# Patient Record
Sex: Female | Born: 1960 | Race: White | Hispanic: No | Marital: Married | Smoking: Never smoker
Health system: Southern US, Community
[De-identification: ages and names within clinical notes are randomized; demographics above are authoritative.]

## PROBLEM LIST (undated history)

## (undated) DIAGNOSIS — F32A Depression, unspecified: Secondary | ICD-10-CM

## (undated) DIAGNOSIS — R519 Headache, unspecified: Secondary | ICD-10-CM

## (undated) DIAGNOSIS — F329 Major depressive disorder, single episode, unspecified: Secondary | ICD-10-CM

## (undated) DIAGNOSIS — B019 Varicella without complication: Secondary | ICD-10-CM

## (undated) DIAGNOSIS — F419 Anxiety disorder, unspecified: Secondary | ICD-10-CM

## (undated) DIAGNOSIS — I10 Essential (primary) hypertension: Secondary | ICD-10-CM

## (undated) DIAGNOSIS — K921 Melena: Secondary | ICD-10-CM

## (undated) DIAGNOSIS — M1990 Unspecified osteoarthritis, unspecified site: Secondary | ICD-10-CM

## (undated) DIAGNOSIS — T7840XA Allergy, unspecified, initial encounter: Secondary | ICD-10-CM

## (undated) DIAGNOSIS — R32 Unspecified urinary incontinence: Secondary | ICD-10-CM

## (undated) DIAGNOSIS — E785 Hyperlipidemia, unspecified: Secondary | ICD-10-CM

## (undated) DIAGNOSIS — R51 Headache: Secondary | ICD-10-CM

## (undated) DIAGNOSIS — R112 Nausea with vomiting, unspecified: Secondary | ICD-10-CM

## (undated) DIAGNOSIS — T8859XA Other complications of anesthesia, initial encounter: Secondary | ICD-10-CM

## (undated) HISTORY — DX: Headache, unspecified: R51.9

## (undated) HISTORY — PX: LEEP: SHX91

## (undated) HISTORY — PX: BREAST EXCISIONAL BIOPSY: SUR124

## (undated) HISTORY — DX: Unspecified urinary incontinence: R32

## (undated) HISTORY — DX: Unspecified osteoarthritis, unspecified site: M19.90

## (undated) HISTORY — DX: Hyperlipidemia, unspecified: E78.5

## (undated) HISTORY — DX: Essential (primary) hypertension: I10

## (undated) HISTORY — DX: Melena: K92.1

## (undated) HISTORY — DX: Anxiety disorder, unspecified: F41.9

## (undated) HISTORY — DX: Allergy, unspecified, initial encounter: T78.40XA

## (undated) HISTORY — PX: WRIST SURGERY: SHX841

## (undated) HISTORY — PX: TONSILLECTOMY AND ADENOIDECTOMY: SUR1326

## (undated) HISTORY — PX: COLONOSCOPY: SHX174

## (undated) HISTORY — DX: Depression, unspecified: F32.A

## (undated) HISTORY — DX: Varicella without complication: B01.9

## (undated) HISTORY — DX: Major depressive disorder, single episode, unspecified: F32.9

## (undated) HISTORY — DX: Headache: R51

---

## 2005-02-15 HISTORY — PX: BREAST BIOPSY: SHX20

## 2014-12-20 ENCOUNTER — Encounter: Payer: Self-pay | Admitting: Internal Medicine

## 2014-12-20 ENCOUNTER — Ambulatory Visit (INDEPENDENT_AMBULATORY_CARE_PROVIDER_SITE_OTHER): Payer: Managed Care, Other (non HMO) | Admitting: Internal Medicine

## 2014-12-20 VITALS — BP 136/80 | HR 69 | Temp 98.2°F | Ht 64.25 in | Wt 160.0 lb

## 2014-12-20 DIAGNOSIS — F418 Other specified anxiety disorders: Secondary | ICD-10-CM

## 2014-12-20 DIAGNOSIS — Z0000 Encounter for general adult medical examination without abnormal findings: Secondary | ICD-10-CM

## 2014-12-20 DIAGNOSIS — R51 Headache: Secondary | ICD-10-CM | POA: Diagnosis not present

## 2014-12-20 DIAGNOSIS — G44209 Tension-type headache, unspecified, not intractable: Secondary | ICD-10-CM | POA: Insufficient documentation

## 2014-12-20 DIAGNOSIS — R519 Headache, unspecified: Secondary | ICD-10-CM

## 2014-12-20 DIAGNOSIS — R5383 Other fatigue: Secondary | ICD-10-CM

## 2014-12-20 DIAGNOSIS — F419 Anxiety disorder, unspecified: Secondary | ICD-10-CM

## 2014-12-20 DIAGNOSIS — F32A Depression, unspecified: Secondary | ICD-10-CM

## 2014-12-20 DIAGNOSIS — R32 Unspecified urinary incontinence: Secondary | ICD-10-CM

## 2014-12-20 DIAGNOSIS — F329 Major depressive disorder, single episode, unspecified: Secondary | ICD-10-CM

## 2014-12-20 DIAGNOSIS — E782 Mixed hyperlipidemia: Secondary | ICD-10-CM | POA: Insufficient documentation

## 2014-12-20 DIAGNOSIS — J302 Other seasonal allergic rhinitis: Secondary | ICD-10-CM

## 2014-12-20 DIAGNOSIS — E785 Hyperlipidemia, unspecified: Secondary | ICD-10-CM | POA: Diagnosis not present

## 2014-12-20 NOTE — Assessment & Plan Note (Signed)
Seems to be flaring now Advised her to go ahead and start her Nasonex, Allegra and Singulair

## 2014-12-20 NOTE — Assessment & Plan Note (Signed)
Seem to be sinus related Advised her to go ahead and start taking her Nasonex, Allegra and Singulair

## 2014-12-20 NOTE — Assessment & Plan Note (Signed)
Will repeat Lipid profile and CMET today Handout given on low fat, low cholesterol diet

## 2014-12-20 NOTE — Assessment & Plan Note (Signed)
Chronic but stable Will check CMET today Support offered

## 2014-12-20 NOTE — Progress Notes (Signed)
Pre visit review using our clinic review tool, if applicable. No additional management support is needed unless otherwise documented below in the visit note. 

## 2014-12-20 NOTE — Assessment & Plan Note (Signed)
>>  ASSESSMENT AND PLAN FOR FREQUENT HEADACHES WRITTEN ON 12/20/2014 12:38 PM BY Lorre Munroe, NP  Seem to be sinus related Advised her to go ahead and start taking her Nasonex, Allegra and Singulair

## 2014-12-20 NOTE — Patient Instructions (Signed)
Fat and Cholesterol Control Diet Fat and cholesterol levels in your blood and organs are influenced by your diet. High levels of fat and cholesterol may lead to diseases of the heart, small and large blood vessels, gallbladder, liver, and pancreas. CONTROLLING FAT AND CHOLESTEROL WITH DIET Although exercise and lifestyle factors are important, your diet is key. That is because certain foods are known to raise cholesterol and others to lower it. The goal is to balance foods for their effect on cholesterol and more importantly, to replace saturated and trans fat with other types of fat, such as monounsaturated fat, polyunsaturated fat, and omega-3 fatty acids. On average, a person should consume no more than 15 to 17 g of saturated fat daily. Saturated and trans fats are considered "bad" fats, and they will raise LDL cholesterol. Saturated fats are primarily found in animal products such as meats, butter, and cream. However, that does not mean you need to give up all your favorite foods. Today, there are good tasting, low-fat, low-cholesterol substitutes for most of the things you like to eat. Choose low-fat or nonfat alternatives. Choose round or loin cuts of red meat. These types of cuts are lowest in fat and cholesterol. Chicken (without the skin), fish, veal, and ground turkey breast are great choices. Eliminate fatty meats, such as hot dogs and salami. Even shellfish have little or no saturated fat. Have a 3 oz (85 g) portion when you eat lean meat, poultry, or fish. Trans fats are also called "partially hydrogenated oils." They are oils that have been scientifically manipulated so that they are solid at room temperature resulting in a longer shelf life and improved taste and texture of foods in which they are added. Trans fats are found in stick margarine, some tub margarines, cookies, crackers, and baked goods.  When baking and cooking, oils are a great substitute for butter. The monounsaturated oils are  especially beneficial since it is believed they lower LDL and raise HDL. The oils you should avoid entirely are saturated tropical oils, such as coconut and palm.  Remember to eat a lot from food groups that are naturally free of saturated and trans fat, including fish, fruit, vegetables, beans, grains (barley, rice, couscous, bulgur wheat), and pasta (without cream sauces).  IDENTIFYING FOODS THAT LOWER FAT AND CHOLESTEROL  Soluble fiber may lower your cholesterol. This type of fiber is found in fruits such as apples, vegetables such as broccoli, potatoes, and carrots, legumes such as beans, peas, and lentils, and grains such as barley. Foods fortified with plant sterols (phytosterol) may also lower cholesterol. You should eat at least 2 g per day of these foods for a cholesterol lowering effect.  Read package labels to identify low-saturated fats, trans fat free, and low-fat foods at the supermarket. Select cheeses that have only 2 to 3 g saturated fat per ounce. Use a heart-healthy tub margarine that is free of trans fats or partially hydrogenated oil. When buying baked goods (cookies, crackers), avoid partially hydrogenated oils. Breads and muffins should be made from whole grains (whole-wheat or whole oat flour, instead of "flour" or "enriched flour"). Buy non-creamy canned soups with reduced salt and no added fats.  FOOD PREPARATION TECHNIQUES  Never deep-fry. If you must fry, either stir-fry, which uses very little fat, or use non-stick cooking sprays. When possible, broil, bake, or roast meats, and steam vegetables. Instead of putting butter or margarine on vegetables, use lemon and herbs, applesauce, and cinnamon (for squash and sweet potatoes). Use nonfat   yogurt, salsa, and low-fat dressings for salads.  LOW-SATURATED FAT / LOW-FAT FOOD SUBSTITUTES Meats / Saturated Fat (g)  Avoid: Steak, marbled (3 oz/85 g) / 11 g  Choose: Steak, lean (3 oz/85 g) / 4 g  Avoid: Hamburger (3 oz/85 g) / 7  g  Choose: Hamburger, lean (3 oz/85 g) / 5 g  Avoid: Ham (3 oz/85 g) / 6 g  Choose: Ham, lean cut (3 oz/85 g) / 2.4 g  Avoid: Chicken, with skin, dark meat (3 oz/85 g) / 4 g  Choose: Chicken, skin removed, dark meat (3 oz/85 g) / 2 g  Avoid: Chicken, with skin, light meat (3 oz/85 g) / 2.5 g  Choose: Chicken, skin removed, light meat (3 oz/85 g) / 1 g Dairy / Saturated Fat (g)  Avoid: Whole milk (1 cup) / 5 g  Choose: Low-fat milk, 2% (1 cup) / 3 g  Choose: Low-fat milk, 1% (1 cup) / 1.5 g  Choose: Skim milk (1 cup) / 0.3 g  Avoid: Hard cheese (1 oz/28 g) / 6 g  Choose: Skim milk cheese (1 oz/28 g) / 2 to 3 g  Avoid: Cottage cheese, 4% fat (1 cup) / 6.5 g  Choose: Low-fat cottage cheese, 1% fat (1 cup) / 1.5 g  Avoid: Ice cream (1 cup) / 9 g  Choose: Sherbet (1 cup) / 2.5 g  Choose: Nonfat frozen yogurt (1 cup) / 0.3 g  Choose: Frozen fruit bar / trace  Avoid: Whipped cream (1 tbs) / 3.5 g  Choose: Nondairy whipped topping (1 tbs) / 1 g Condiments / Saturated Fat (g)  Avoid: Mayonnaise (1 tbs) / 2 g  Choose: Low-fat mayonnaise (1 tbs) / 1 g  Avoid: Butter (1 tbs) / 7 g  Choose: Extra light margarine (1 tbs) / 1 g  Avoid: Coconut oil (1 tbs) / 11.8 g  Choose: Olive oil (1 tbs) / 1.8 g  Choose: Corn oil (1 tbs) / 1.7 g  Choose: Safflower oil (1 tbs) / 1.2 g  Choose: Sunflower oil (1 tbs) / 1.4 g  Choose: Soybean oil (1 tbs) / 2.4 g  Choose: Canola oil (1 tbs) / 1 g Document Released: 09/03/2005 Document Revised: 12/29/2012 Document Reviewed: 12/02/2013 [employer] Patient Information 2015 [employer]. This information is not intended to replace advice given to you by your health care provider. Make sure you discuss any questions you have with your health care provider.  

## 2014-12-20 NOTE — Assessment & Plan Note (Signed)
Continue Ditropan for now

## 2014-12-20 NOTE — Progress Notes (Signed)
HPI  Pt presents to the clinic today to establish care and for management of the conditions listed below. She recently moved from [place]. She is transferring her care from [place].  Seasonal Allergies: Worse in the Spring and Fall. She does take Allegra, Nasonex and Singulair but only when needed.  Frequent Headaches: Occuring almost daily right now. She thinks it is sinus related. The headache is located in her forehead and behind her eyes. She describes it as pressure. She denies blurred vision, dizziness, nausea or sensitivity to light or sound. She takes Ibuprofen and her allergy medications with good relief.  Urine Incontinence: Controlled taking Ditropan daily.  Anxiety and Depression. Chronic but stable. She takes Zoloft daily. It does take the edge off, and keeps her from having crying spells over little things. Denies SI/HI.  HLD: She denies myalgias on Zocor. She does try to consume a low fat diet.  Flu: never Tetanus: 2009 Pap Smear: 03/2014- normal Mammogram: 02/2014 Colon Screening: 03/2012- Internal hemorrhoids Vision Screening: 11/2013 Dentist: biannually  Past Medical History  Diagnosis Date  . Allergy   . Chicken pox   . Blood in stool   . Frequent headaches   . Urine incontinence     Current Outpatient Prescriptions  Medication Sig Dispense Refill  . mometasone (NASONEX) 50 MCG/ACT nasal spray Place 2 sprays into the nose daily.    . montelukast (SINGULAIR) 10 MG tablet Take 10 mg by mouth at bedtime.     Marland Kitchen oxybutynin (DITROPAN-XL) 10 MG 24 hr tablet Take 10 mg by mouth at bedtime.     . sertraline (ZOLOFT) 50 MG tablet Take 50 mg by mouth at bedtime.     . simvastatin (ZOCOR) 20 MG tablet Take 20 mg by mouth daily at 6 PM.      No current facility-administered medications for this visit.    No Known Allergies  Family History  Problem Relation Age of Onset  . Arthritis Mother   . Hyperlipidemia Mother   . Hypertension Mother   . Cancer Father      Lung  . Cancer Maternal Uncle     Prostate  . Rheum arthritis Maternal Grandmother   . Stroke Maternal Grandmother   . Alcohol abuse Maternal Grandfather     History   Social History  . Marital Status: Single    Spouse Name: N/A  . Number of Children: N/A  . Years of Education: N/A   Occupational History  . Not on file.   Social History Main Topics  . Smoking status: Never Smoker   . Smokeless tobacco: Not on file  . Alcohol Use: 0.0 oz/week    0 Standard drinks or equivalent per week     Comment: occasional  . Drug Use: Not on file  . Sexual Activity: Not on file   Other Topics Concern  . Not on file   Social History Narrative  . No narrative on file    ROS:  Constitutional: Pt reports fatigue. Denies fever, malaise, headache or abrupt weight changes.  HEENT: Pt reports runny nose. Denies eye pain, eye redness, ear pain, ringing in the ears, wax buildup, nasal congestion, bloody nose, or sore throat. Respiratory: Denies difficulty breathing, shortness of breath, cough or sputum production.   Cardiovascular: Denies chest pain, chest tightness, palpitations or swelling in the hands or feet.  Gastrointestinal: Denies abdominal pain, bloating, constipation, diarrhea or blood in the stool.  GU: Pt reports urinary incontinence. Denies frequency, urgency, pain with  urination, blood in urine, odor or discharge. Musculoskeletal: Denies decrease in range of motion, difficulty with gait, muscle pain or joint pain and swelling.  Skin: Denies redness, rashes, lesions or ulcercations.  Neurological: Denies dizziness, difficulty with memory, difficulty with speech or problems with balance and coordination.  Psych: Pt reports anxiety and depression. Denies SI/HI.  No other specific complaints in a complete review of systems (except as listed in HPI above).  PE:  BP 136/80 mmHg  Pulse 69  Temp(Src) 98.2 F (36.8 C) (Oral)  Ht 5' 4.25" (1.632 m)  Wt 160 lb (72.576 kg)   BMI 27.25 kg/m2  SpO2 98%  LMP  (LMP Unknown) Wt Readings from Last 3 Encounters:  12/20/14 160 lb (72.576 kg)    General: Appears her stated age, well developed, well nourished in NAD. HEENT: Head: normal shape and size; Eyes: sclera white, no icterus, conjunctiva pink; Nose: mucosa boggy and moist, septum midline; Throat/Mouth: Teeth present, mucosa pink and moist, + PND, no lesions or ulcerations noted.  Neck: Neck supple, trachea midline. No masses, lumps or thyromegaly present.  Cardiovascular: Normal rate and rhythm. S1,S2 noted.  No murmur, rubs or gallops noted. No JVD or BLE edema. No carotid bruits noted. Pulmonary/Chest: Normal effort and positive vesicular breath sounds. No respiratory distress. No wheezes, rales or ronchi noted.  Neurological: Alert and oriented. Psychiatric: Mood anxious appearing but affect normal. Behavior is normal. Judgment and thought content normal.    Assessment and Plan:  Fatigue:  Will check basic labs including a TSH  RTC in 2 months for your physical exam

## 2014-12-21 LAB — CBC WITH DIFFERENTIAL/PLATELET
BASOS ABS: 0 10*3/uL (ref 0.0–0.1)
BASOS PCT: 1 % (ref 0–1)
Eosinophils Absolute: 0.2 10*3/uL (ref 0.0–0.7)
Eosinophils Relative: 5 % (ref 0–5)
HEMATOCRIT: 38.9 % (ref 36.0–46.0)
Hemoglobin: 12.9 g/dL (ref 12.0–15.0)
LYMPHS PCT: 30 % (ref 12–46)
Lymphs Abs: 1.2 10*3/uL (ref 0.7–4.0)
MCH: 30.1 pg (ref 26.0–34.0)
MCHC: 33.2 g/dL (ref 30.0–36.0)
MCV: 90.7 fL (ref 78.0–100.0)
MONO ABS: 0.3 10*3/uL (ref 0.1–1.0)
MPV: 9.8 fL (ref 8.6–12.4)
Monocytes Relative: 8 % (ref 3–12)
Neutro Abs: 2.3 10*3/uL (ref 1.7–7.7)
Neutrophils Relative %: 56 % (ref 43–77)
Platelets: 280 10*3/uL (ref 150–400)
RBC: 4.29 MIL/uL (ref 3.87–5.11)
RDW: 13.1 % (ref 11.5–15.5)
WBC: 4.1 10*3/uL (ref 4.0–10.5)

## 2014-12-21 LAB — COMPREHENSIVE METABOLIC PANEL
ALBUMIN: 4.4 g/dL (ref 3.5–5.2)
ALK PHOS: 47 U/L (ref 39–117)
ALT: 21 U/L (ref 0–35)
AST: 27 U/L (ref 0–37)
BUN: 12 mg/dL (ref 6–23)
CHLORIDE: 105 meq/L (ref 96–112)
CO2: 28 meq/L (ref 19–32)
CREATININE: 0.69 mg/dL (ref 0.50–1.10)
Calcium: 9.1 mg/dL (ref 8.4–10.5)
Glucose, Bld: 75 mg/dL (ref 70–99)
POTASSIUM: 4.2 meq/L (ref 3.5–5.3)
Sodium: 141 mEq/L (ref 135–145)
TOTAL PROTEIN: 6.6 g/dL (ref 6.0–8.3)
Total Bilirubin: 0.5 mg/dL (ref 0.2–1.2)

## 2014-12-21 LAB — TSH: TSH: 3.519 u[IU]/mL (ref 0.350–4.500)

## 2014-12-21 LAB — LIPID PANEL
CHOLESTEROL: 177 mg/dL (ref 0–200)
HDL: 56 mg/dL (ref >=46)
LDL CALC: 97 mg/dL (ref 0–99)
TRIGLYCERIDES: 119 mg/dL (ref <150)
Total CHOL/HDL Ratio: 3.2 Ratio
VLDL: 24 mg/dL (ref 0–40)

## 2015-02-15 ENCOUNTER — Other Ambulatory Visit: Payer: Self-pay | Admitting: Internal Medicine

## 2015-02-16 ENCOUNTER — Other Ambulatory Visit: Payer: Managed Care, Other (non HMO)

## 2015-02-21 ENCOUNTER — Ambulatory Visit (INDEPENDENT_AMBULATORY_CARE_PROVIDER_SITE_OTHER): Payer: Managed Care, Other (non HMO) | Admitting: Internal Medicine

## 2015-02-21 ENCOUNTER — Encounter: Payer: Self-pay | Admitting: Internal Medicine

## 2015-02-21 VITALS — BP 132/86 | HR 63 | Temp 98.1°F | Ht 64.25 in | Wt 149.0 lb

## 2015-02-21 DIAGNOSIS — Z1239 Encounter for other screening for malignant neoplasm of breast: Secondary | ICD-10-CM

## 2015-02-21 DIAGNOSIS — Z0000 Encounter for general adult medical examination without abnormal findings: Secondary | ICD-10-CM

## 2015-02-21 NOTE — Progress Notes (Signed)
Subjective:    Patient ID: Pamela Francis, female    DOB: 29-Mar-1961, 54 y.o.   MRN: [id number]  HPI  Pt presents to the clinic today for her annual exam.  Flu: never Tetanus: 2009 Pap Smear: 03/2014- normal Mammogram: 02/2014 Colon Screening: 03/2012- Internal hemorrhoids Vision Screening: 11/2014 Dentist: biannually  Diet: She consumes a low fat diet, low salt. She tries to consume fruits and veggies. She does eat out occasionally. Exercise: None  Review of Systems      Past Medical History  Diagnosis Date  . Allergy   . Chicken pox   . Blood in stool   . Frequent headaches   . Urine incontinence     Current Outpatient Prescriptions  Medication Sig Dispense Refill  . mometasone (NASONEX) 50 MCG/ACT nasal spray Place 2 sprays into the nose daily.    . montelukast (SINGULAIR) 10 MG tablet Take 10 mg by mouth at bedtime.     Marland Kitchen oxybutynin (DITROPAN-XL) 10 MG 24 hr tablet Take 10 mg by mouth at bedtime.     . sertraline (ZOLOFT) 50 MG tablet Take 50 mg by mouth at bedtime.     . simvastatin (ZOCOR) 20 MG tablet Take 20 mg by mouth daily at 6 PM.      No current facility-administered medications for this visit.    No Known Allergies  Family History  Problem Relation Age of Onset  . Arthritis Mother   . Hyperlipidemia Mother   . Hypertension Mother   . Cancer Father     Lung  . Cancer Maternal Uncle     Prostate  . Rheum arthritis Maternal Grandmother   . Stroke Maternal Grandmother   . Alcohol abuse Maternal Grandfather     History   Social History  . Marital Status: Single    Spouse Name: N/A  . Number of Children: N/A  . Years of Education: N/A   Occupational History  . Not on file.   Social History Main Topics  . Smoking status: Never Smoker   . Smokeless tobacco: Not on file  . Alcohol Use: 0.0 oz/week    0 Standard drinks or equivalent per week     Comment: occasional  . Drug Use: No  . Sexual Activity: Yes   Other Topics Concern  . Not  on file   Social History Narrative  . No narrative on file     Constitutional: Denies fever, malaise, fatigue, headache or abrupt weight changes.  HEENT: Denies eye pain, eye redness, ear pain, ringing in the ears, wax buildup, runny nose, nasal congestion, bloody nose, or sore throat. Respiratory: Denies difficulty breathing, shortness of breath, cough or sputum production.   Cardiovascular: Denies chest pain, chest tightness, palpitations or swelling in the hands or feet.  Gastrointestinal: Pt reports alternating constipation and diarrhea. Denies abdominal pain, bloating, or blood in the stool.  GU: Pt reports urine leakage with coughing/sneezing. Denies urgency, frequency, pain with urination, burning sensation, blood in urine, odor or discharge. Musculoskeletal: Pt reports occasional neck pain.Denies decrease in range of motion, difficulty with gait, muscle pain or joint swelling.  Skin: Denies redness, rashes, lesions or ulcercations.  Neurological: Denies dizziness, difficulty with memory, difficulty with speech or problems with balance and coordination.  Psych: Denies anxiety, depression, SI/HI.  No other specific complaints in a complete review of systems (except as listed in HPI above).  Objective:   Physical Exam  BP 132/86 mmHg  Pulse 63  Temp(Src) 98.1 F (36.7 C) (  Oral)  Ht 5' 4.25" (1.632 m)  Wt 149 lb (67.586 kg)  BMI 25.38 kg/m2  SpO2 99%  LMP  (LMP Unknown) Wt Readings from Last 3 Encounters:  02/21/15 149 lb (67.586 kg)  12/20/14 160 lb (72.576 kg)    General: Appears her stated age, well developed, well nourished in NAD. Skin: Warm, dry and intact. No rashes, lesions or ulcerations noted. HEENT: Head: normal shape and size; Eyes: sclera white, no icterus, conjunctiva pink, PERRLA and EOMs intact; Ears: Tm's gray and intact, normal light reflex; Throat/Mouth: Teeth present, mucosa pink and moist, no exudate, lesions or ulcerations noted.  Neck: Neck supple,  trachea midline. No masses, lumps or thyromegaly present.  Cardiovascular: Normal rate and rhythm. S1,S2 noted.  No murmur, rubs or gallops noted. No JVD or BLE edema. No carotid bruits noted. Pulmonary/Chest: Normal effort and positive vesicular breath sounds. No respiratory distress. No wheezes, rales or ronchi noted.  Abdomen: Soft and nontender. Normal bowel sounds, no bruits noted. No distention or masses noted. Liver, spleen and kidneys non palpable. Musculoskeletal: Strength 5/5 BUE/BLE. No signs of joint swelling. No difficulty with gait.  Neurological: Alert and oriented. Cranial nerves II-XII grossly intact. Coordination normal.  Psychiatric: Mood and affect normal. Behavior is normal. Judgment and thought content normal.    BMET    Component Value Date/Time   NA 141 12/20/2014 1019   K 4.2 12/20/2014 1019   CL 105 12/20/2014 1019   CO2 28 12/20/2014 1019   GLUCOSE 75 12/20/2014 1019   BUN 12 12/20/2014 1019   CREATININE 0.69 12/20/2014 1019   CALCIUM 9.1 12/20/2014 1019    Lipid Panel     Component Value Date/Time   CHOL 177 12/20/2014 1019   TRIG 119 12/20/2014 1019   HDL 56 12/20/2014 1019   CHOLHDL 3.2 12/20/2014 1019   VLDL 24 12/20/2014 1019   LDLCALC 97 12/20/2014 1019    CBC    Component Value Date/Time   WBC 4.1 12/20/2014 1019   RBC 4.29 12/20/2014 1019   HGB 12.9 12/20/2014 1019   HCT 38.9 12/20/2014 1019   PLT 280 12/20/2014 1019   MCV 90.7 12/20/2014 1019   MCH 30.1 12/20/2014 1019   MCHC 33.2 12/20/2014 1019   RDW 13.1 12/20/2014 1019   LYMPHSABS 1.2 12/20/2014 1019   MONOABS 0.3 12/20/2014 1019   EOSABS 0.2 12/20/2014 1019   BASOSABS 0.0 12/20/2014 1019    Hgb A1C No results found for: HGBA1C       Assessment & Plan:   Preventative Health Maintenance:  She declines flu shot Tetanus UTD Pap smear due in 2018 Will order Mammogram, she will call [facility] to schedule Colonoscopy UTD Advised her to schedule an appt for a yearly eye  exam Continue to see a dentist biannually CBC, CMET and Lipid Profile reviewed from 12/2014- all normal  RTC in 6 months to follow up chronic condtions

## 2015-02-21 NOTE — Progress Notes (Signed)
Pre visit review using our clinic review tool, if applicable. No additional management support is needed unless otherwise documented below in the visit note. 

## 2015-02-21 NOTE — Patient Instructions (Addendum)
I have ordered your Mammogram, please call to schedule All of your labs look good! Schedule an appointment for a yearly eye exam  Health Maintenance Adopting a healthy lifestyle and getting preventive care can go a long way to promote health and wellness. Talk with your health care provider about what schedule of regular examinations is right for you. This is a good chance for you to check in with your provider about disease prevention and staying healthy. In between checkups, there are plenty of things you can do on your own. Experts have done a lot of research about which lifestyle changes and preventive measures are most likely to keep you healthy. Ask your health care provider for more information. WEIGHT AND DIET  Eat a healthy diet  Be sure to include plenty of vegetables, fruits, low-fat dairy products, and lean protein.  Do not eat a lot of foods high in solid fats, added sugars, or salt.  Get regular exercise. This is one of the most important things you can do for your health.  Most adults should exercise for at least 150 minutes each week. The exercise should increase your heart rate and make you sweat (moderate-intensity exercise).  Most adults should also do strengthening exercises at least twice a week. This is in addition to the moderate-intensity exercise.  Maintain a healthy weight  Body mass index (BMI) is a measurement that can be used to identify possible weight problems. It estimates body fat based on height and weight. Your health care provider can help determine your BMI and help you achieve or maintain a healthy weight.  For females 39 years of age and older:   A BMI below 18.5 is considered underweight.  A BMI of 18.5 to 24.9 is normal.  A BMI of 25 to 29.9 is considered overweight.  A BMI of 30 and above is considered obese.  Watch levels of cholesterol and blood lipids  You should start having your blood tested for lipids and cholesterol at 52 years of  age, then have this test every 5 years.  You may need to have your cholesterol levels checked more often if:  Your lipid or cholesterol levels are high.  You are older than 52 years of age.  You are at high risk for heart disease.  CANCER SCREENING   Lung Cancer  Lung cancer screening is recommended for adults 70-45 years old who are at high risk for lung cancer because of a history of smoking.  A yearly low-dose CT scan of the lungs is recommended for people who:  Currently smoke.  Have quit within the past 15 years.  Have at least a 30-pack-year history of smoking. A pack year is smoking an average of one pack of cigarettes a day for 1 year.  Yearly screening should continue until it has been 15 years since you quit.  Yearly screening should stop if you develop a health problem that would prevent you from having lung cancer treatment.  Breast Cancer  Practice breast self-awareness. This means understanding how your breasts normally appear and feel.  It also means doing regular breast self-exams. Let your health care provider know about any changes, no matter how small.  If you are in your 20s or 30s, you should have a clinical breast exam (CBE) by a health care provider every 1-3 years as part of a regular health exam.  If you are 78 or older, have a CBE every year. Also consider having a breast X-ray (mammogram) every  year.  If you have a family history of breast cancer, talk to your health care provider about genetic screening.  If you are at high risk for breast cancer, talk to your health care provider about having an MRI and a mammogram every year.  Breast cancer gene (BRCA) assessment is recommended for women who have family members with BRCA-related cancers. BRCA-related cancers include:  Breast.  Ovarian.  Tubal.  Peritoneal cancers.  Results of the assessment will determine the need for genetic counseling and BRCA1 and BRCA2 testing. Cervical  Cancer Routine pelvic examinations to screen for cervical cancer are no longer recommended for nonpregnant women who are considered low risk for cancer of the pelvic organs (ovaries, uterus, and vagina) and who do not have symptoms. A pelvic examination may be necessary if you have symptoms including those associated with pelvic infections. Ask your health care provider if a screening pelvic exam is right for you.   The Pap test is the screening test for cervical cancer for women who are considered at risk.  If you had a hysterectomy for a problem that was not cancer or a condition that could lead to cancer, then you no longer need Pap tests.  If you are older than 65 years, and you have had normal Pap tests for the past 10 years, you no longer need to have Pap tests.  If you have had past treatment for cervical cancer or a condition that could lead to cancer, you need Pap tests and screening for cancer for at least 20 years after your treatment.  If you no longer get a Pap test, assess your risk factors if they change (such as having a new sexual partner). This can affect whether you should start being screened again.  Some women have medical problems that increase their chance of getting cervical cancer. If this is the case for you, your health care provider may recommend more frequent screening and Pap tests.  The human papillomavirus (HPV) test is another test that may be used for cervical cancer screening. The HPV test looks for the virus that can cause cell changes in the cervix. The cells collected during the Pap test can be tested for HPV.  The HPV test can be used to screen women 30 years of age and older. Getting tested for HPV can extend the interval between normal Pap tests from three to five years.  An HPV test also should be used to screen women of any age who have unclear Pap test results.  After 52 years of age, women should have HPV testing as often as Pap tests.  Colorectal  Cancer  This type of cancer can be detected and often prevented.  Routine colorectal cancer screening usually begins at 52 years of age and continues through 52 years of age.  Your health care provider may recommend screening at an earlier age if you have risk factors for colon cancer.  Your health care provider may also recommend using home test kits to check for hidden blood in the stool.  A small camera at the end of a tube can be used to examine your colon directly (sigmoidoscopy or colonoscopy). This is done to check for the earliest forms of colorectal cancer.  Routine screening usually begins at age 50.  Direct examination of the colon should be repeated every 5-10 years through 52 years of age. However, you may need to be screened more often if early forms of precancerous polyps or small growths are found.   Skin Cancer  Check your skin from head to toe regularly.  Tell your health care provider about any new moles or changes in moles, especially if there is a change in a mole's shape or color.  Also tell your health care provider if you have a mole that is larger than the size of a pencil eraser.  Always use sunscreen. Apply sunscreen liberally and repeatedly throughout the day.  Protect yourself by wearing long sleeves, pants, a wide-brimmed hat, and sunglasses whenever you are outside. HEART DISEASE, DIABETES, AND HIGH BLOOD PRESSURE   Have your blood pressure checked at least every 1-2 years. High blood pressure causes heart disease and increases the risk of stroke.  If you are between 78 years and 46 years old, ask your health care provider if you should take aspirin to prevent strokes.  Have regular diabetes screenings. This involves taking a blood sample to check your fasting blood sugar level.  If you are at a normal weight and have a low risk for diabetes, have this test once every three years after 52 years of age.  If you are overweight and have a high risk for  diabetes, consider being tested at a younger age or more often. PREVENTING INFECTION  Hepatitis B  If you have a higher risk for hepatitis B, you should be screened for this virus. You are considered at high risk for hepatitis B if:  You were born in a country where hepatitis B is common. Ask your health care provider which countries are considered high risk.  Your parents were born in a high-risk country, and you have not been immunized against hepatitis B (hepatitis B vaccine).  You have HIV or AIDS.  You use needles to inject street drugs.  You live with someone who has hepatitis B.  You have had sex with someone who has hepatitis B.  You get hemodialysis treatment.  You take certain medicines for conditions, including cancer, organ transplantation, and autoimmune conditions. Hepatitis C  Blood testing is recommended for:  Everyone born from [birth year] through 1963.  Anyone with known risk factors for hepatitis C. Sexually transmitted infections (STIs)  You should be screened for sexually transmitted infections (STIs) including gonorrhea and chlamydia if:  You are sexually active and are younger than 52 years of age.  You are older than 52 years of age and your health care provider tells you that you are at risk for this type of infection.  Your sexual activity has changed since you were last screened and you are at an increased risk for chlamydia or gonorrhea. Ask your health care provider if you are at risk.  If you do not have HIV, but are at risk, it may be recommended that you take a prescription medicine daily to prevent HIV infection. This is called pre-exposure prophylaxis (PrEP). You are considered at risk if:  You are sexually active and do not regularly use condoms or know the HIV status of your partner(s).  You take drugs by injection.  You are sexually active with a partner who has HIV. Talk with your health care provider about whether you are at high risk of  being infected with HIV. If you choose to begin PrEP, you should first be tested for HIV. You should then be tested every 3 months for as long as you are taking PrEP.  PREGNANCY   If you are premenopausal and you may become pregnant, ask your health care provider about preconception counseling.  If you  may become pregnant, take 400 to 800 micrograms (mcg) of folic acid every day.  If you want to prevent pregnancy, talk to your health care provider about birth control (contraception). OSTEOPOROSIS AND MENOPAUSE   Osteoporosis is a disease in which the bones lose minerals and strength with aging. This can result in serious bone fractures. Your risk for osteoporosis can be identified using a bone density scan.  If you are 59 years of age or older, or if you are at risk for osteoporosis and fractures, ask your health care provider if you should be screened.  Ask your health care provider whether you should take a calcium or vitamin D supplement to lower your risk for osteoporosis.  Menopause may have certain physical symptoms and risks.  Hormone replacement therapy may reduce some of these symptoms and risks. Talk to your health care provider about whether hormone replacement therapy is right for you.  HOME CARE INSTRUCTIONS   Schedule regular health, dental, and eye exams.  Stay current with your immunizations.   Do not use any tobacco products including cigarettes, chewing tobacco, or electronic cigarettes.  If you are pregnant, do not drink alcohol.  If you are breastfeeding, limit how much and how often you drink alcohol.  Limit alcohol intake to no more than 1 drink per day for nonpregnant women. One drink equals 12 ounces of beer, 5 ounces of wine, or 1 ounces of hard liquor.  Do not use street drugs.  Do not share needles.  Ask your health care provider for help if you need support or information about quitting drugs.  Tell your health care provider if you often feel  depressed.  Tell your health care provider if you have ever been abused or do not feel safe at home. Document Released: 03/19/2011 Document Revised: 01/18/2014 Document Reviewed: 08/05/2013 [facility] Patient Information 2015 [place], [place]. This information is not intended to replace advice given to you by your health care provider. Make sure you discuss any questions you have with your health care provider.

## 2015-02-23 ENCOUNTER — Other Ambulatory Visit: Payer: Self-pay | Admitting: Internal Medicine

## 2015-03-09 ENCOUNTER — Ambulatory Visit
Admission: RE | Admit: 2015-03-09 | Discharge: 2015-03-09 | Disposition: A | Discharge status: Home / Self Care | Payer: Managed Care, Other (non HMO) | Source: Ambulatory Visit | Attending: Internal Medicine | Admitting: Internal Medicine

## 2015-03-09 DIAGNOSIS — Z1231 Encounter for screening mammogram for malignant neoplasm of breast: Secondary | ICD-10-CM | POA: Insufficient documentation

## 2015-03-09 DIAGNOSIS — Z1239 Encounter for other screening for malignant neoplasm of breast: Secondary | ICD-10-CM

## 2015-03-21 ENCOUNTER — Other Ambulatory Visit: Payer: Self-pay | Admitting: Internal Medicine

## 2015-07-07 ENCOUNTER — Other Ambulatory Visit: Payer: Self-pay | Admitting: Internal Medicine

## 2015-08-22 ENCOUNTER — Other Ambulatory Visit: Payer: Managed Care, Other (non HMO)

## 2015-08-24 ENCOUNTER — Ambulatory Visit: Payer: Managed Care, Other (non HMO) | Admitting: Internal Medicine

## 2015-09-10 ENCOUNTER — Other Ambulatory Visit: Payer: Self-pay | Admitting: Internal Medicine

## 2015-09-13 ENCOUNTER — Other Ambulatory Visit: Payer: Self-pay | Admitting: Internal Medicine

## 2015-09-20 ENCOUNTER — Other Ambulatory Visit: Payer: Managed Care, Other (non HMO)

## 2015-09-22 ENCOUNTER — Encounter: Payer: Self-pay | Admitting: Internal Medicine

## 2015-09-22 ENCOUNTER — Ambulatory Visit (INDEPENDENT_AMBULATORY_CARE_PROVIDER_SITE_OTHER): Payer: Managed Care, Other (non HMO) | Admitting: Internal Medicine

## 2015-09-22 ENCOUNTER — Other Ambulatory Visit: Payer: Managed Care, Other (non HMO)

## 2015-09-22 VITALS — BP 122/76 | HR 74 | Temp 98.3°F | Ht 64.25 in | Wt 165.0 lb

## 2015-09-22 DIAGNOSIS — F419 Anxiety disorder, unspecified: Secondary | ICD-10-CM

## 2015-09-22 DIAGNOSIS — R32 Unspecified urinary incontinence: Secondary | ICD-10-CM

## 2015-09-22 DIAGNOSIS — Z0000 Encounter for general adult medical examination without abnormal findings: Secondary | ICD-10-CM

## 2015-09-22 DIAGNOSIS — F329 Major depressive disorder, single episode, unspecified: Secondary | ICD-10-CM

## 2015-09-22 DIAGNOSIS — K648 Other hemorrhoids: Secondary | ICD-10-CM

## 2015-09-22 DIAGNOSIS — J302 Other seasonal allergic rhinitis: Secondary | ICD-10-CM

## 2015-09-22 DIAGNOSIS — E785 Hyperlipidemia, unspecified: Secondary | ICD-10-CM

## 2015-09-22 DIAGNOSIS — R519 Headache, unspecified: Secondary | ICD-10-CM

## 2015-09-22 DIAGNOSIS — R51 Headache: Secondary | ICD-10-CM

## 2015-09-22 DIAGNOSIS — F418 Other specified anxiety disorders: Secondary | ICD-10-CM

## 2015-09-22 LAB — CBC WITH DIFFERENTIAL/PLATELET
Basophils Absolute: 0 10*3/uL (ref 0.0–0.1)
Basophils Relative: 0 % (ref 0–1)
EOS PCT: 3 % (ref 0–5)
Eosinophils Absolute: 0.3 10*3/uL (ref 0.0–0.7)
HEMATOCRIT: 37.2 % (ref 36.0–46.0)
Hemoglobin: 12.6 g/dL (ref 12.0–15.0)
LYMPHS ABS: 1.8 10*3/uL (ref 0.7–4.0)
LYMPHS PCT: 18 % (ref 12–46)
MCH: 30 pg (ref 26.0–34.0)
MCHC: 33.9 g/dL (ref 30.0–36.0)
MCV: 88.6 fL (ref 78.0–100.0)
MONO ABS: 0.6 10*3/uL (ref 0.1–1.0)
MPV: 8.5 fL — ABNORMAL LOW (ref 8.6–12.4)
Monocytes Relative: 6 % (ref 3–12)
Neutro Abs: 7.2 10*3/uL (ref 1.7–7.7)
Neutrophils Relative %: 73 % (ref 43–77)
Platelets: 415 10*3/uL — ABNORMAL HIGH (ref 150–400)
RBC: 4.2 MIL/uL (ref 3.87–5.11)
RDW: 13.4 % (ref 11.5–15.5)
WBC: 9.8 10*3/uL (ref 4.0–10.5)

## 2015-09-22 MED ORDER — HYDROCORTISONE ACETATE 25 MG RE SUPP: 25.0000 mg | Freq: Two times a day (BID) | RECTAL | Status: DC

## 2015-09-22 NOTE — Patient Instructions (Signed)
Health Maintenance, Female Adopting a healthy lifestyle and getting preventive care can go a long way to promote health and wellness. Talk with your health care provider about what schedule of regular examinations is right for you. This is a good chance for you to check in with your provider about disease prevention and staying healthy. In between checkups, there are plenty of things you can do on your own. Experts have done a lot of research about which lifestyle changes and preventive measures are most likely to keep you healthy. Ask your health care provider for more information. WEIGHT AND DIET  Eat a healthy diet  Be sure to include plenty of vegetables, fruits, low-fat dairy products, and lean protein.  Do not eat a lot of foods high in solid fats, added sugars, or salt.  Get regular exercise. This is one of the most important things you can do for your health.  Most adults should exercise for at least 150 minutes each week. The exercise should increase your heart rate and make you sweat (moderate-intensity exercise).  Most adults should also do strengthening exercises at least twice a week. This is in addition to the moderate-intensity exercise.  Maintain a healthy weight  Body mass index (BMI) is a measurement that can be used to identify possible weight problems. It estimates body fat based on height and weight. Your health care provider can help determine your BMI and help you achieve or maintain a healthy weight.  For females 20 years of age and older:   A BMI below 18.5 is considered underweight.  A BMI of 18.5 to 24.9 is normal.  A BMI of 25 to 29.9 is considered overweight.  A BMI of 30 and above is considered obese.  Watch levels of cholesterol and blood lipids  You should start having your blood tested for lipids and cholesterol at 53 years of age, then have this test every 5 years.  You may need to have your cholesterol levels checked more often if:  Your lipid  or cholesterol levels are high.  You are older than 53 years of age.  You are at high risk for heart disease.  CANCER SCREENING   Lung Cancer  Lung cancer screening is recommended for adults 55-80 years old who are at high risk for lung cancer because of a history of smoking.  A yearly low-dose CT scan of the lungs is recommended for people who:  Currently smoke.  Have quit within the past 15 years.  Have at least a 30-pack-year history of smoking. A pack year is smoking an average of one pack of cigarettes a day for 1 year.  Yearly screening should continue until it has been 15 years since you quit.  Yearly screening should stop if you develop a health problem that would prevent you from having lung cancer treatment.  Breast Cancer  Practice breast self-awareness. This means understanding how your breasts normally appear and feel.  It also means doing regular breast self-exams. Let your health care provider know about any changes, no matter how small.  If you are in your 20s or 30s, you should have a clinical breast exam (CBE) by a health care provider every 1-3 years as part of a regular health exam.  If you are 40 or older, have a CBE every year. Also consider having a breast X-ray (mammogram) every year.  If you have a family history of breast cancer, talk to your health care provider about genetic screening.  If you   are at high risk for breast cancer, talk to your health care provider about having an MRI and a mammogram every year.  Breast cancer gene (BRCA) assessment is recommended for women who have family members with BRCA-related cancers. BRCA-related cancers include:  Breast.  Ovarian.  Tubal.  Peritoneal cancers.  Results of the assessment will determine the need for genetic counseling and BRCA1 and BRCA2 testing. Cervical Cancer Your health care provider may recommend that you be screened regularly for cancer of the pelvic organs (ovaries, uterus, and  vagina). This screening involves a pelvic examination, including checking for microscopic changes to the surface of your cervix (Pap test). You may be encouraged to have this screening done every 3 years, beginning at age 21.  For women ages 30-65, health care providers may recommend pelvic exams and Pap testing every 3 years, or they may recommend the Pap and pelvic exam, combined with testing for human papilloma virus (HPV), every 5 years. Some types of HPV increase your risk of cervical cancer. Testing for HPV may also be done on women of any age with unclear Pap test results.  Other health care providers may not recommend any screening for nonpregnant women who are considered low risk for pelvic cancer and who do not have symptoms. Ask your health care provider if a screening pelvic exam is right for you.  If you have had past treatment for cervical cancer or a condition that could lead to cancer, you need Pap tests and screening for cancer for at least 20 years after your treatment. If Pap tests have been discontinued, your risk factors (such as having a new sexual partner) need to be reassessed to determine if screening should resume. Some women have medical problems that increase the chance of getting cervical cancer. In these cases, your health care provider may recommend more frequent screening and Pap tests. Colorectal Cancer  This type of cancer can be detected and often prevented.  Routine colorectal cancer screening usually begins at 53 years of age and continues through 53 years of age.  Your health care provider may recommend screening at an earlier age if you have risk factors for colon cancer.  Your health care provider may also recommend using home test kits to check for hidden blood in the stool.  A small camera at the end of a tube can be used to examine your colon directly (sigmoidoscopy or colonoscopy). This is done to check for the earliest forms of colorectal  cancer.  Routine screening usually begins at age 50.  Direct examination of the colon should be repeated every 5-10 years through 53 years of age. However, you may need to be screened more often if early forms of precancerous polyps or small growths are found. Skin Cancer  Check your skin from head to toe regularly.  Tell your health care provider about any new moles or changes in moles, especially if there is a change in a mole's shape or color.  Also tell your health care provider if you have a mole that is larger than the size of a pencil eraser.  Always use sunscreen. Apply sunscreen liberally and repeatedly throughout the day.  Protect yourself by wearing long sleeves, pants, a wide-brimmed hat, and sunglasses whenever you are outside. HEART DISEASE, DIABETES, AND HIGH BLOOD PRESSURE   High blood pressure causes heart disease and increases the risk of stroke. High blood pressure is more likely to develop in:  People who have blood pressure in the high end   of the normal range (130-139/85-89 mm Hg).  People who are overweight or obese.  People who are African American.  If you are 38-23 years of age, have your blood pressure checked every 3-5 years. If you are 61 years of age or older, have your blood pressure checked every year. You should have your blood pressure measured twice--once when you are at a hospital or clinic, and once when you are not at a hospital or clinic. Record the average of the two measurements. To check your blood pressure when you are not at a hospital or clinic, you can use:  An automated blood pressure machine at a pharmacy.  A home blood pressure monitor.  If you are between 45 years and 39 years old, ask your health care provider if you should take aspirin to prevent strokes.  Have regular diabetes screenings. This involves taking a blood sample to check your fasting blood sugar level.  If you are at a normal weight and have a low risk for diabetes,  have this test once every three years after 53 years of age.  If you are overweight and have a high risk for diabetes, consider being tested at a younger age or more often. PREVENTING INFECTION  Hepatitis B  If you have a higher risk for hepatitis B, you should be screened for this virus. You are considered at high risk for hepatitis B if:  You were born in a country where hepatitis B is common. Ask your health care provider which countries are considered high risk.  Your parents were born in a high-risk country, and you have not been immunized against hepatitis B (hepatitis B vaccine).  You have HIV or AIDS.  You use needles to inject street drugs.  You live with someone who has hepatitis B.  You have had sex with someone who has hepatitis B.  You get hemodialysis treatment.  You take certain medicines for conditions, including cancer, organ transplantation, and autoimmune conditions. Hepatitis C  Blood testing is recommended for:  Everyone born from [birth year] through 1963.  Anyone with known risk factors for hepatitis C. Sexually transmitted infections (STIs)  You should be screened for sexually transmitted infections (STIs) including gonorrhea and chlamydia if:  You are sexually active and are younger than 53 years of age.  You are older than 53 years of age and your health care provider tells you that you are at risk for this type of infection.  Your sexual activity has changed since you were last screened and you are at an increased risk for chlamydia or gonorrhea. Ask your health care provider if you are at risk.  If you do not have HIV, but are at risk, it may be recommended that you take a prescription medicine daily to prevent HIV infection. This is called pre-exposure prophylaxis (PrEP). You are considered at risk if:  You are sexually active and do not regularly use condoms or know the HIV status of your partner(s).  You take drugs by injection.  You are sexually  active with a partner who has HIV. Talk with your health care provider about whether you are at high risk of being infected with HIV. If you choose to begin PrEP, you should first be tested for HIV. You should then be tested every 3 months for as long as you are taking PrEP.  PREGNANCY   If you are premenopausal and you may become pregnant, ask your health care provider about preconception counseling.  If you may  become pregnant, take 400 to 800 micrograms (mcg) of folic acid every day.  If you want to prevent pregnancy, talk to your health care provider about birth control (contraception). OSTEOPOROSIS AND MENOPAUSE   Osteoporosis is a disease in which the bones lose minerals and strength with aging. This can result in serious bone fractures. Your risk for osteoporosis can be identified using a bone density scan.  If you are 61 years of age or older, or if you are at risk for osteoporosis and fractures, ask your health care provider if you should be screened.  Ask your health care provider whether you should take a calcium or vitamin D supplement to lower your risk for osteoporosis.  Menopause may have certain physical symptoms and risks.  Hormone replacement therapy may reduce some of these symptoms and risks. Talk to your health care provider about whether hormone replacement therapy is right for you.  HOME CARE INSTRUCTIONS   Schedule regular health, dental, and eye exams.  Stay current with your immunizations.   Do not use any tobacco products including cigarettes, chewing tobacco, or electronic cigarettes.  If you are pregnant, do not drink alcohol.  If you are breastfeeding, limit how much and how often you drink alcohol.  Limit alcohol intake to no more than 1 drink per day for nonpregnant women. One drink equals 12 ounces of beer, 5 ounces of wine, or 1 ounces of hard liquor.  Do not use street drugs.  Do not share needles.  Ask your health care provider for help if  you need support or information about quitting drugs.  Tell your health care provider if you often feel depressed.  Tell your health care provider if you have ever been abused or do not feel safe at home.   This information is not intended to replace advice given to you by your health care provider. Make sure you discuss any questions you have with your health care provider.   Document Released: 03/19/2011 Document Revised: 09/24/2014 Document Reviewed: 08/05/2013 [employer] Interactive Patient Education [employer].

## 2015-09-22 NOTE — Assessment & Plan Note (Signed)
Allergy/sinus related Continue Nasonex and Singulair

## 2015-09-22 NOTE — Progress Notes (Signed)
Pre visit review using our clinic review tool, if applicable. No additional management support is needed unless otherwise documented below in the visit note. 

## 2015-09-22 NOTE — Progress Notes (Signed)
Subjective:    Patient ID: Pamela Francis, female    DOB: 1960/11/03, 55 y.o.   MRN: [id number]  HPI  Pt presents to the clinic today for her annual exam. She is also due for follow up chronic conditions. She understands that she has a physical exam 6 months ago and that insurance may not cover this visit. She does want to have another annual exam today.  Flu: never Tetanus: 2009 Pap Smear: 03/2014- normal Mammogram: 02/2015- normal Colon Screening: 03/2012- Internal hemorrhoids, flaring up now, requesting RX Vision Screening: 11/2014 Dentist: biannually  Diet: She consumes a low fat diet, low salt. She tries to consume fruits and veggies. She does eat out occasionally. Exercise: None   Seasonal Allergies: Worse in the Spring and Fall. She does take Allegra, Nasonex and Singulair but only when needed. She does feel like her sinuses are flaring up right now. She is not getting any relief with OTC medication.  Frequent Headaches: Occuring almost daily right now. She thinks it is sinus related. The headache is located in her forehead and behind her eyes. She describes it as pressure. She denies blurred vision, dizziness, nausea or sensitivity to light or sound. She takes Ibuprofen and her allergy medications with good relief.  Urine Incontinence: Controlled taking Ditropan daily.  Anxiety and Depression. Chronic but stable. She takes Zoloft daily. It does take the edge off, and keeps her from having crying spells over little things. Denies SI/HI.  HLD: Her last LDL was 97. She denies myalgias on Zocor. She does try to consume a low fat diet.  Review of Systems      Past Medical History  Diagnosis Date  . Allergy   . Chicken pox   . Blood in stool   . Frequent headaches   . Urine incontinence     Current Outpatient Prescriptions  Medication Sig Dispense Refill  . mometasone (NASONEX) 50 MCG/ACT nasal spray Place 2 sprays into the nose daily.    . montelukast (SINGULAIR) 10 MG  tablet TAKE 1 TABLET BY MOUTH EVERY DAY IN THE EVENING 30 90 tablet 0  . oxybutynin (DITROPAN-XL) 10 MG 24 hr tablet TAKE 1 TABLET BY MOUTH EVERY DAY 30 90 tablet 0  . sertraline (ZOLOFT) 50 MG tablet TAKE 1 TABLET ONCE A DAY 90 tablet 0  . simvastatin (ZOCOR) 20 MG tablet TAKE 1 TABLET BY MOUTH IN THE EVENING 30 tablet 9   No current facility-administered medications for this visit.    No Known Allergies  Family History  Problem Relation Age of Onset  . Arthritis Mother   . Hyperlipidemia Mother   . Hypertension Mother   . Cancer Father     Lung  . Cancer Maternal Uncle     Prostate  . Rheum arthritis Maternal Grandmother   . Stroke Maternal Grandmother   . Alcohol abuse Maternal Grandfather     Social History   Social History  . Marital Status: Single    Spouse Name: N/A  . Number of Children: N/A  . Years of Education: N/A   Occupational History  . Not on file.   Social History Main Topics  . Smoking status: Never Smoker   . Smokeless tobacco: Not on file  . Alcohol Use: 0.0 oz/week    0 Standard drinks or equivalent per week     Comment: occasional  . Drug Use: No  . Sexual Activity: Yes   Other Topics Concern  . Not on file   Social  History Narrative     Constitutional: Denies fever, malaise, fatigue, headache or abrupt weight changes.  HEENT: Pt reports runny nose and sore throat. Denies eye pain, eye redness, ear pain, ringing in the ears, wax buildup, nasal congestion, bloody nose. Respiratory: Denies difficulty breathing, shortness of breath, cough or sputum production.   Cardiovascular: Denies chest pain, chest tightness, palpitations or swelling in the hands or feet.  Gastrointestinal: Pt reports alternating constipation and diarrhea. Denies abdominal pain, bloating, or blood in the stool.  GU: Pt reports urine leakage with coughing/sneezing. Denies urgency, frequency, pain with urination, burning sensation, blood in urine, odor or  discharge. Musculoskeletal: Pt reports occasional neck pain. Denies decrease in range of motion, difficulty with gait, muscle pain or joint swelling.  Skin: Denies redness, rashes, lesions or ulcercations.  Neurological: Denies dizziness, difficulty with memory, difficulty with speech or problems with balance and coordination.  Psych: Pt reports chronic anxiety or depression. Denies SI/HI.  No other specific complaints in a complete review of systems (except as listed in HPI above).  Objective:   Physical Exam  BP 122/76 mmHg  Pulse 74  Temp(Src) 98.3 F (36.8 C) (Oral)  Ht 5' 4.25" (1.632 m)  Wt 165 lb (74.844 kg)  BMI 28.10 kg/m2  SpO2 98%  LMP  (LMP Unknown)  Wt Readings from Last 3 Encounters:  02/21/15 149 lb (67.586 kg)  12/20/14 160 lb (72.576 kg)    General: Appears her stated age, well developed, well nourished in NAD. Skin: Warm, dry and intact. No rashes, lesions or ulcerations noted. HEENT: Head: normal shape and size; Eyes: sclera white, no icterus, conjunctiva pink, PERRLA and EOMs intact; Ears: Tm's gray and intact, normal light reflex; Nose: mucosa boggy and moist. Throat/Mouth: Teeth present, mucosa erythematous and moist, no exudate, lesions or ulcerations noted.  Neck: Neck supple, trachea midline. No masses, lumps or thyromegaly present.  Cardiovascular: Normal rate and rhythm. S1,S2 noted.  No murmur, rubs or gallops noted. No JVD or BLE edema. No carotid bruits noted. Pulmonary/Chest: Normal effort and positive vesicular breath sounds. No respiratory distress. No wheezes, rales or ronchi noted.  Abdomen: Soft and nontender. Normal bowel sounds, no bruits noted. No distention or masses noted. Liver, spleen and kidneys non palpable. Musculoskeletal: Strength 5/5 BUE/BLE. No signs of joint swelling. No difficulty with gait.  Neurological: Alert and oriented. Cranial nerves II-XII grossly intact. Coordination normal.  Psychiatric: Mood and affect normal. Behavior  is normal. Judgment and thought content normal.    BMET    Component Value Date/Time   NA 141 12/20/2014 1019   K 4.2 12/20/2014 1019   CL 105 12/20/2014 1019   CO2 28 12/20/2014 1019   GLUCOSE 75 12/20/2014 1019   BUN 12 12/20/2014 1019   CREATININE 0.69 12/20/2014 1019   CALCIUM 9.1 12/20/2014 1019    Lipid Panel     Component Value Date/Time   CHOL 177 12/20/2014 1019   TRIG 119 12/20/2014 1019   HDL 56 12/20/2014 1019   CHOLHDL 3.2 12/20/2014 1019   VLDL 24 12/20/2014 1019   LDLCALC 97 12/20/2014 1019    CBC    Component Value Date/Time   WBC 4.1 12/20/2014 1019   RBC 4.29 12/20/2014 1019   HGB 12.9 12/20/2014 1019   HCT 38.9 12/20/2014 1019   PLT 280 12/20/2014 1019   MCV 90.7 12/20/2014 1019   MCH 30.1 12/20/2014 1019   MCHC 33.2 12/20/2014 1019   RDW 13.1 12/20/2014 1019   LYMPHSABS 1.2  12/20/2014 1019   MONOABS 0.3 12/20/2014 1019   EOSABS 0.2 12/20/2014 1019   BASOSABS 0.0 12/20/2014 1019    Hgb A1C No results found for: HGBA1C       Assessment & Plan:   Preventative Health Maintenance:  She declines flu shot Tetanus UTD Pap smear due in 2018 Mammogram UTD Colonoscopy UTD Advised her to schedule an appt for a yearly eye exam Continue to see a dentist biannually Will check CBC, CMET, Lipid, Hep C and HIV  Today  Internal hemorrhoids:  eRx for Anusol suppositories  RTC in 6 months for followup chronic conditons

## 2015-09-22 NOTE — Assessment & Plan Note (Signed)
>>  ASSESSMENT AND PLAN FOR FREQUENT HEADACHES WRITTEN ON 09/22/2015  2:42 PM BY Lorre Munroe, NP  Allergy/sinus related Continue Nasonex and Singulair

## 2015-09-22 NOTE — Assessment & Plan Note (Signed)
Stable on current dose of Zoloft Support offered today

## 2015-09-22 NOTE — Assessment & Plan Note (Addendum)
Continue Allegra, Singulair and Nasonex 80 mg Depo IM today

## 2015-09-22 NOTE — Assessment & Plan Note (Signed)
Continue Ditropan 

## 2015-09-22 NOTE — Addendum Note (Signed)
Addended by: Ellamae Sia on: 09/22/2015 03:08 PM   Modules accepted: Orders

## 2015-09-22 NOTE — Assessment & Plan Note (Signed)
Will check CMET and Lipid profile today Continue Zocor Encouraged her to consume a low fat diet

## 2015-09-23 LAB — LIPID PANEL
Cholesterol: 158 mg/dL (ref 125–200)
HDL: 56 mg/dL (ref >=46)
LDL CALC: 85 mg/dL (ref <130)
Total CHOL/HDL Ratio: 2.8 Ratio (ref <=5.0)
Triglycerides: 86 mg/dL (ref <150)
VLDL: 17 mg/dL (ref <30)

## 2015-09-23 LAB — COMPREHENSIVE METABOLIC PANEL
ALT: 20 U/L (ref 6–29)
AST: 26 U/L (ref 10–35)
Albumin: 4.3 g/dL (ref 3.6–5.1)
Alkaline Phosphatase: 60 U/L (ref 33–130)
BILIRUBIN TOTAL: 0.3 mg/dL (ref 0.2–1.2)
BUN: 12 mg/dL (ref 7–25)
CHLORIDE: 101 mmol/L (ref 98–110)
CO2: 26 mmol/L (ref 20–31)
CREATININE: 0.78 mg/dL (ref 0.50–1.05)
Calcium: 9.4 mg/dL (ref 8.6–10.4)
Glucose, Bld: 92 mg/dL (ref 65–99)
Potassium: 4.5 mmol/L (ref 3.5–5.3)
SODIUM: 142 mmol/L (ref 135–146)
TOTAL PROTEIN: 7.2 g/dL (ref 6.1–8.1)

## 2015-09-23 LAB — HIV ANTIBODY (ROUTINE TESTING W REFLEX): HIV 1&2 Ab, 4th Generation: NONREACTIVE

## 2015-09-23 LAB — HEPATITIS C ANTIBODY: HCV AB: NEGATIVE

## 2015-09-27 MED ORDER — METHYLPREDNISOLONE ACETATE 80 MG/ML IJ SUSP
80.0000 mg | Freq: Once | INTRAMUSCULAR | Status: AC
  Administered 2015-09-22: 80 mg via INTRAMUSCULAR

## 2015-09-27 NOTE — Addendum Note (Signed)
Addended by: Lurlean Nanny on: 09/27/2015 09:58 AM   Modules accepted: Orders

## 2015-10-11 ENCOUNTER — Other Ambulatory Visit: Payer: Self-pay | Admitting: Internal Medicine

## 2015-10-12 ENCOUNTER — Other Ambulatory Visit: Payer: Self-pay | Admitting: Internal Medicine

## 2016-02-21 ENCOUNTER — Other Ambulatory Visit: Payer: Self-pay | Admitting: Internal Medicine

## 2016-02-21 DIAGNOSIS — Z1231 Encounter for screening mammogram for malignant neoplasm of breast: Secondary | ICD-10-CM

## 2016-03-13 ENCOUNTER — Ambulatory Visit
Admission: RE | Admit: 2016-03-13 | Discharge: 2016-03-13 | Disposition: A | Discharge status: Home / Self Care | Payer: Managed Care, Other (non HMO) | Source: Ambulatory Visit | Attending: Internal Medicine | Admitting: Internal Medicine

## 2016-03-13 ENCOUNTER — Other Ambulatory Visit: Payer: Self-pay | Admitting: Internal Medicine

## 2016-03-13 DIAGNOSIS — Z1231 Encounter for screening mammogram for malignant neoplasm of breast: Secondary | ICD-10-CM | POA: Insufficient documentation

## 2016-03-29 ENCOUNTER — Ambulatory Visit: Payer: Managed Care, Other (non HMO) | Admitting: Internal Medicine

## 2016-04-03 ENCOUNTER — Ambulatory Visit (INDEPENDENT_AMBULATORY_CARE_PROVIDER_SITE_OTHER): Payer: Managed Care, Other (non HMO) | Admitting: Internal Medicine

## 2016-04-03 ENCOUNTER — Encounter: Payer: Self-pay | Admitting: Internal Medicine

## 2016-04-03 VITALS — BP 120/78 | HR 62 | Temp 98.6°F | Wt 170.0 lb

## 2016-04-03 DIAGNOSIS — J302 Other seasonal allergic rhinitis: Secondary | ICD-10-CM | POA: Diagnosis not present

## 2016-04-03 DIAGNOSIS — R7989 Other specified abnormal findings of blood chemistry: Secondary | ICD-10-CM

## 2016-04-03 DIAGNOSIS — D473 Essential (hemorrhagic) thrombocythemia: Secondary | ICD-10-CM

## 2016-04-03 DIAGNOSIS — R519 Headache, unspecified: Secondary | ICD-10-CM

## 2016-04-03 DIAGNOSIS — R32 Unspecified urinary incontinence: Secondary | ICD-10-CM

## 2016-04-03 DIAGNOSIS — F418 Other specified anxiety disorders: Secondary | ICD-10-CM | POA: Diagnosis not present

## 2016-04-03 DIAGNOSIS — F32A Depression, unspecified: Secondary | ICD-10-CM

## 2016-04-03 DIAGNOSIS — R51 Headache: Secondary | ICD-10-CM | POA: Diagnosis not present

## 2016-04-03 DIAGNOSIS — F419 Anxiety disorder, unspecified: Secondary | ICD-10-CM

## 2016-04-03 DIAGNOSIS — E785 Hyperlipidemia, unspecified: Secondary | ICD-10-CM

## 2016-04-03 DIAGNOSIS — F329 Major depressive disorder, single episode, unspecified: Secondary | ICD-10-CM

## 2016-04-03 NOTE — Progress Notes (Signed)
Subjective:    Patient ID: Pamela Francis, female    DOB: 1960/10/26, 55 y.o.   MRN: [id number]  HPI  Pt presents to the clinic today for 6 month follow up of chronic conditions.  Seasonal Allergies: Worse in the Spring and Fall. She does take Allegra, Nasonex and Singulair but only when needed.   Frequent Headaches: Occuring almost daily right now. She thinks it is sinus related. The headache is located in her forehead and behind her eyes. She describes it as pressure. She denies blurred vision, dizziness, nausea or sensitivity to light or sound. She takes Ibuprofen and her allergy medications with good relief.  Urine Incontinence: Controlled taking Ditropan daily.  Anxiety and Depression. Chronic but stable. She takes Zoloft daily. It does take the edge off, and keeps her from having crying spells over little things. Denies SI/HI.  HLD: Her last LDL was 85. She denies myalgias on Zocor. She does try to consume a low fat diet.   Review of Systems      Past Medical History  Diagnosis Date  . Allergy   . Chicken pox   . Blood in stool   . Frequent headaches   . Urine incontinence     Current Outpatient Prescriptions  Medication Sig Dispense Refill  . hydrocortisone (ANUSOL-HC) 25 MG suppository Place 1 suppository (25 mg total) rectally 2 (two) times daily. 12 suppository 0  . mometasone (NASONEX) 50 MCG/ACT nasal spray Place 2 sprays into the nose daily.    . montelukast (SINGULAIR) 10 MG tablet TAKE 1 TABLET BY MOUTH EVERY DAY IN THE EVENING 90 tablet 3  . oxybutynin (DITROPAN-XL) 10 MG 24 hr tablet TAKE 1 TABLET BY MOUTH EVERY DAY 90 tablet 1  . sertraline (ZOLOFT) 50 MG tablet TAKE 1 TABLET ONCE A DAY 90 tablet 3  . simvastatin (ZOCOR) 20 MG tablet TAKE 1 TABLET BY MOUTH IN THE EVENING 30 tablet 9   No current facility-administered medications for this visit.    No Known Allergies  Family History  Problem Relation Age of Onset  . Arthritis Mother   .  Hyperlipidemia Mother   . Hypertension Mother   . Cancer Father     Lung  . Cancer Maternal Uncle     Prostate  . Rheum arthritis Maternal Grandmother   . Stroke Maternal Grandmother   . Alcohol abuse Maternal Grandfather   . Breast cancer Neg Hx     Social History   Social History  . Marital Status: Single    Spouse Name: N/A  . Number of Children: N/A  . Years of Education: N/A   Occupational History  . Not on file.   Social History Main Topics  . Smoking status: Never Smoker   . Smokeless tobacco: Not on file  . Alcohol Use: 0.0 oz/week    0 Standard drinks or equivalent per week     Comment: occasional  . Drug Use: No  . Sexual Activity: Yes   Other Topics Concern  . Not on file   Social History Narrative     Constitutional: Denies fever, malaise, fatigue, headache or abrupt weight changes.  HEENT: Denies eye pain, eye redness, ear pain, ringing in the ears, wax buildup, nasal congestion, runny nose or sore throat. Respiratory: Denies difficulty breathing, shortness of breath, cough or sputum production.   Cardiovascular: Denies chest pain, chest tightness, palpitations or swelling in the hands or feet.  Gastrointestinal: Denies abdominal pain, bloating, or blood in the stool.  GU: Pt reports urine leakage with coughing/sneezing. Denies urgency, frequency, pain with urination, burning sensation, blood in urine, odor or discharge. Musculoskeletal: Denies decrease in range of motion, difficulty with gait, muscle pain or joint swelling.  Skin: Denies redness, rashes, lesions or ulcercations.  Neurological: Denies dizziness, difficulty with memory, difficulty with speech or problems with balance and coordination.  Psych: Pt reports chronic anxiety or depression. Denies SI/HI.  No other specific complaints in a complete review of systems (except as listed in HPI above).   Objective:   Physical Exam BP 120/78 mmHg  Pulse 62  Temp(Src) 98.6 F (37 C) (Oral)  Wt  170 lb (77.111 kg)  SpO2 98%  LMP  (LMP Unknown)   General: Appears her stated age, well developed, well nourished in NAD. HEENT: Head: normal shape and size; Eyes: sclera white, no icterus, conjunctiva pink, PERRLA and EOMs intact; Ears: Tm's gray and intact, normal light reflex;  Throat/Mouth: Teeth present, mucosa erythematous and moist, no exudate, lesions or ulcerations noted.  Cardiovascular: Normal rate and rhythm. S1,S2 noted.  No murmur, rubs or gallops noted. No JVD or BLE edema. No carotid bruits noted. Pulmonary/Chest: Normal effort and positive vesicular breath sounds. No respiratory distress. No wheezes, rales or ronchi noted.  Abdomen: Soft and nontender. Normal bowel sounds Neurological: Alert and oriented.  Psychiatric: Mood and affect somewhat flat today. Behavior is normal. Judgment and thought content normal.       Assessment & Plan:   Elevated platelets:  Recheck CBC today  RTC in 6 months for your annual exam

## 2016-04-03 NOTE — Assessment & Plan Note (Signed)
>>  ASSESSMENT AND PLAN FOR FREQUENT HEADACHES WRITTEN ON 04/03/2016  8:53 AM BY Lorre Munroe, NP  Possibly allergy related Continue Allegra, Nasonex, Singulair and Ibuprofen as needed

## 2016-04-03 NOTE — Assessment & Plan Note (Signed)
Continue Ditropan Will monitor

## 2016-04-03 NOTE — Addendum Note (Signed)
Addended by: Marchia Bond on: 04/03/2016 10:42 AM   Modules accepted: Orders

## 2016-04-03 NOTE — Patient Instructions (Signed)

## 2016-04-03 NOTE — Assessment & Plan Note (Signed)
Continue Allegra, Nasonex and Singulair as needed

## 2016-04-03 NOTE — Assessment & Plan Note (Signed)
Possibly allergy related Continue Allegra, Nasonex, Singulair and Ibuprofen as needed

## 2016-04-03 NOTE — Assessment & Plan Note (Signed)
Controlled on Zoloft Wean not indicated at this time.

## 2016-04-03 NOTE — Assessment & Plan Note (Signed)
Will check Lipid Profile and CMET today Encouraged her to consume a low fat diet Continue Zocor unless directed otherwise

## 2016-04-04 LAB — CBC WITH DIFFERENTIAL/PLATELET
BASOS PCT: 0 %
Basophils Absolute: 0 cells/uL (ref 0–200)
EOS PCT: 6 %
Eosinophils Absolute: 360 cells/uL (ref 15–500)
HCT: 38.6 % (ref 35.0–45.0)
Hemoglobin: 12.6 g/dL (ref 11.7–15.5)
Lymphocytes Relative: 30 %
Lymphs Abs: 1800 cells/uL (ref 850–3900)
MCH: 29.6 pg (ref 27.0–33.0)
MCHC: 32.6 g/dL (ref 32.0–36.0)
MCV: 90.8 fL (ref 80.0–100.0)
MONOS PCT: 8 %
MPV: 9.3 fL (ref 7.5–12.5)
Monocytes Absolute: 480 cells/uL (ref 200–950)
NEUTROS ABS: 3360 {cells}/uL (ref 1500–7800)
Neutrophils Relative %: 56 %
PLATELETS: 310 10*3/uL (ref 140–400)
RBC: 4.25 MIL/uL (ref 3.80–5.10)
RDW: 12.9 % (ref 11.0–15.0)
WBC: 6 10*3/uL (ref 3.8–10.8)

## 2016-04-04 LAB — COMPREHENSIVE METABOLIC PANEL
ALK PHOS: 50 U/L (ref 33–130)
ALT: 18 U/L (ref 6–29)
AST: 24 U/L (ref 10–35)
Albumin: 4.2 g/dL (ref 3.6–5.1)
BUN: 10 mg/dL (ref 7–25)
CHLORIDE: 104 mmol/L (ref 98–110)
CO2: 25 mmol/L (ref 20–31)
Calcium: 9.2 mg/dL (ref 8.6–10.4)
Creat: 0.87 mg/dL (ref 0.50–1.05)
GLUCOSE: 92 mg/dL (ref 65–99)
Potassium: 4.3 mmol/L (ref 3.5–5.3)
SODIUM: 140 mmol/L (ref 135–146)
TOTAL PROTEIN: 6.6 g/dL (ref 6.1–8.1)
Total Bilirubin: 0.6 mg/dL (ref 0.2–1.2)

## 2016-04-04 LAB — LIPID PANEL
CHOL/HDL RATIO: 3.2 ratio (ref <=5.0)
CHOLESTEROL: 172 mg/dL (ref 125–200)
HDL: 54 mg/dL (ref >=46)
LDL Cholesterol: 91 mg/dL (ref <130)
Triglycerides: 133 mg/dL (ref <150)
VLDL: 27 mg/dL (ref <30)

## 2016-04-05 ENCOUNTER — Other Ambulatory Visit: Payer: Self-pay | Admitting: Internal Medicine

## 2016-04-09 ENCOUNTER — Other Ambulatory Visit: Payer: Self-pay | Admitting: Internal Medicine

## 2016-10-04 ENCOUNTER — Encounter: Payer: Managed Care, Other (non HMO) | Admitting: Internal Medicine

## 2016-10-10 ENCOUNTER — Encounter: Payer: Self-pay | Admitting: Internal Medicine

## 2016-10-10 ENCOUNTER — Ambulatory Visit (INDEPENDENT_AMBULATORY_CARE_PROVIDER_SITE_OTHER): Payer: BLUE CROSS/BLUE SHIELD | Admitting: Internal Medicine

## 2016-10-10 VITALS — BP 120/80 | HR 64 | Temp 97.8°F | Ht 63.75 in | Wt 152.0 lb

## 2016-10-10 DIAGNOSIS — Z0000 Encounter for general adult medical examination without abnormal findings: Secondary | ICD-10-CM

## 2016-10-10 DIAGNOSIS — Z0001 Encounter for general adult medical examination with abnormal findings: Secondary | ICD-10-CM

## 2016-10-10 LAB — LIPID PANEL
CHOL/HDL RATIO: 4
Cholesterol: 173 mg/dL (ref 0–200)
HDL: 45.1 mg/dL (ref >39.00)
LDL Cholesterol: 104 mg/dL — ABNORMAL HIGH (ref 0–99)
NonHDL: 127.44
TRIGLYCERIDES: 118 mg/dL (ref 0.0–149.0)
VLDL: 23.6 mg/dL (ref 0.0–40.0)

## 2016-10-10 LAB — COMPREHENSIVE METABOLIC PANEL
ALK PHOS: 45 U/L (ref 39–117)
ALT: 12 U/L (ref 0–35)
AST: 20 U/L (ref 0–37)
Albumin: 4.4 g/dL (ref 3.5–5.2)
BILIRUBIN TOTAL: 0.4 mg/dL (ref 0.2–1.2)
BUN: 8 mg/dL (ref 6–23)
CALCIUM: 9 mg/dL (ref 8.4–10.5)
CO2: 28 meq/L (ref 19–32)
Chloride: 107 mEq/L (ref 96–112)
Creatinine, Ser: 0.78 mg/dL (ref 0.40–1.20)
GFR: 81.9 mL/min (ref >60.00)
Glucose, Bld: 97 mg/dL (ref 70–99)
POTASSIUM: 3.7 meq/L (ref 3.5–5.1)
Sodium: 141 mEq/L (ref 135–145)
Total Protein: 7 g/dL (ref 6.0–8.3)

## 2016-10-10 LAB — CBC
HEMATOCRIT: 37.5 % (ref 36.0–46.0)
HEMOGLOBIN: 12.8 g/dL (ref 12.0–15.0)
MCHC: 34.1 g/dL (ref 30.0–36.0)
MCV: 89.7 fl (ref 78.0–100.0)
PLATELETS: 262 10*3/uL (ref 150.0–400.0)
RBC: 4.18 Mil/uL (ref 3.87–5.11)
RDW: 12.6 % (ref 11.5–15.5)
WBC: 7.3 10*3/uL (ref 4.0–10.5)

## 2016-10-10 MED ORDER — MOMETASONE FUROATE 50 MCG/ACT NA SUSP: 2.0000 | Freq: Every day | NASAL | 2 refills | Status: DC

## 2016-10-10 NOTE — Progress Notes (Signed)
Subjective:    Patient ID: Pamela Francis, female    DOB: 02-17-1961, 56 y.o.   MRN: [id number]  HPI  Pt presents to the clinic today for her annual exam.  Flu: never Tetanus: 01/2008 Pap Smear: 03/2014 Mammogram: 02/2016 Colon Screening: 03/2012 Vision Screening: 09/2016 at [place] Dentist: bianually  Diet: She does eat meat. She consumes fruits and veggies daily. She does eat some fried food. She drinks mostly iced tea or water. Exercise: None  Review of Systems      Past Medical History:  Diagnosis Date  . Allergy   . Blood in stool   . Chicken pox   . Frequent headaches   . Urine incontinence     Current Outpatient Prescriptions  Medication Sig Dispense Refill  . fexofenadine (ALLEGRA) 180 MG tablet Take 180 mg by mouth daily.    . hydrocortisone (ANUSOL-HC) 25 MG suppository Place 1 suppository (25 mg total) rectally 2 (two) times daily. 12 suppository 0  . mometasone (NASONEX) 50 MCG/ACT nasal spray Place 2 sprays into the nose daily.    . montelukast (SINGULAIR) 10 MG tablet TAKE 1 TABLET BY MOUTH EVERY DAY IN THE EVENING 90 tablet 3  . oxybutynin (DITROPAN-XL) 10 MG 24 hr tablet TAKE 1 TABLET BY MOUTH EVERY DAY 90 tablet 1  . sertraline (ZOLOFT) 50 MG tablet TAKE 1 TABLET ONCE A DAY 90 tablet 3  . simvastatin (ZOCOR) 20 MG tablet TAKE 1 TABLET BY MOUTH IN THE EVENING 30 tablet 11   No current facility-administered medications for this visit.     No Known Allergies  Family History  Problem Relation Age of Onset  . Arthritis Mother   . Hyperlipidemia Mother   . Hypertension Mother   . Cancer Father     Lung  . Cancer Maternal Uncle     Prostate  . Rheum arthritis Maternal Grandmother   . Stroke Maternal Grandmother   . Alcohol abuse Maternal Grandfather   . Breast cancer Neg Hx     Social History   Social History  . Marital status: Single    Spouse name: N/A  . Number of children: N/A  . Years of education: N/A   Occupational History    . Not on file.   Social History Main Topics  . Smoking status: Never Smoker  . Smokeless tobacco: Not on file  . Alcohol use 0.0 oz/week     Comment: occasional  . Drug use: No  . Sexual activity: Yes   Other Topics Concern  . Not on file   Social History Narrative  . No narrative on file     Constitutional: Denies fever, malaise, fatigue, headache or abrupt weight changes.  HEENT: Pt reports nasal congestion, sore throat. Denies eye pain, eye redness, ear pain, ringing in the ears, wax buildup, runny nose, bloody nose. Respiratory: Denies difficulty breathing, shortness of breath, cough or sputum production.   Cardiovascular: Denies chest pain, chest tightness, palpitations or swelling in the hands or feet.  Gastrointestinal: Pt reports intermittent reflux and loose stool. Denies abdominal pain, bloating, constipation, diarrhea or blood in the stool.  GU: Denies urgency, frequency, pain with urination, burning sensation, blood in urine, odor or discharge. Musculoskeletal: Denies decrease in range of motion, difficulty with gait, muscle pain or joint pain and swelling.  Skin: Denies redness, rashes, lesions or ulcercations.  Neurological: Denies dizziness, difficulty with memory, difficulty with speech or problems with balance and coordination.  Psych: Denies anxiety, depression, SI/HI.  No other specific complaints in a complete review of systems (except as listed in HPI above).  Objective:   Physical Exam  BP 120/80   Pulse 64   Temp 97.8 F (36.6 C) (Oral)   Ht 5' 3.75" (1.619 m)   Wt 152 lb (68.9 kg)   LMP  (LMP Unknown)   SpO2 99%   BMI 26.30 kg/m  Wt Readings from Last 3 Encounters:  10/10/16 152 lb (68.9 kg)  04/03/16 170 lb (77.1 kg)  09/22/15 165 lb (74.8 kg)    General: Appears her stated age, well developed, well nourished in NAD. Skin: Warm, dry and intact. No rashes, lesions or ulcerations noted. HEENT: Head: normal shape and size, no sinus  tenderness noted; Eyes: sclera white, no icterus, conjunctiva pink, PERRLA and EOMs intact; Ears: Tm's gray and intact, normal light reflex; Nose: mucosa pink and moist, septum midline; Throat/Mouth: Teeth present, mucosa pink and moist, no exudate, lesions or ulcerations noted.  Neck:  Neck supple, trachea midline. No masses, lumps or thyromegaly present.  Cardiovascular: Normal rate and rhythm. S1,S2 noted.  No murmur, rubs or gallops noted. No JVD or BLE edema. No carotid bruits noted. Pulmonary/Chest: Normal effort and positive vesicular breath sounds. No respiratory distress. No wheezes, rales or ronchi noted.  Abdomen: Soft and nontender. Normal bowel sounds. No distention or masses noted. Liver, spleen and kidneys non palpable. Musculoskeletal: Normal range of motion.  Strength 5/5 BUE/BLE. No difficulty with gait.  Neurological: Alert and oriented. Cranial nerves II-XII grossly intact. Coordination normal.  Psychiatric: Mood and affect normal. Behavior is normal. Judgment and thought content normal.     BMET    Component Value Date/Time   NA 141 10/10/2016 1550   K 3.7 10/10/2016 1550   CL 107 10/10/2016 1550   CO2 28 10/10/2016 1550   GLUCOSE 97 10/10/2016 1550   BUN 8 10/10/2016 1550   CREATININE 0.78 10/10/2016 1550   CREATININE 0.87 04/03/2016 1042   CALCIUM 9.0 10/10/2016 1550    Lipid Panel     Component Value Date/Time   CHOL 173 10/10/2016 1550   TRIG 118.0 10/10/2016 1550   HDL 45.10 10/10/2016 1550   CHOLHDL 4 10/10/2016 1550   VLDL 23.6 10/10/2016 1550   LDLCALC 104 (H) 10/10/2016 1550    CBC    Component Value Date/Time   WBC 7.3 10/10/2016 1550   RBC 4.18 10/10/2016 1550   HGB 12.8 10/10/2016 1550   HCT 37.5 10/10/2016 1550   PLT 262.0 10/10/2016 1550   MCV 89.7 10/10/2016 1550   MCH 29.6 04/03/2016 1042   MCHC 34.1 10/10/2016 1550   RDW 12.6 10/10/2016 1550   LYMPHSABS 1,800 04/03/2016 1042   MONOABS 480 04/03/2016 1042   EOSABS 360 04/03/2016  1042   BASOSABS 0 04/03/2016 1042    Hgb A1C No results found for: HGBA1C          Assessment & Plan:   Preventative Health Maintenance:  She declines flu shot Tetanus UTD Pap smear and mammogram UTD Colon screening UTD Encouraged her to consume a balanced diet and exercise regimen Advised her to see an eye doctor and dentist annually  RTC in 1 year, sooner if needed Webb Silversmith, NP

## 2016-10-10 NOTE — Patient Instructions (Signed)

## 2016-10-11 ENCOUNTER — Other Ambulatory Visit: Payer: Self-pay | Admitting: Internal Medicine

## 2016-10-11 ENCOUNTER — Telehealth: Payer: Self-pay

## 2016-10-11 MED ORDER — FLUTICASONE PROPIONATE 50 MCG/ACT NA SUSP
2.0000 | Freq: Every day | NASAL | 6 refills | Status: AC
Start: 2016-10-11 — End: ?

## 2016-10-11 NOTE — Telephone Encounter (Signed)
Flonase sent to [facility]

## 2016-10-11 NOTE — Telephone Encounter (Signed)
Pt left v/m; pt was seen 10/10/16 and mometasone nasal spray will cost pt $61.00; if generic flonase was sent to pharmacy cost to pt would be $3.00 pt request flonase to [facility].Please advise.

## 2016-11-21 ENCOUNTER — Other Ambulatory Visit: Payer: Self-pay | Admitting: Internal Medicine

## 2016-12-06 ENCOUNTER — Other Ambulatory Visit: Payer: Self-pay | Admitting: Internal Medicine

## 2016-12-06 DIAGNOSIS — K648 Other hemorrhoids: Secondary | ICD-10-CM

## 2016-12-06 NOTE — Telephone Encounter (Signed)
Please advise if okay to refill suppositories for internal hemorrhoids

## 2017-02-27 ENCOUNTER — Other Ambulatory Visit: Payer: Self-pay | Admitting: Internal Medicine

## 2017-02-27 DIAGNOSIS — Z1231 Encounter for screening mammogram for malignant neoplasm of breast: Secondary | ICD-10-CM

## 2017-03-18 ENCOUNTER — Ambulatory Visit
Admission: RE | Admit: 2017-03-18 | Discharge: 2017-03-18 | Disposition: A | Discharge status: Home / Self Care | Payer: BLUE CROSS/BLUE SHIELD | Source: Ambulatory Visit | Attending: Internal Medicine | Admitting: Internal Medicine

## 2017-03-18 DIAGNOSIS — Z1231 Encounter for screening mammogram for malignant neoplasm of breast: Secondary | ICD-10-CM | POA: Diagnosis not present

## 2017-08-10 ENCOUNTER — Other Ambulatory Visit: Payer: Self-pay | Admitting: Internal Medicine

## 2017-09-02 ENCOUNTER — Ambulatory Visit (INDEPENDENT_AMBULATORY_CARE_PROVIDER_SITE_OTHER): Payer: BLUE CROSS/BLUE SHIELD | Admitting: Internal Medicine

## 2017-09-02 ENCOUNTER — Other Ambulatory Visit (HOSPITAL_COMMUNITY)
Admission: RE | Admit: 2017-09-02 | Discharge: 2017-09-02 | Disposition: A | Discharge status: Home / Self Care | Payer: BLUE CROSS/BLUE SHIELD | Source: Ambulatory Visit | Attending: Internal Medicine | Admitting: Internal Medicine

## 2017-09-02 ENCOUNTER — Encounter: Payer: Self-pay | Admitting: Internal Medicine

## 2017-09-02 VITALS — BP 122/80 | HR 72 | Temp 97.7°F | Ht 63.75 in | Wt 166.4 lb

## 2017-09-02 DIAGNOSIS — R51 Headache: Secondary | ICD-10-CM | POA: Diagnosis not present

## 2017-09-02 DIAGNOSIS — Z124 Encounter for screening for malignant neoplasm of cervix: Secondary | ICD-10-CM | POA: Diagnosis not present

## 2017-09-02 DIAGNOSIS — F329 Major depressive disorder, single episode, unspecified: Secondary | ICD-10-CM

## 2017-09-02 DIAGNOSIS — Z224 Carrier of infections with a predominantly sexual mode of transmission: Secondary | ICD-10-CM | POA: Diagnosis not present

## 2017-09-02 DIAGNOSIS — Z0000 Encounter for general adult medical examination without abnormal findings: Secondary | ICD-10-CM

## 2017-09-02 DIAGNOSIS — A630 Anogenital (venereal) warts: Secondary | ICD-10-CM | POA: Diagnosis not present

## 2017-09-02 DIAGNOSIS — F419 Anxiety disorder, unspecified: Secondary | ICD-10-CM | POA: Diagnosis not present

## 2017-09-02 DIAGNOSIS — E559 Vitamin D deficiency, unspecified: Secondary | ICD-10-CM | POA: Diagnosis not present

## 2017-09-02 DIAGNOSIS — E7800 Pure hypercholesterolemia, unspecified: Secondary | ICD-10-CM

## 2017-09-02 DIAGNOSIS — Z1211 Encounter for screening for malignant neoplasm of colon: Secondary | ICD-10-CM

## 2017-09-02 DIAGNOSIS — R519 Headache, unspecified: Secondary | ICD-10-CM

## 2017-09-02 LAB — LIPID PANEL
CHOL/HDL RATIO: 3
CHOLESTEROL: 148 mg/dL (ref 0–200)
HDL: 58.4 mg/dL (ref >39.00)
LDL CALC: 69 mg/dL (ref 0–99)
NonHDL: 89.39
TRIGLYCERIDES: 101 mg/dL (ref 0.0–149.0)
VLDL: 20.2 mg/dL (ref 0.0–40.0)

## 2017-09-02 LAB — COMPREHENSIVE METABOLIC PANEL
ALBUMIN: 4.3 g/dL (ref 3.5–5.2)
ALT: 17 U/L (ref 0–35)
AST: 23 U/L (ref 0–37)
Alkaline Phosphatase: 51 U/L (ref 39–117)
BILIRUBIN TOTAL: 0.6 mg/dL (ref 0.2–1.2)
BUN: 10 mg/dL (ref 6–23)
CALCIUM: 8.9 mg/dL (ref 8.4–10.5)
CO2: 28 mEq/L (ref 19–32)
CREATININE: 0.8 mg/dL (ref 0.40–1.20)
Chloride: 104 mEq/L (ref 96–112)
GFR: 79.27 mL/min (ref >60.00)
Glucose, Bld: 94 mg/dL (ref 70–99)
Potassium: 3.8 mEq/L (ref 3.5–5.1)
SODIUM: 139 meq/L (ref 135–145)
TOTAL PROTEIN: 6.9 g/dL (ref 6.0–8.3)

## 2017-09-02 LAB — CBC
HCT: 39.5 % (ref 36.0–46.0)
Hemoglobin: 13.1 g/dL (ref 12.0–15.0)
MCHC: 33.2 g/dL (ref 30.0–36.0)
MCV: 91.8 fl (ref 78.0–100.0)
PLATELETS: 267 10*3/uL (ref 150.0–400.0)
RBC: 4.3 Mil/uL (ref 3.87–5.11)
RDW: 12.8 % (ref 11.5–15.5)
WBC: 5.7 10*3/uL (ref 4.0–10.5)

## 2017-09-02 LAB — VITAMIN D 25 HYDROXY (VIT D DEFICIENCY, FRACTURES): VITD: 24.98 ng/mL — AB (ref 30.00–100.00)

## 2017-09-02 NOTE — Assessment & Plan Note (Signed)
>>  ASSESSMENT AND PLAN FOR FREQUENT HEADACHES WRITTEN ON 09/02/2017 11:53 AM BY Lorre Munroe, NP  Seems allergy related Start taking Allegra and Flonase daily If no improvement, consider low dose Amitriptyline or Topamax

## 2017-09-02 NOTE — Assessment & Plan Note (Signed)
CMET and lipid profile today Encouraged her to consume a low fat diet Continue Simvastatin for now, will adjust if needed based on labs 

## 2017-09-02 NOTE — Progress Notes (Signed)
Subjective:    Patient ID: Pamela Francis, female    DOB: 1961/04/14, 56 y.o.   MRN: [id number]  HPI  Pt presents to the clinic today for her annual exam. She is also due to follow up chronic conditions.  Frequent Headaches: The pain is located in her forehead, just above her sinuses. These are occurring a few times per week. She is not sure what triggers them. She will take Tylenol, Advil and Excedrin as needed with some relief. She takes Environmental manager on an as needed basis.  HLD: Her last LDL was 104, 09/2016. She is taking Simvastatin as prescribed. She denies myalgias. She tries to consume a low fat diet.  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 She denies SI/HI.  OAB: She has urge incontinence. She is taking Ditropan as prescribed.  Flu: never Tetanus: 01/2008 Pap Smear: more than 5 years ago Mammogram: 03/2017 Colon Screening: 03/2012, 5 years Vision Screening: annually Dentist: biannually  Diet: She does eat meat. She consumes fruits and veggies daily. She tries to avoid fried foods. She drinks mostly water, unsweet tea. Exercise: None  Review of Systems  Past Medical History:  Diagnosis Date  . Allergy   . Blood in stool   . Chicken pox   . Frequent headaches   . Urine incontinence     Current Outpatient Medications  Medication Sig Dispense Refill  . fexofenadine (ALLEGRA) 180 MG tablet Take 180 mg by mouth daily.    . fluticasone (FLONASE) 50 MCG/ACT nasal spray Place 2 sprays into both nostrils daily. 16 g 6  . hydrocortisone (ANUSOL-HC) 25 MG suppository PLACE 1 SUPPOSITORY (25 MG TOTAL) RECTALLY 2 (TWO) TIMES DAILY. 12 suppository 0  . montelukast (SINGULAIR) 10 MG tablet TAKE 1 TABLET BY MOUTH EVERY DAY IN THE EVENING 90 tablet 2  . oxybutynin (DITROPAN-XL) 10 MG 24 hr tablet TAKE 1 TABLET BY MOUTH EVERY DAY 90 tablet 2  . sertraline (ZOLOFT) 50 MG tablet TAKE 1 TABLET ONCE A DAY 90 tablet 2  . simvastatin (ZOCOR) 20 MG  tablet TAKE 1 TABLET BY MOUTH IN THE EVENING 90 tablet 0   No current facility-administered medications for this visit.     No Known Allergies  Family History  Problem Relation Age of Onset  . Arthritis Mother   . Hyperlipidemia Mother   . Hypertension Mother   . Cancer Father        Lung  . Cancer Maternal Uncle        Prostate  . Rheum arthritis Maternal Grandmother   . Stroke Maternal Grandmother   . Alcohol abuse Maternal Grandfather   . Breast cancer Neg Hx     Social History   Socioeconomic History  . Marital status: Single    Spouse name: Not on file  . Number of children: Not on file  . Years of education: Not on file  . Highest education level: Not on file  Social Needs  . Financial resource strain: Not on file  . Food insecurity - worry: Not on file  . Food insecurity - inability: Not on file  . Transportation needs - medical: Not on file  . Transportation needs - non-medical: Not on file  Occupational History  . Not on file  Tobacco Use  . Smoking status: Never Smoker  . Smokeless tobacco: Never Used  Substance and Sexual Activity  . Alcohol use: Yes    Alcohol/week: 0.0 oz    Comment: occasional  .  Drug use: No  . Sexual activity: Yes  Other Topics Concern  . Not on file  Social History Narrative  . Not on file     Constitutional: Denies fever, malaise, fatigue, headache or abrupt weight changes.  HEENT: Denies eye pain, eye redness, ear pain, ringing in the ears, wax buildup, runny nose, nasal congestion, bloody nose, or sore throat. Respiratory: Denies difficulty breathing, shortness of breath, cough or sputum production.   Cardiovascular: Denies chest pain, chest tightness, palpitations or swelling in the hands or feet.  Gastrointestinal: Denies abdominal pain, bloating, constipation, diarrhea or blood in the stool.  GU: Pt reports urge incontinence. Denies urgency, frequency, pain with urination, burning sensation, blood in urine, odor or  discharge. Musculoskeletal: Denies decrease in range of motion, difficulty with gait, muscle pain or joint pain and swelling.  Skin: Denies redness, rashes, lesions or ulcercations.  Neurological: Denies dizziness, difficulty with memory, difficulty with speech or problems with balance and coordination.  Psych: Pt has a history of anxiety and depression. Denies SI/HI.  No other specific complaints in a complete review of systems (except as listed in HPI above).     Objective:   Physical Exam   BP 122/80   Pulse 72   Temp 97.7 F (36.5 C) (Oral)   Ht 5' 3.75" (1.619 m)   Wt 166 lb 6.4 oz (75.5 kg)   LMP  (LMP Unknown)   SpO2 97%   BMI 28.79 kg/m  Wt Readings from Last 3 Encounters:  09/02/17 166 lb 6.4 oz (75.5 kg)  10/10/16 152 lb (68.9 kg)  04/03/16 170 lb (77.1 kg)    General: Appears her stated age, in NAD. Skin: Warm, dry and intact.  HEENT: Head: normal shape and size; Eyes: sclera white, no icterus, conjunctiva pink, PERRLA and EOMs intact; Ears: Tm's gray and intact, normal light reflex; Nose: mucosa pink and moist, septum midline; Throat/Mouth: Teeth present, mucosa pink and moist, no exudate, lesions or ulcerations noted.  Neck:  Neck supple, trachea midline. No masses, lumps or thyromegaly present.  Cardiovascular: Normal rate and rhythm. S1,S2 noted.  No murmur, rubs or gallops noted. No JVD or BLE edema. No carotid bruits noted. Pulmonary/Chest: Normal effort and positive vesicular breath sounds. No respiratory distress. No wheezes, rales or ronchi noted.  Abdomen: Soft and nontender. Normal bowel sounds. No distention or masses noted. Liver, spleen and kidneys non palpable. Pelvic: Normal female anatomy. Cervix without noted changes. No CMT noted. Adnexa non palpable.  Musculoskeletal: Strength 5/5 BUE/BLE. No difficulty with gait.  Neurological: Alert and oriented. Cranial nerves II-XII grossly intact. Coordination normal.  Psychiatric: Mood and affect flat.  Behavior is normal. Judgment and thought content normal.    BMET    Component Value Date/Time   NA 141 10/10/2016 1550   K 3.7 10/10/2016 1550   CL 107 10/10/2016 1550   CO2 28 10/10/2016 1550   GLUCOSE 97 10/10/2016 1550   BUN 8 10/10/2016 1550   CREATININE 0.78 10/10/2016 1550   CREATININE 0.87 04/03/2016 1042   CALCIUM 9.0 10/10/2016 1550    Lipid Panel     Component Value Date/Time   CHOL 173 10/10/2016 1550   TRIG 118.0 10/10/2016 1550   HDL 45.10 10/10/2016 1550   CHOLHDL 4 10/10/2016 1550   VLDL 23.6 10/10/2016 1550   LDLCALC 104 (H) 10/10/2016 1550    CBC    Component Value Date/Time   WBC 7.3 10/10/2016 1550   RBC 4.18 10/10/2016 1550   HGB  12.8 10/10/2016 1550   HCT 37.5 10/10/2016 1550   PLT 262.0 10/10/2016 1550   MCV 89.7 10/10/2016 1550   MCH 29.6 04/03/2016 1042   MCHC 34.1 10/10/2016 1550   RDW 12.6 10/10/2016 1550   LYMPHSABS 1,800 04/03/2016 1042   MONOABS 480 04/03/2016 1042   EOSABS 360 04/03/2016 1042   BASOSABS 0 04/03/2016 1042    Hgb A1C No results found for: HGBA1C         Assessment & Plan:   Preventative Health Maintenance:  She declines flu shot today Tetanus due 2019 Pap smear today, she declines STD screening Mammogram UTD Referral placed to GI for screening colonoscopy Encouraged her to consume a balanced diet and exercise regimen Advised her to see an eye doctor and dentist annually Will check CBC, CMET, Lipid and Vit D today  RTC in 1 year, sooner if needed Webb Silversmith, NP

## 2017-09-02 NOTE — Assessment & Plan Note (Signed)
Chronic but stable on Sertraline She is not interested in weaning any medication at this time Will monitor

## 2017-09-02 NOTE — Addendum Note (Signed)
Addended by: Jacqualin Combes on: 09/02/2017 12:46 PM   Modules accepted: Orders

## 2017-09-02 NOTE — Patient Instructions (Signed)
Health Maintenance for Postmenopausal Women Menopause is a normal process in which your reproductive ability comes to an end. This process happens gradually over a span of months to years, usually between the ages of 22 and 9. Menopause is complete when you have missed 12 consecutive menstrual periods. It is important to talk with your health care provider about some of the most common conditions that affect postmenopausal women, such as heart disease, cancer, and bone loss (osteoporosis). Adopting a healthy lifestyle and getting preventive care can help to promote your health and wellness. Those actions can also lower your chances of developing some of these common conditions. What should I know about menopause? During menopause, you may experience a number of symptoms, such as:  Moderate-to-severe hot flashes.  Night sweats.  Decrease in sex drive.  Mood swings.  Headaches.  Tiredness.  Irritability.  Memory problems.  Insomnia.  Choosing to treat or not to treat menopausal changes is an individual decision that you make with your health care provider. What should I know about hormone replacement therapy and supplements? Hormone therapy products are effective for treating symptoms that are associated with menopause, such as hot flashes and night sweats. Hormone replacement carries certain risks, especially as you become older. If you are thinking about using estrogen or estrogen with progestin treatments, discuss the benefits and risks with your health care provider. What should I know about heart disease and stroke? Heart disease, heart attack, and stroke become more likely as you age. This may be due, in part, to the hormonal changes that your body experiences during menopause. These can affect how your body processes dietary fats, triglycerides, and cholesterol. Heart attack and stroke are both medical emergencies. There are many things that you can do to help prevent heart disease  and stroke:  Have your blood pressure checked at least every 1-2 years. High blood pressure causes heart disease and increases the risk of stroke.  If you are 54-22 years old, ask your health care provider if you should take aspirin to prevent a heart attack or a stroke.  Do not use any tobacco products, including cigarettes, chewing tobacco, or electronic cigarettes. If you need help quitting, ask your health care provider.  It is important to eat a healthy diet and maintain a healthy weight. ? Be sure to include plenty of vegetables, fruits, low-fat dairy products, and lean protein. ? Avoid eating foods that are high in solid fats, added sugars, or salt (sodium).  Get regular exercise. This is one of the most important things that you can do for your health. ? Try to exercise for at least 150 minutes each week. The type of exercise that you do should increase your heart rate and make you sweat. This is known as moderate-intensity exercise. ? Try to do strengthening exercises at least twice each week. Do these in addition to the moderate-intensity exercise.  Know your numbers.Ask your health care provider to check your cholesterol and your blood glucose. Continue to have your blood tested as directed by your health care provider.  What should I know about cancer screening? There are several types of cancer. Take the following steps to reduce your risk and to catch any cancer development as early as possible. Breast Cancer  Practice breast self-awareness. ? This means understanding how your breasts normally appear and feel. ? It also means doing regular breast self-exams. Let your health care provider know about any changes, no matter how small.  If you are 54  or older, have a clinician do a breast exam (clinical breast exam or CBE) every year. Depending on your age, family history, and medical history, it may be recommended that you also have a yearly breast X-ray (mammogram).  If you  have a family history of breast cancer, talk with your health care provider about genetic screening.  If you are at high risk for breast cancer, talk with your health care provider about having an MRI and a mammogram every year.  Breast cancer (BRCA) gene test is recommended for women who have family members with BRCA-related cancers. Results of the assessment will determine the need for genetic counseling and BRCA1 and for BRCA2 testing. BRCA-related cancers include these types: ? Breast. This occurs in males or females. ? Ovarian. ? Tubal. This may also be called fallopian tube cancer. ? Cancer of the abdominal or pelvic lining (peritoneal cancer). ? Prostate. ? Pancreatic.  Cervical, Uterine, and Ovarian Cancer Your health care provider may recommend that you be screened regularly for cancer of the pelvic organs. These include your ovaries, uterus, and vagina. This screening involves a pelvic exam, which includes checking for microscopic changes to the surface of your cervix (Pap test).  For women ages 54-65, health care providers may recommend a pelvic exam and a Pap test every three years. For women ages 54-65, they may recommend the Pap test and pelvic exam, combined with testing for human papilloma virus (HPV), every five years. Some types of HPV increase your risk of cervical cancer. Testing for HPV may also be done on women of any age who have unclear Pap test results.  Other health care providers may not recommend any screening for nonpregnant women who are considered low risk for pelvic cancer and have no symptoms. Ask your health care provider if a screening pelvic exam is right for you.  If you have had past treatment for cervical cancer or a condition that could lead to cancer, you need Pap tests and screening for cancer for at least 20 years after your treatment. If Pap tests have been discontinued for you, your risk factors (such as having a new sexual partner) need to be  reassessed to determine if you should start having screenings again. Some women have medical problems that increase the chance of getting cervical cancer. In these cases, your health care provider may recommend that you have screening and Pap tests more often.  If you have a family history of uterine cancer or ovarian cancer, talk with your health care provider about genetic screening.  If you have vaginal bleeding after reaching menopause, tell your health care provider.  There are currently no reliable tests available to screen for ovarian cancer.  Lung Cancer Lung cancer screening is recommended for adults 69-62 years old who are at high risk for lung cancer because of a history of smoking. A yearly low-dose CT scan of the lungs is recommended if you:  Currently smoke.  Have a history of at least 30 pack-years of smoking and you currently smoke or have quit within the past 15 years. A pack-year is smoking an average of one pack of cigarettes per day for one year.  Yearly screening should:  Continue until it has been 15 years since you quit.  Stop if you develop a health problem that would prevent you from having lung cancer treatment.  Colorectal Cancer  This type of cancer can be detected and can often be prevented.  Routine colorectal cancer screening usually begins at  age 42 and continues through age 45.  If you have risk factors for colon cancer, your health care provider may recommend that you be screened at an earlier age.  If you have a family history of colorectal cancer, talk with your health care provider about genetic screening.  Your health care provider may also recommend using home test kits to check for hidden blood in your stool.  A small camera at the end of a tube can be used to examine your colon directly (sigmoidoscopy or colonoscopy). This is done to check for the earliest forms of colorectal cancer.  Direct examination of the colon should be repeated every  5-10 years until age 71. However, if early forms of precancerous polyps or small growths are found or if you have a family history or genetic risk for colorectal cancer, you may need to be screened more often.  Skin Cancer  Check your skin from head to toe regularly.  Monitor any moles. Be sure to tell your health care provider: ? About any new moles or changes in moles, especially if there is a change in a mole's shape or color. ? If you have a mole that is larger than the size of a pencil eraser.  If any of your family members has a history of skin cancer, especially at a young age, talk with your health care provider about genetic screening.  Always use sunscreen. Apply sunscreen liberally and repeatedly throughout the day.  Whenever you are outside, protect yourself by wearing long sleeves, pants, a wide-brimmed hat, and sunglasses.  What should I know about osteoporosis? Osteoporosis is a condition in which bone destruction happens more quickly than new bone creation. After menopause, you may be at an increased risk for osteoporosis. To help prevent osteoporosis or the bone fractures that can happen because of osteoporosis, the following is recommended:  If you are 46-71 years old, get at least 1,000 mg of calcium and at least 600 mg of vitamin D per day.  If you are older than age 55 but younger than age 65, get at least 1,200 mg of calcium and at least 600 mg of vitamin D per day.  If you are older than age 54, get at least 1,200 mg of calcium and at least 800 mg of vitamin D per day.  Smoking and excessive alcohol intake increase the risk of osteoporosis. Eat foods that are rich in calcium and vitamin D, and do weight-bearing exercises several times each week as directed by your health care provider. What should I know about how menopause affects my mental health? Depression may occur at any age, but it is more common as you become older. Common symptoms of depression  include:  Low or sad mood.  Changes in sleep patterns.  Changes in appetite or eating patterns.  Feeling an overall lack of motivation or enjoyment of activities that you previously enjoyed.  Frequent crying spells.  Talk with your health care provider if you think that you are experiencing depression. What should I know about immunizations? It is important that you get and maintain your immunizations. These include:  Tetanus, diphtheria, and pertussis (Tdap) booster vaccine.  Influenza every year before the flu season begins.  Pneumonia vaccine.  Shingles vaccine.  Your health care provider may also recommend other immunizations. This information is not intended to replace advice given to you by your health care provider. Make sure you discuss any questions you have with your health care provider. Document Released: 10/26/2005  Document Revised: 03/23/2016 Document Reviewed: 06/07/2015 Elsevier Interactive Patient Education  2018 [employer]  

## 2017-09-02 NOTE — Assessment & Plan Note (Signed)
Seems allergy related Start taking Allegra and Flonase daily If no improvement, consider low dose Amitriptyline or Topamax

## 2017-09-03 LAB — CYTOLOGY - PAP
DIAGNOSIS: NEGATIVE
HPV 16/18/45 genotyping: POSITIVE — AB
HPV: DETECTED — AB

## 2017-10-09 ENCOUNTER — Telehealth: Payer: Self-pay | Admitting: Internal Medicine

## 2017-10-09 NOTE — Telephone Encounter (Signed)
Received referral for patient to have next colon at office. Patient requesting to see Dr.Pyrtle. Last colon 2013 in [place]. Records printed and placed on Dr.Pyrtle's desk for review.

## 2017-10-11 NOTE — Telephone Encounter (Signed)
Dr.Pyrtle reviewed report and marked okay to schedule next colon. Left message on vm for pt to cb and schedule. Referral in epic and signed report in referral folder.

## 2017-10-21 ENCOUNTER — Encounter: Payer: Self-pay | Admitting: Internal Medicine

## 2017-11-18 ENCOUNTER — Ambulatory Visit (AMBULATORY_SURGERY_CENTER): Payer: Self-pay | Admitting: *Deleted

## 2017-11-18 ENCOUNTER — Other Ambulatory Visit: Payer: Self-pay

## 2017-11-18 VITALS — Ht 63.75 in | Wt 170.4 lb

## 2017-11-18 DIAGNOSIS — Z1211 Encounter for screening for malignant neoplasm of colon: Secondary | ICD-10-CM

## 2017-11-18 MED ORDER — NA SULFATE-K SULFATE-MG SULF 17.5-3.13-1.6 GM/177ML PO SOLN: 1.0000 [IU] | Freq: Once | ORAL | 0 refills | Status: AC

## 2017-11-18 NOTE — Progress Notes (Signed)
No egg or soy allergy known to patient  Some N&V after breast surgery with sedation no intubation problems  No diet pills per patient No home 02 use per patient  No blood thinners per patient  Pt denies issues with constipation on and off increases water intake  No A fib or A flutter  EMMI video sent to pt's e mail

## 2017-11-27 ENCOUNTER — Encounter: Payer: Self-pay | Admitting: Internal Medicine

## 2017-11-27 ENCOUNTER — Other Ambulatory Visit: Payer: Self-pay | Admitting: Internal Medicine

## 2017-11-28 ENCOUNTER — Other Ambulatory Visit: Payer: Self-pay | Admitting: Internal Medicine

## 2017-12-04 ENCOUNTER — Encounter: Payer: Self-pay | Admitting: Internal Medicine

## 2017-12-04 ENCOUNTER — Other Ambulatory Visit: Payer: Self-pay

## 2017-12-04 ENCOUNTER — Ambulatory Visit (AMBULATORY_SURGERY_CENTER): Payer: BLUE CROSS/BLUE SHIELD | Admitting: Internal Medicine

## 2017-12-04 VITALS — BP 110/76 | HR 57 | Temp 98.6°F | Resp 20

## 2017-12-04 DIAGNOSIS — K626 Ulcer of anus and rectum: Secondary | ICD-10-CM | POA: Diagnosis not present

## 2017-12-04 DIAGNOSIS — K6289 Other specified diseases of anus and rectum: Secondary | ICD-10-CM

## 2017-12-04 DIAGNOSIS — K635 Polyp of colon: Secondary | ICD-10-CM

## 2017-12-04 DIAGNOSIS — Z1211 Encounter for screening for malignant neoplasm of colon: Secondary | ICD-10-CM | POA: Diagnosis present

## 2017-12-04 DIAGNOSIS — K621 Rectal polyp: Secondary | ICD-10-CM | POA: Diagnosis not present

## 2017-12-04 MED ORDER — SODIUM CHLORIDE 0.9 % IV SOLN: 500.0000 mL | Freq: Once | INTRAVENOUS | Status: DC

## 2017-12-04 NOTE — Patient Instructions (Signed)
YOU HAD AN ENDOSCOPIC PROCEDURE TODAY AT THE [facility]:   Refer to the procedure report that was given to you for any specific questions about what was found during the examination.  If the procedure report does not answer your questions, please call your gastroenterologist to clarify.  If you requested that your care partner not be given the details of your procedure findings, then the procedure report has been included in a sealed envelope for you to review at your convenience later.  YOU SHOULD EXPECT: Some feelings of bloating in the abdomen. Passage of more gas than usual.  Walking can help get rid of the air that was put into your GI tract during the procedure and reduce the bloating. If you had a lower endoscopy (such as a colonoscopy or flexible sigmoidoscopy) you may notice spotting of blood in your stool or on the toilet paper. If you underwent a bowel prep for your procedure, you may not have a normal bowel movement for a few days.  Please Note:  You might notice some irritation and congestion in your nose or some drainage.  This is from the oxygen used during your procedure.  There is no need for concern and it should clear up in a day or so.  SYMPTOMS TO REPORT IMMEDIATELY:   Following lower endoscopy (colonoscopy or flexible sigmoidoscopy):  Excessive amounts of blood in the stool  Significant tenderness or worsening of abdominal pains  Swelling of the abdomen that is new, acute  Fever of 100F or higher  Please see handouts given to you on Diverticulosis.  For urgent or emergent issues, a gastroenterologist can be reached at any hour by calling [phone].   DIET:  We do recommend a small meal at first, but then you may proceed to your regular diet.  Drink plenty of fluids but you should avoid alcoholic beverages for 24 hours.  ACTIVITY:  You should plan to take it easy for the rest of today and you should NOT DRIVE or use heavy machinery until tomorrow  (because of the sedation medicines used during the test).    FOLLOW UP: Our staff will call the number listed on your records the next business day following your procedure to check on you and address any questions or concerns that you may have regarding the information given to you following your procedure. If we do not reach you, we will leave a message.  However, if you are feeling well and you are not experiencing any problems, there is no need to return our call.  We will assume that you have returned to your regular daily activities without incident.  If any biopsies were taken you will be contacted by phone or by letter within the next 1-3 weeks.  Please call us at [phone] if you have not heard about the biopsies in 3 weeks.    SIGNATURES/CONFIDENTIALITY: You and/or your care partner have signed paperwork which will be entered into your electronic medical record.  These signatures attest to the fact that that the information above on your After Visit Summary has been reviewed and is understood.  Full responsibility of the confidentiality of this discharge information lies with you and/or your care-partner.  Thank you for letting us take care of your healthcare needs today. 

## 2017-12-04 NOTE — Op Note (Signed)
[facility] Patient Name: Pamela Francis Procedure Date: 12/04/2017 1:58 PM MRN: [id number] Endoscopist: Jerene Bears , MD Age: 57 Referring MD:  Date of Birth: November 29, 1960 Gender: Female Account #: [id number] Procedure:                Colonoscopy Indications:              Screening for colorectal malignant neoplasm Medicines:                Monitored Anesthesia Care Procedure:                Pre-Anesthesia Assessment:                           - Prior to the procedure, a History and Physical                            was performed, and patient medications and                            allergies were reviewed. The patient's tolerance of                            previous anesthesia was also reviewed. The risks                            and benefits of the procedure and the sedation                            options and risks were discussed with the patient.                            All questions were answered, and informed consent                            was obtained. Prior Anticoagulants: The patient has                            taken no previous anticoagulant or antiplatelet                            agents. ASA Grade Assessment: II - A patient with                            mild systemic disease. After reviewing the risks                            and benefits, the patient was deemed in                            satisfactory condition to undergo the procedure.                           After obtaining informed consent, the colonoscope  was passed under direct vision. Throughout the                            procedure, the patient's blood pressure, pulse, and                            oxygen saturations were monitored continuously. The                            Model PCF-[id number]) scope was introduced                            through the anus and advanced to the the cecum,                            identified by  appendiceal orifice and ileocecal                            valve. The colonoscopy was performed without                            difficulty. The patient tolerated the procedure                            well. The quality of the bowel preparation was                            good. The ileocecal valve, appendiceal orifice, and                            rectum were photographed. Scope In: 2:04:46 PM Scope Out: 2:23:10 PM Scope Withdrawal Time: 0 hours 14 minutes 8 seconds  Total Procedure Duration: 0 hours 18 minutes 24 seconds  Findings:                 The digital rectal exam was normal.                           A single (solitary) 5-6 mm ulcer was found in the                            mid rectum. This appears benign. No bleeding was                            present. No stigmata of recent bleeding were seen.                            Biopsies were taken with a cold forceps for                            histology.                           Inflamed, polypoid tissue was found in the distal  rectum approximating the dentate line. This likely                            represents inflamed hemorrhoid or prolapse tissue,                            though biopsies were taken with a cold forceps for                            histology to exclude adenomatous change.                           Multiple small-mouthed diverticula were found in                            the sigmoid colon.                           The exam was otherwise without abnormality. Complications:            No immediate complications. Estimated Blood Loss:     Estimated blood loss was minimal. Impression:               - A single (solitary) ulcer in the mid rectum.                            Biopsied.                           - Inflamed polypoid tissue was found in the distal                            rectum. Biopsied.                           - Diverticulosis in the sigmoid  colon.                           - The examination was otherwise normal. Recommendation:           - Patient has a contact number available for                            emergencies. The signs and symptoms of potential                            delayed complications were discussed with the                            patient. Return to normal activities tomorrow.                            Written discharge instructions were provided to the                            patient.                           -  Resume previous diet.                           - Continue present medications.                           - Await pathology results.                           - Repeat colonoscopy is recommended. The                            colonoscopy date will be determined after pathology                            results from today's exam become available for                            review. Jerene Bears, MD 12/04/2017 2:29:51 PM This report has been signed electronically.

## 2017-12-04 NOTE — Progress Notes (Signed)
Report given to PACU, vss 

## 2017-12-04 NOTE — Progress Notes (Signed)
Pt's states no medical or surgical changes since previsit or office visit.  Per Pt see did not know her father.  She said there was some cancer in the family but does not know specifics. maw

## 2017-12-04 NOTE — Progress Notes (Signed)
Called to room to assist during endoscopic procedure.  Patient ID and intended procedure confirmed with present staff. Received instructions for my participation in the procedure from the performing physician.  

## 2017-12-05 ENCOUNTER — Telehealth: Payer: Self-pay | Admitting: *Deleted

## 2017-12-05 NOTE — Telephone Encounter (Signed)
  Follow up Call-  Call back number 12/04/2017  Post procedure Call Back phone  # #[phone]  cell  Permission to leave phone message Yes  Some recent data might be hidden     Patient questions:  Do you have a fever, pain , or abdominal swelling? No. Pain Score  0 *  Have you tolerated food without any problems? Yes.    Have you been able to return to your normal activities? Yes.    Do you have any questions about your discharge instructions: Diet   Yes.   Medications  No. Follow up visit  No.  Do you have questions or concerns about your Care? No.  Actions: * If pain score is 4 or above: No action needed, pain <4.

## 2017-12-25 ENCOUNTER — Other Ambulatory Visit: Payer: Self-pay | Admitting: Internal Medicine

## 2018-03-21 ENCOUNTER — Other Ambulatory Visit: Payer: Self-pay | Admitting: Internal Medicine

## 2018-05-06 ENCOUNTER — Other Ambulatory Visit: Payer: Self-pay | Admitting: Internal Medicine

## 2018-05-06 DIAGNOSIS — Z1231 Encounter for screening mammogram for malignant neoplasm of breast: Secondary | ICD-10-CM

## 2018-05-23 ENCOUNTER — Ambulatory Visit
Admission: RE | Admit: 2018-05-23 | Discharge: 2018-05-23 | Disposition: A | Discharge status: Home / Self Care | Payer: BLUE CROSS/BLUE SHIELD | Source: Ambulatory Visit | Attending: Internal Medicine | Admitting: Internal Medicine

## 2018-05-23 DIAGNOSIS — Z1231 Encounter for screening mammogram for malignant neoplasm of breast: Secondary | ICD-10-CM | POA: Diagnosis present

## 2018-06-15 ENCOUNTER — Other Ambulatory Visit: Payer: Self-pay | Admitting: Internal Medicine

## 2018-06-29 ENCOUNTER — Other Ambulatory Visit: Payer: Self-pay | Admitting: Internal Medicine

## 2018-07-02 ENCOUNTER — Other Ambulatory Visit: Payer: Self-pay | Admitting: Internal Medicine

## 2018-09-11 ENCOUNTER — Encounter: Payer: Self-pay | Admitting: Internal Medicine

## 2018-09-15 ENCOUNTER — Other Ambulatory Visit (HOSPITAL_COMMUNITY)
Admission: RE | Admit: 2018-09-15 | Discharge: 2018-09-15 | Disposition: A | Discharge status: Home / Self Care | Payer: BLUE CROSS/BLUE SHIELD | Source: Ambulatory Visit | Attending: Internal Medicine | Admitting: Internal Medicine

## 2018-09-15 ENCOUNTER — Encounter: Payer: Self-pay | Admitting: Internal Medicine

## 2018-09-15 ENCOUNTER — Ambulatory Visit: Payer: BLUE CROSS/BLUE SHIELD | Admitting: Internal Medicine

## 2018-09-15 VITALS — BP 120/82 | HR 86 | Temp 98.0°F | Wt 174.0 lb

## 2018-09-15 DIAGNOSIS — B977 Papillomavirus as the cause of diseases classified elsewhere: Secondary | ICD-10-CM

## 2018-09-15 DIAGNOSIS — Z113 Encounter for screening for infections with a predominantly sexual mode of transmission: Secondary | ICD-10-CM | POA: Diagnosis not present

## 2018-09-15 DIAGNOSIS — Z124 Encounter for screening for malignant neoplasm of cervix: Secondary | ICD-10-CM | POA: Diagnosis not present

## 2018-09-15 DIAGNOSIS — N898 Other specified noninflammatory disorders of vagina: Secondary | ICD-10-CM | POA: Insufficient documentation

## 2018-09-15 NOTE — Addendum Note (Signed)
Addended by: Lurlean Nanny on: 09/15/2018 04:41 PM   Modules accepted: Orders

## 2018-09-15 NOTE — Patient Instructions (Signed)
Pap Test  Why am I having this test?  A Pap test, also called a Pap smear, is a screening test to check for signs of:  · Cancer of the vagina, cervix, and uterus. The cervix is the lower part of the uterus that opens into the vagina.  · Infection.  · Changes that may be a sign that cancer is developing (precancerous changes).  Women need this test on a regular basis. In general, you should have a Pap test every 3 years until you reach menopause or age 55. Women aged 30-60 may choose to have their Pap test done at the same time as an HPV (human papillomavirus) test every 5 years (instead of every 3 years).  Your health care provider may recommend having Pap tests more or less often depending on your medical conditions and past Pap test results.  What kind of sample is taken?    Your health care provider will collect a sample of cells from the surface of your cervix. This will be done using a small cotton swab, plastic spatula, or brush. This sample is often collected during a pelvic exam, when you are lying on your back on an exam table with feet in footrests (stirrups).  In some cases, fluids (secretions) from the cervix or vagina may also be collected.  How do I prepare for this test?  · Be aware of where you are in your menstrual cycle. If you are menstruating on the day of the test, you may be asked to reschedule.  · You may need to reschedule if you have a known vaginal infection on the day of the test.  · Follow instructions from your health care provider about:  ? Changing or stopping your regular medicines. Some medicines can cause abnormal test results, such as digitalis and tetracycline.  ? Avoiding douching or taking a bath the day before or the day of the test.  Tell a health care provider about:  · Any allergies you have.  · All medicines you are taking, including vitamins, herbs, eye drops, creams, and over-the-counter medicines.  · Any blood disorders you have.  · Any surgeries you have had.  · Any  medical conditions you have.  · Whether you are pregnant or may be pregnant.  How are the results reported?  Your test results will be reported as either abnormal or normal.  A false-positive result can occur. A false positive is incorrect because it means that a condition is present when it is not.  A false-negative result can occur. A false negative is incorrect because it means that a condition is not present when it is.  What do the results mean?  A normal test result means that you do not have signs of cancer of the vagina, cervix, or uterus.  An abnormal result may mean that you have:  · Cancer. A Pap test by itself is not enough to diagnose cancer. You will have more tests done in this case.  · Precancerous changes in your vagina, cervix, or uterus.  · Inflammation of the cervix.  · An STD (sexually transmitted disease).  · A fungal infection.  · A parasite infection.  Talk with your health care provider about what your results mean.  Questions to ask your health care provider  Ask your health care provider, or the department that is doing the test:  · When will my results be ready?  · How will I get my results?  · What are my   treatment options?  · What other tests do I need?  · What are my next steps?  Summary  · In general, women should have a Pap test every 3 years until they reach menopause or age 55.  · Your health care provider will collect a sample of cells from the surface of your cervix. This will be done using a small cotton swab, plastic spatula, or brush.  · In some cases, fluids (secretions) from the cervix or vagina may also be collected.  This information is not intended to replace advice given to you by your health care provider. Make sure you discuss any questions you have with your health care provider.  Document Released: 11/24/2002 Document Revised: 05/13/2017 Document Reviewed: 05/13/2017  Elsevier Interactive Patient Education © 2019 [employer]

## 2018-09-15 NOTE — Progress Notes (Signed)
Subjective:    Patient ID: Pamela Francis, female    DOB: 08-26-1961, 57 y.o.   MRN: [id number]  HPI  Pt presents to the clinic today with c/o vaginal discharge. She noticed this 1 week ago. The discharge is pale yellow with an odor. She is having some vaginal itching and burning. She denies abnormal uterine bleeding. She denies urinary symptoms. Her last pap smear was 08/2017, normal but HPV 16 positive. She did try Monistat 3 day OTC with minimal relief of symptoms. She is sexually active.  Review of Systems      Past Medical History:  Diagnosis Date  . Allergy   . Anxiety   . Arthritis   . Blood in stool   . Chicken pox   . Depression   . Frequent headaches   . Hyperlipidemia   . Urine incontinence     Current Outpatient Medications  Medication Sig Dispense Refill  . Calcium Carb-Cholecalciferol (CALCIUM 600 + D PO) Take 1 tablet by mouth daily.    . Cholecalciferol (VITAMIN D PO) Take 2,000 tablets by mouth daily.    . cyanocobalamin 1000 MCG tablet Take 1,000 mcg by mouth daily.    . fexofenadine (ALLEGRA) 180 MG tablet Take 180 mg by mouth daily.    . fluticasone (FLONASE) 50 MCG/ACT nasal spray Place 2 sprays into both nostrils daily. 16 g 6  . hydrocortisone (ANUSOL-HC) 25 MG suppository PLACE 1 SUPPOSITORY (25 MG TOTAL) RECTALLY 2 (TWO) TIMES DAILY. (Patient not taking: Reported on 11/18/2017) 12 suppository 0  . montelukast (SINGULAIR) 10 MG tablet Take 1 tablet (10 mg total) by mouth at bedtime. MUST SCHEDULE PHYSICAL EXAM 90 tablet 0  . oxybutynin (DITROPAN-XL) 10 MG 24 hr tablet Take 1 tablet (10 mg total) by mouth daily. MUST SCHEDULE ANNUAL EXAM 90 tablet 0  . sertraline (ZOLOFT) 50 MG tablet Take 1 tablet (50 mg total) by mouth daily. MUST SCHEDULE ANNUAL PHYSICAL 90 tablet 0  . simvastatin (ZOCOR) 20 MG tablet Take 1 tablet (20 mg total) by mouth daily at 6 PM. MUST SCHEDULE ANNUAL EXAM 90 tablet 0   Current Facility-Administered Medications  Medication Dose  Route Frequency Provider Last Rate Last Dose  . 0.9 %  sodium chloride infusion  500 mL Intravenous Once Pyrtle, Lajuan Lines, MD        No Known Allergies  Family History  Problem Relation Age of Onset  . Arthritis Mother   . Hyperlipidemia Mother   . Hypertension Mother   . Colon polyps Mother   . Cancer Father        Lung  . Cancer Maternal Uncle        Prostate  . Prostate cancer Maternal Uncle   . Rheum arthritis Maternal Grandmother   . Stroke Maternal Grandmother   . Alcohol abuse Maternal Grandfather   . Breast cancer Cousin   . Colon cancer Neg Hx   . Esophageal cancer Neg Hx   . Pancreatic cancer Neg Hx   . Stomach cancer Neg Hx   . Rectal cancer Neg Hx   . Liver cancer Neg Hx     Social History   Socioeconomic History  . Marital status: Single    Spouse name: Not on file  . Number of children: Not on file  . Years of education: Not on file  . Highest education level: Not on file  Occupational History  . Not on file  Social Needs  . Financial resource strain: Not on file  .  Food insecurity:    Worry: Not on file    Inability: Not on file  . Transportation needs:    Medical: Not on file    Non-medical: Not on file  Tobacco Use  . Smoking status: Never Smoker  . Smokeless tobacco: Never Used  Substance and Sexual Activity  . Alcohol use: Yes    Alcohol/week: 0.0 standard drinks    Comment: occasional  . Drug use: No  . Sexual activity: Yes  Lifestyle  . Physical activity:    Days per week: Not on file    Minutes per session: Not on file  . Stress: Not on file  Relationships  . Social connections:    Talks on phone: Not on file    Gets together: Not on file    Attends religious service: Not on file    Active member of club or organization: Not on file    Attends meetings of clubs or organizations: Not on file    Relationship status: Not on file  . Intimate partner violence:    Fear of current or ex partner: Not on file    Emotionally abused: Not  on file    Physically abused: Not on file    Forced sexual activity: Not on file  Other Topics Concern  . Not on file  Social History Narrative  . Not on file     Constitutional: Denies fever, malaise, fatigue, headache or abrupt weight changes.  HEENT: Denies eye pain, eye redness, ear pain, ringing in the ears, wax buildup, runny nose, nasal congestion, bloody nose, or sore throat. Respiratory: Denies difficulty breathing, shortness of breath, cough or sputum production.   Cardiovascular: Denies chest pain, chest tightness, palpitations or swelling in the hands or feet.  Gastrointestinal: Denies abdominal pain, bloating, constipation, diarrhea or blood in the stool.  GU: Pt reports vaginal discharge, itching and burning sensation. Denies urgency, frequency, pain with urination, blood in urine.  No other specific complaints in a complete review of systems (except as listed in HPI above).  Objective:   Physical Exam  Wt 174 lb (78.9 kg)   LMP  (LMP Unknown)   BMI 30.10 kg/m  Wt Readings from Last 3 Encounters:  09/15/18 174 lb (78.9 kg)  11/18/17 170 lb 6.4 oz (77.3 kg)  09/02/17 166 lb 6.4 oz (75.5 kg)    General: Appears her stated age, well developed, well nourished in NAD. Abdomen: Soft and nontender. Normal bowel sounds. No distention or masses noted.  Pelvic: Normal female anatomy. Cervix friable without changes. Small amount of pale yellow discharge noted. No odor present. No CMT. Adnexa non palpable.  BMET    Component Value Date/Time   NA 139 09/02/2017 1152   K 3.8 09/02/2017 1152   CL 104 09/02/2017 1152   CO2 28 09/02/2017 1152   GLUCOSE 94 09/02/2017 1152   BUN 10 09/02/2017 1152   CREATININE 0.80 09/02/2017 1152   CREATININE 0.87 04/03/2016 1042   CALCIUM 8.9 09/02/2017 1152    Lipid Panel     Component Value Date/Time   CHOL 148 09/02/2017 1152   TRIG 101.0 09/02/2017 1152   HDL 58.40 09/02/2017 1152   CHOLHDL 3 09/02/2017 1152   VLDL 20.2  09/02/2017 1152   LDLCALC 69 09/02/2017 1152    CBC    Component Value Date/Time   WBC 5.7 09/02/2017 1152   RBC 4.30 09/02/2017 1152   HGB 13.1 09/02/2017 1152   HCT 39.5 09/02/2017 1152   PLT  267.0 09/02/2017 1152   MCV 91.8 09/02/2017 1152   MCH 29.6 04/03/2016 1042   MCHC 33.2 09/02/2017 1152   RDW 12.8 09/02/2017 1152   LYMPHSABS 1,800 04/03/2016 1042   MONOABS 480 04/03/2016 1042   EOSABS 360 04/03/2016 1042   BASOSABS 0 04/03/2016 1042    Hgb A1C No results found for: HGBA1C          Assessment & Plan:   Abnormal Pap Smear of Cervix, Vaginal Discharge, Vaginal Irritation, Screen for STD:  Pap smear today with STD screening  Will call you with results and treat anything that may show up Webb Silversmith, NP

## 2018-09-19 ENCOUNTER — Encounter: Payer: Self-pay | Admitting: Internal Medicine

## 2018-09-19 ENCOUNTER — Telehealth: Payer: Self-pay

## 2018-09-19 ENCOUNTER — Other Ambulatory Visit: Payer: Self-pay | Admitting: Internal Medicine

## 2018-09-19 LAB — CYTOLOGY - PAP
BACTERIAL VAGINITIS: NEGATIVE
CANDIDA VAGINITIS: NEGATIVE
Chlamydia: NEGATIVE
Diagnosis: NEGATIVE
Neisseria Gonorrhea: NEGATIVE
TRICH (WINDOWPATH): POSITIVE — AB

## 2018-09-19 MED ORDER — METRONIDAZOLE 500 MG PO TABS: 2000.0000 mg | ORAL_TABLET | Freq: Once | ORAL | 0 refills | Status: AC

## 2018-09-19 NOTE — Telephone Encounter (Signed)
Pt request cb about lab results done on 09/15/18.

## 2018-09-19 NOTE — Telephone Encounter (Signed)
See result note.  

## 2018-09-30 ENCOUNTER — Encounter: Payer: Self-pay | Admitting: Internal Medicine

## 2018-10-20 ENCOUNTER — Ambulatory Visit: Payer: BLUE CROSS/BLUE SHIELD | Admitting: Internal Medicine

## 2018-10-20 ENCOUNTER — Encounter: Payer: Self-pay | Admitting: Internal Medicine

## 2018-10-20 VITALS — BP 120/80 | HR 69 | Temp 97.9°F | Wt 174.0 lb

## 2018-10-20 DIAGNOSIS — Z113 Encounter for screening for infections with a predominantly sexual mode of transmission: Secondary | ICD-10-CM

## 2018-10-20 DIAGNOSIS — A599 Trichomoniasis, unspecified: Secondary | ICD-10-CM | POA: Diagnosis not present

## 2018-10-20 NOTE — Progress Notes (Addendum)
Subjective:    Patient ID: Pamela Francis, female    DOB: December 20, 1960, 58 y.o.   MRN: [id number]  HPI  Pt presents to the clinic today for [facility] for trichomonas. She was treated with Flagyl 09/15/2018. She is asymptomatic at this time.  Review of Systems      Past Medical History:  Diagnosis Date  . Allergy   . Anxiety   . Arthritis   . Blood in stool   . Chicken pox   . Depression   . Frequent headaches   . Hyperlipidemia   . Urine incontinence     Current Outpatient Medications  Medication Sig Dispense Refill  . Calcium Carb-Cholecalciferol (CALCIUM 600 + D PO) Take 1 tablet by mouth daily.    . Cholecalciferol (VITAMIN D PO) Take 2,000 tablets by mouth daily.    . cyanocobalamin 1000 MCG tablet Take 1,000 mcg by mouth daily.    . fexofenadine (ALLEGRA) 180 MG tablet Take 180 mg by mouth daily.    . fluticasone (FLONASE) 50 MCG/ACT nasal spray Place 2 sprays into both nostrils daily. 16 g 6  . hydrocortisone (ANUSOL-HC) 25 MG suppository PLACE 1 SUPPOSITORY (25 MG TOTAL) RECTALLY 2 (TWO) TIMES DAILY. 12 suppository 0  . montelukast (SINGULAIR) 10 MG tablet Take 1 tablet (10 mg total) by mouth at bedtime. MUST SCHEDULE PHYSICAL EXAM 90 tablet 0  . oxybutynin (DITROPAN-XL) 10 MG 24 hr tablet Take 1 tablet (10 mg total) by mouth daily. MUST SCHEDULE ANNUAL EXAM 90 tablet 0  . sertraline (ZOLOFT) 50 MG tablet Take 1 tablet (50 mg total) by mouth daily. MUST SCHEDULE ANNUAL PHYSICAL 90 tablet 0  . simvastatin (ZOCOR) 20 MG tablet Take 1 tablet (20 mg total) by mouth daily at 6 PM. MUST SCHEDULE ANNUAL EXAM 90 tablet 0   Current Facility-Administered Medications  Medication Dose Route Frequency Provider Last Rate Last Dose  . 0.9 %  sodium chloride infusion  500 mL Intravenous Once Pyrtle, Lajuan Lines, MD        No Known Allergies  Family History  Problem Relation Age of Onset  . Arthritis Mother   . Hyperlipidemia Mother   . Hypertension Mother   . Colon polyps Mother   .  Cancer Father        Lung  . Cancer Maternal Uncle        Prostate  . Prostate cancer Maternal Uncle   . Rheum arthritis Maternal Grandmother   . Stroke Maternal Grandmother   . Alcohol abuse Maternal Grandfather   . Breast cancer Cousin   . Colon cancer Neg Hx   . Esophageal cancer Neg Hx   . Pancreatic cancer Neg Hx   . Stomach cancer Neg Hx   . Rectal cancer Neg Hx   . Liver cancer Neg Hx     Social History   Socioeconomic History  . Marital status: Single    Spouse name: Not on file  . Number of children: Not on file  . Years of education: Not on file  . Highest education level: Not on file  Occupational History  . Not on file  Social Needs  . Financial resource strain: Not on file  . Food insecurity:    Worry: Not on file    Inability: Not on file  . Transportation needs:    Medical: Not on file    Non-medical: Not on file  Tobacco Use  . Smoking status: Never Smoker  . Smokeless tobacco: Never Used  Substance and Sexual Activity  . Alcohol use: Yes    Alcohol/week: 0.0 standard drinks    Comment: occasional  . Drug use: No  . Sexual activity: Yes  Lifestyle  . Physical activity:    Days per week: Not on file    Minutes per session: Not on file  . Stress: Not on file  Relationships  . Social connections:    Talks on phone: Not on file    Gets together: Not on file    Attends religious service: Not on file    Active member of club or organization: Not on file    Attends meetings of clubs or organizations: Not on file    Relationship status: Not on file  . Intimate partner violence:    Fear of current or ex partner: Not on file    Emotionally abused: Not on file    Physically abused: Not on file    Forced sexual activity: Not on file  Other Topics Concern  . Not on file  Social History Narrative  . Not on file     Constitutional: Denies fever, malaise, fatigue, headache or abrupt weight changes.  Respiratory: Denies difficulty breathing,  shortness of breath, cough or sputum production.   Cardiovascular: Denies chest pain, chest tightness, palpitations or swelling in the hands or feet.  Gastrointestinal: Denies abdominal pain, bloating, constipation, diarrhea or blood in the stool.  GU: Denies urgency, frequency, pain with urination, burning sensation, blood in urine, odor or discharge.  No other specific complaints in a complete review of systems (except as listed in HPI above).  Objective:   Physical Exam   BP 120/80   Pulse 69   Temp 97.9 F (36.6 C) (Oral)   Wt 174 lb (78.9 kg)   LMP  (LMP Unknown)   SpO2 98%   BMI 30.10 kg/m  Wt Readings from Last 3 Encounters:  10/20/18 174 lb (78.9 kg)  09/15/18 174 lb (78.9 kg)  11/18/17 170 lb 6.4 oz (77.3 kg)    General: Appears her stated age, well developed, well nourished in NAD. Abdomen: Soft and nontender. Normal bowel sounds. No distention or masses noted. Liver, spleen and kidneys non palpable. Pelvic: Self swabbed.  BMET    Component Value Date/Time   NA 139 09/02/2017 1152   K 3.8 09/02/2017 1152   CL 104 09/02/2017 1152   CO2 28 09/02/2017 1152   GLUCOSE 94 09/02/2017 1152   BUN 10 09/02/2017 1152   CREATININE 0.80 09/02/2017 1152   CREATININE 0.87 04/03/2016 1042   CALCIUM 8.9 09/02/2017 1152    Lipid Panel     Component Value Date/Time   CHOL 148 09/02/2017 1152   TRIG 101.0 09/02/2017 1152   HDL 58.40 09/02/2017 1152   CHOLHDL 3 09/02/2017 1152   VLDL 20.2 09/02/2017 1152   LDLCALC 69 09/02/2017 1152    CBC    Component Value Date/Time   WBC 5.7 09/02/2017 1152   RBC 4.30 09/02/2017 1152   HGB 13.1 09/02/2017 1152   HCT 39.5 09/02/2017 1152   PLT 267.0 09/02/2017 1152   MCV 91.8 09/02/2017 1152   MCH 29.6 04/03/2016 1042   MCHC 33.2 09/02/2017 1152   RDW 12.8 09/02/2017 1152   LYMPHSABS 1,800 04/03/2016 1042   MONOABS 480 04/03/2016 1042   EOSABS 360 04/03/2016 1042   BASOSABS 0 04/03/2016 1042    Hgb A1C No results found  for: HGBA1C         Assessment & Plan:  Test of Cure, Screen for STD:  Wet prep to send off today Discussed safe sexual practices  Will follow up after results are back Webb Silversmith, NP

## 2018-10-20 NOTE — Patient Instructions (Signed)
Trichomonas Test  Why am I having this test?  The trichomonas test is done to diagnose trichomoniasis, a sexually-transmitted infection (STI) caused by an organism called Trichomonas. You may have this test as a part of a routine screening for STIs or if you have symptoms of trichomoniasis.  What kind of sample is taken?  To perform the test, your health care provider will either ask you to provide a urine sample or take a sample of discharge. The sample will be taken from the vagina or cervix in women and from the urethra in men.  How are the results reported?  Your test results will be reported as either positive or negative. It is up to you to get your test results. Ask your health care provider, or the department that is doing the test, when your results will be ready.  What do the results mean?  Negative test result  A negative result means that you do not have trichomoniasis. Follow your health care provider's directions about any follow-up testing.  Positive test result  A positive result means that you have an active infection that needs to be treated with antibiotic medicine. All your current sexual partners must also be treated. If they are not, you will likely get reinfected.  If your test result is positive, your health care provider will start you on medicine and may advise you:   Not to have sex until your infection has cleared up.   To use a latex condom properly every time you have sex.   To limit the number of sexual partners you have. The more partners you have, the greater your risk of contracting trichomoniasis or another STI.   To tell all sexual partners about your infection so that they can also get tested and treated. This will prevent reinfection.  Talk with your health care provider to discuss your results, treatment options, and if necessary, the need for more tests. Talk with your health care provider if you have any questions about your results.  This information is not intended to  replace advice given to you by your health care provider. Make sure you discuss any questions you have with your health care provider.  Document Released: 10/06/2004 Document Revised: 10/30/2016 Document Reviewed: 10/30/2016  Elsevier Interactive Patient Education  2019 [employer]

## 2018-10-20 NOTE — Addendum Note (Signed)
Addended by: Lurlean Nanny on: 10/20/2018 12:20 PM   Modules accepted: Orders

## 2018-10-21 LAB — WET PREP BY MOLECULAR PROBE
Candida species: NOT DETECTED
Gardnerella vaginalis: NOT DETECTED
MICRO NUMBER: 141718
SPECIMEN QUALITY: ADEQUATE
TRICHOMONAS VAG: NOT DETECTED

## 2018-11-03 ENCOUNTER — Ambulatory Visit (INDEPENDENT_AMBULATORY_CARE_PROVIDER_SITE_OTHER): Payer: BLUE CROSS/BLUE SHIELD | Admitting: Internal Medicine

## 2018-11-03 ENCOUNTER — Encounter: Payer: Self-pay | Admitting: Internal Medicine

## 2018-11-03 VITALS — BP 122/80 | HR 67 | Temp 97.9°F | Ht 63.5 in | Wt 170.0 lb

## 2018-11-03 DIAGNOSIS — M47819 Spondylosis without myelopathy or radiculopathy, site unspecified: Secondary | ICD-10-CM

## 2018-11-03 DIAGNOSIS — R51 Headache: Secondary | ICD-10-CM | POA: Diagnosis not present

## 2018-11-03 DIAGNOSIS — Z23 Encounter for immunization: Secondary | ICD-10-CM

## 2018-11-03 DIAGNOSIS — R519 Headache, unspecified: Secondary | ICD-10-CM

## 2018-11-03 DIAGNOSIS — E7800 Pure hypercholesterolemia, unspecified: Secondary | ICD-10-CM | POA: Diagnosis not present

## 2018-11-03 DIAGNOSIS — Z0000 Encounter for general adult medical examination without abnormal findings: Secondary | ICD-10-CM | POA: Diagnosis not present

## 2018-11-03 DIAGNOSIS — F329 Major depressive disorder, single episode, unspecified: Secondary | ICD-10-CM

## 2018-11-03 DIAGNOSIS — F419 Anxiety disorder, unspecified: Secondary | ICD-10-CM

## 2018-11-03 NOTE — Patient Instructions (Signed)
Health Maintenance for Postmenopausal Women Menopause is a normal process in which your reproductive ability comes to an end. This process happens gradually over a span of months to years, usually between the ages of 62 and 89. Menopause is complete when you have missed 12 consecutive menstrual periods. It is important to talk with your health care provider about some of the most common conditions that affect postmenopausal women, such as heart disease, cancer, and bone loss (osteoporosis). Adopting a healthy lifestyle and getting preventive care can help to promote your health and wellness. Those actions can also lower your chances of developing some of these common conditions. What should I know about menopause? During menopause, you may experience a number of symptoms, such as:  Moderate-to-severe hot flashes.  Night sweats.  Decrease in sex drive.  Mood swings.  Headaches.  Tiredness.  Irritability.  Memory problems.  Insomnia. Choosing to treat or not to treat menopausal changes is an individual decision that you make with your health care provider. What should I know about hormone replacement therapy and supplements? Hormone therapy products are effective for treating symptoms that are associated with menopause, such as hot flashes and night sweats. Hormone replacement carries certain risks, especially as you become older. If you are thinking about using estrogen or estrogen with progestin treatments, discuss the benefits and risks with your health care provider. What should I know about heart disease and stroke? Heart disease, heart attack, and stroke become more likely as you age. This may be due, in part, to the hormonal changes that your body experiences during menopause. These can affect how your body processes dietary fats, triglycerides, and cholesterol. Heart attack and stroke are both medical emergencies. There are many things that you can do to help prevent heart disease  and stroke:  Have your blood pressure checked at least every 1-2 years. High blood pressure causes heart disease and increases the risk of stroke.  If you are 79-72 years old, ask your health care provider if you should take aspirin to prevent a heart attack or a stroke.  Do not use any tobacco products, including cigarettes, chewing tobacco, or electronic cigarettes. If you need help quitting, ask your health care provider.  It is important to eat a healthy diet and maintain a healthy weight. ? Be sure to include plenty of vegetables, fruits, low-fat dairy products, and lean protein. ? Avoid eating foods that are high in solid fats, added sugars, or salt (sodium).  Get regular exercise. This is one of the most important things that you can do for your health. ? Try to exercise for at least 150 minutes each week. The type of exercise that you do should increase your heart rate and make you sweat. This is known as moderate-intensity exercise. ? Try to do strengthening exercises at least twice each week. Do these in addition to the moderate-intensity exercise.  Know your numbers.Ask your health care provider to check your cholesterol and your blood glucose. Continue to have your blood tested as directed by your health care provider.  What should I know about cancer screening? There are several types of cancer. Take the following steps to reduce your risk and to catch any cancer development as early as possible. Breast Cancer  Practice breast self-awareness. ? This means understanding how your breasts normally appear and feel. ? It also means doing regular breast self-exams. Let your health care provider know about any changes, no matter how small.  If you are 40 or  older, have a clinician do a breast exam (clinical breast exam or CBE) every year. Depending on your age, family history, and medical history, it may be recommended that you also have a yearly breast X-ray (mammogram).  If you  have a family history of breast cancer, talk with your health care provider about genetic screening.  If you are at high risk for breast cancer, talk with your health care provider about having an MRI and a mammogram every year.  Breast cancer (BRCA) gene test is recommended for women who have family members with BRCA-related cancers. Results of the assessment will determine the need for genetic counseling and BRCA1 and for BRCA2 testing. BRCA-related cancers include these types: ? Breast. This occurs in males or females. ? Ovarian. ? Tubal. This may also be called fallopian tube cancer. ? Cancer of the abdominal or pelvic lining (peritoneal cancer). ? Prostate. ? Pancreatic. Cervical, Uterine, and Ovarian Cancer Your health care provider may recommend that you be screened regularly for cancer of the pelvic organs. These include your ovaries, uterus, and vagina. This screening involves a pelvic exam, which includes checking for microscopic changes to the surface of your cervix (Pap test).  For women ages 21-65, health care providers may recommend a pelvic exam and a Pap test every three years. For women ages 39-65, they may recommend the Pap test and pelvic exam, combined with testing for human papilloma virus (HPV), every five years. Some types of HPV increase your risk of cervical cancer. Testing for HPV may also be done on women of any age who have unclear Pap test results.  Other health care providers may not recommend any screening for nonpregnant women who are considered low risk for pelvic cancer and have no symptoms. Ask your health care provider if a screening pelvic exam is right for you.  If you have had past treatment for cervical cancer or a condition that could lead to cancer, you need Pap tests and screening for cancer for at least 20 years after your treatment. If Pap tests have been discontinued for you, your risk factors (such as having a new sexual partner) need to be reassessed  to determine if you should start having screenings again. Some women have medical problems that increase the chance of getting cervical cancer. In these cases, your health care provider may recommend that you have screening and Pap tests more often.  If you have a family history of uterine cancer or ovarian cancer, talk with your health care provider about genetic screening.  If you have vaginal bleeding after reaching menopause, tell your health care provider.  There are currently no reliable tests available to screen for ovarian cancer. Lung Cancer Lung cancer screening is recommended for adults 57-50 years old who are at high risk for lung cancer because of a history of smoking. A yearly low-dose CT scan of the lungs is recommended if you:  Currently smoke.  Have a history of at least 30 pack-years of smoking and you currently smoke or have quit within the past 15 years. A pack-year is smoking an average of one pack of cigarettes per day for one year. Yearly screening should:  Continue until it has been 15 years since you quit.  Stop if you develop a health problem that would prevent you from having lung cancer treatment. Colorectal Cancer  This type of cancer can be detected and can often be prevented.  Routine colorectal cancer screening usually begins at age 12 and continues through  age 75.  If you have risk factors for colon cancer, your health care provider may recommend that you be screened at an earlier age.  If you have a family history of colorectal cancer, talk with your health care provider about genetic screening.  Your health care provider may also recommend using home test kits to check for hidden blood in your stool.  A small camera at the end of a tube can be used to examine your colon directly (sigmoidoscopy or colonoscopy). This is done to check for the earliest forms of colorectal cancer.  Direct examination of the colon should be repeated every 5-10 years until  age 75. However, if early forms of precancerous polyps or small growths are found or if you have a family history or genetic risk for colorectal cancer, you may need to be screened more often. Skin Cancer  Check your skin from head to toe regularly.  Monitor any moles. Be sure to tell your health care provider: ? About any new moles or changes in moles, especially if there is a change in a mole's shape or color. ? If you have a mole that is larger than the size of a pencil eraser.  If any of your family members has a history of skin cancer, especially at a young age, talk with your health care provider about genetic screening.  Always use sunscreen. Apply sunscreen liberally and repeatedly throughout the day.  Whenever you are outside, protect yourself by wearing long sleeves, pants, a wide-brimmed hat, and sunglasses. What should I know about osteoporosis? Osteoporosis is a condition in which bone destruction happens more quickly than new bone creation. After menopause, you may be at an increased risk for osteoporosis. To help prevent osteoporosis or the bone fractures that can happen because of osteoporosis, the following is recommended:  If you are 19-50 years old, get at least 1,000 mg of calcium and at least 600 mg of vitamin D per day.  If you are older than age 50 but younger than age 70, get at least 1,200 mg of calcium and at least 600 mg of vitamin D per day.  If you are older than age 70, get at least 1,200 mg of calcium and at least 800 mg of vitamin D per day. Smoking and excessive alcohol intake increase the risk of osteoporosis. Eat foods that are rich in calcium and vitamin D, and do weight-bearing exercises several times each week as directed by your health care provider. What should I know about how menopause affects my mental health? Depression may occur at any age, but it is more common as you become older. Common symptoms of depression include:  Low or sad  mood.  Changes in sleep patterns.  Changes in appetite or eating patterns.  Feeling an overall lack of motivation or enjoyment of activities that you previously enjoyed.  Frequent crying spells. Talk with your health care provider if you think that you are experiencing depression. What should I know about immunizations? It is important that you get and maintain your immunizations. These include:  Tetanus, diphtheria, and pertussis (Tdap) booster vaccine.  Influenza every year before the flu season begins.  Pneumonia vaccine.  Shingles vaccine. Your health care provider may also recommend other immunizations. This information is not intended to replace advice given to you by your health care provider. Make sure you discuss any questions you have with your health care provider. Document Released: 10/26/2005 Document Revised: 03/23/2016 Document Reviewed: 06/07/2015 Elsevier Interactive Patient Education    2019 [place].

## 2018-11-04 LAB — COMPREHENSIVE METABOLIC PANEL
ALT: 18 U/L (ref 0–35)
AST: 23 U/L (ref 0–37)
Albumin: 4.4 g/dL (ref 3.5–5.2)
Alkaline Phosphatase: 43 U/L (ref 39–117)
BUN: 16 mg/dL (ref 6–23)
CHLORIDE: 105 meq/L (ref 96–112)
CO2: 25 mEq/L (ref 19–32)
Calcium: 9 mg/dL (ref 8.4–10.5)
Creatinine, Ser: 0.78 mg/dL (ref 0.40–1.20)
GFR: 76.47 mL/min (ref >60.00)
GLUCOSE: 84 mg/dL (ref 70–99)
Potassium: 3.8 mEq/L (ref 3.5–5.1)
Sodium: 139 mEq/L (ref 135–145)
Total Bilirubin: 0.4 mg/dL (ref 0.2–1.2)
Total Protein: 7 g/dL (ref 6.0–8.3)

## 2018-11-04 LAB — CBC
HCT: 40.5 % (ref 36.0–46.0)
Hemoglobin: 13.5 g/dL (ref 12.0–15.0)
MCHC: 33.4 g/dL (ref 30.0–36.0)
MCV: 90.5 fl (ref 78.0–100.0)
Platelets: 280 10*3/uL (ref 150.0–400.0)
RBC: 4.47 Mil/uL (ref 3.87–5.11)
RDW: 12.6 % (ref 11.5–15.5)
WBC: 6.3 10*3/uL (ref 4.0–10.5)

## 2018-11-04 LAB — LIPID PANEL
Cholesterol: 165 mg/dL (ref 0–200)
HDL: 60.9 mg/dL (ref >39.00)
LDL CALC: 93 mg/dL (ref 0–99)
NonHDL: 104.56
Total CHOL/HDL Ratio: 3
Triglycerides: 56 mg/dL (ref 0.0–149.0)
VLDL: 11.2 mg/dL (ref 0.0–40.0)

## 2018-11-04 LAB — VITAMIN B12: Vitamin B-12: 353 pg/mL (ref 211–911)

## 2018-11-04 LAB — VITAMIN D 25 HYDROXY (VIT D DEFICIENCY, FRACTURES): VITD: 28.28 ng/mL — ABNORMAL LOW (ref 30.00–100.00)

## 2018-11-04 LAB — TSH: TSH: 3.76 u[IU]/mL (ref 0.35–4.50)

## 2018-11-06 DIAGNOSIS — M1990 Unspecified osteoarthritis, unspecified site: Secondary | ICD-10-CM | POA: Insufficient documentation

## 2018-11-06 NOTE — Progress Notes (Signed)
Subjective:    Patient ID: Pamela Francis, female    DOB: December 05, 1960, 58 y.o.   MRN: [id number]  HPI  Pt presents to the clinic today for her annual exam. She is also due to follow up chronic conditions.  Anxiety and Depression: Chronic but stable on Sertraline. She denies SI/HI.  Frequent Headaches: These occur almost daily. She takes Ibuprofen or Tylenol as needed with some relief.  Osteoarthritis: Mainly in her back. She takes Ibuprofen and Tylenol as needed with good relief.  HLD: Her last LDL was 69, 08/2017. She denies myalgias on Simvastatin. She does not consume a low fat diet.  OAB: Controlled with Ditropan.   Flu: 06/2018 Tetanus: 01/2008 Pap Smear: 08/2018 Mammogram: 05/2018 Colon Screening: 11/2017 Vision Screening: annually Dentist: biannually  Diet: She does eat meat. She consumes fruits and veggies daily. She does eat fried foods. She drinks mostly water, tea, coffee. Exercise: None  Review of Systems      Past Medical History:  Diagnosis Date  . Allergy   . Anxiety   . Arthritis   . Blood in stool   . Chicken pox   . Depression   . Frequent headaches   . Hyperlipidemia   . Urine incontinence     Current Outpatient Medications  Medication Sig Dispense Refill  . Calcium Carb-Cholecalciferol (CALCIUM 600 + D PO) Take 1 tablet by mouth daily.    . Cholecalciferol (VITAMIN D PO) Take 2,000 tablets by mouth daily.    . cyanocobalamin 1000 MCG tablet Take 1,000 mcg by mouth daily.    . fexofenadine (ALLEGRA) 180 MG tablet Take 180 mg by mouth daily.    . fluticasone (FLONASE) 50 MCG/ACT nasal spray Place 2 sprays into both nostrils daily. 16 g 6  . hydrocortisone (ANUSOL-HC) 25 MG suppository PLACE 1 SUPPOSITORY (25 MG TOTAL) RECTALLY 2 (TWO) TIMES DAILY. 12 suppository 0  . montelukast (SINGULAIR) 10 MG tablet Take 1 tablet (10 mg total) by mouth at bedtime. MUST SCHEDULE PHYSICAL EXAM 90 tablet 0  . oxybutynin (DITROPAN-XL) 10 MG 24 hr tablet Take 1  tablet (10 mg total) by mouth daily. MUST SCHEDULE ANNUAL EXAM 90 tablet 0  . sertraline (ZOLOFT) 50 MG tablet Take 1 tablet (50 mg total) by mouth daily. MUST SCHEDULE ANNUAL PHYSICAL 90 tablet 0  . simvastatin (ZOCOR) 20 MG tablet Take 1 tablet (20 mg total) by mouth daily at 6 PM. MUST SCHEDULE ANNUAL EXAM 90 tablet 0   Current Facility-Administered Medications  Medication Dose Route Frequency Provider Last Rate Last Dose  . 0.9 %  sodium chloride infusion  500 mL Intravenous Once Pyrtle, Lajuan Lines, MD        No Known Allergies  Family History  Problem Relation Age of Onset  . Arthritis Mother   . Hyperlipidemia Mother   . Hypertension Mother   . Colon polyps Mother   . Cancer Father        Lung  . Cancer Maternal Uncle        Prostate  . Prostate cancer Maternal Uncle   . Rheum arthritis Maternal Grandmother   . Stroke Maternal Grandmother   . Alcohol abuse Maternal Grandfather   . Breast cancer Cousin   . Colon cancer Neg Hx   . Esophageal cancer Neg Hx   . Pancreatic cancer Neg Hx   . Stomach cancer Neg Hx   . Rectal cancer Neg Hx   . Liver cancer Neg Hx     Social History  Socioeconomic History  . Marital status: Single    Spouse name: Not on file  . Number of children: Not on file  . Years of education: Not on file  . Highest education level: Not on file  Occupational History  . Not on file  Social Needs  . Financial resource strain: Not on file  . Food insecurity:    Worry: Not on file    Inability: Not on file  . Transportation needs:    Medical: Not on file    Non-medical: Not on file  Tobacco Use  . Smoking status: Never Smoker  . Smokeless tobacco: Never Used  Substance and Sexual Activity  . Alcohol use: Yes    Alcohol/week: 0.0 standard drinks    Comment: occasional  . Drug use: No  . Sexual activity: Yes  Lifestyle  . Physical activity:    Days per week: Not on file    Minutes per session: Not on file  . Stress: Not on file  Relationships   . Social connections:    Talks on phone: Not on file    Gets together: Not on file    Attends religious service: Not on file    Active member of club or organization: Not on file    Attends meetings of clubs or organizations: Not on file    Relationship status: Not on file  . Intimate partner violence:    Fear of current or ex partner: Not on file    Emotionally abused: Not on file    Physically abused: Not on file    Forced sexual activity: Not on file  Other Topics Concern  . Not on file  Social History Narrative  . Not on file     Constitutional: Pt reports intermittent headaches, fatigue. Denies fever, malaise, fatigue, or abrupt weight changes.  HEENT: Denies eye pain, eye redness, ear pain, ringing in the ears, wax buildup, runny nose, nasal congestion, bloody nose, or sore throat. Respiratory: Denies difficulty breathing, shortness of breath, cough or sputum production.   Cardiovascular: Denies chest pain, chest tightness, palpitations or swelling in the hands or feet.  Gastrointestinal: Denies abdominal pain, bloating, constipation, diarrhea or blood in the stool.  GU: Denies urgency, frequency, pain with urination, burning sensation, blood in urine, odor or discharge. Musculoskeletal: Pt reports low back pain. Denies decrease in range of motion, difficulty with gait, muscle pain or joint swelling.  Skin: Denies redness, rashes, lesions or ulcercations.  Neurological: Denies dizziness, difficulty with memory, difficulty with speech or problems with balance and coordination.  Psych: Pt has a history of anxiety and depression. Denies SI/HI.  No other specific complaints in a complete review of systems (except as listed in HPI above).  Objective:   Physical Exam  BP 122/80   Pulse 67   Temp 97.9 F (36.6 C) (Oral)   Ht 5' 3.5" (1.613 m)   Wt 170 lb (77.1 kg)   LMP  (LMP Unknown)   SpO2 99%   BMI 29.64 kg/m  Wt Readings from Last 3 Encounters:  11/03/18 170 lb  (77.1 kg)  10/20/18 174 lb (78.9 kg)  09/15/18 174 lb (78.9 kg)    General: Appears her stated age, well developed, well nourished in NAD. Skin: Warm, dry and intact.  HEENT: Head: normal shape and size; Eyes: sclera white, no icterus, conjunctiva pink, PERRLA and EOMs intact; Ears: Tm's gray and intact, normal light reflex; Throat/Mouth: Teeth present, mucosa pink and moist, no exudate, lesions or ulcerations  noted.  Neck:  Neck supple, trachea midline. No masses, lumps or thyromegaly present.  Cardiovascular: Normal rate and rhythm. S1,S2 noted.  No murmur, rubs or gallops noted. No JVD or BLE edema. No carotid bruits noted. Pulmonary/Chest: Normal effort and positive vesicular breath sounds. No respiratory distress. No wheezes, rales or ronchi noted.  Abdomen: Soft and nontender. Normal bowel sounds. No distention or masses noted. Liver, spleen and kidneys non palpable. Musculoskeletal: Strength 5/5 BUE/BLE. No difficulty with gait.  Neurological: Alert and oriented. Cranial nerves II-XII grossly intact. Coordination normal.  Psychiatric: Mood and affect normal. Behavior is normal. Judgment and thought content normal.    BMET    Component Value Date/Time   NA 139 11/03/2018 1534   K 3.8 11/03/2018 1534   CL 105 11/03/2018 1534   CO2 25 11/03/2018 1534   GLUCOSE 84 11/03/2018 1534   BUN 16 11/03/2018 1534   CREATININE 0.78 11/03/2018 1534   CREATININE 0.87 04/03/2016 1042   CALCIUM 9.0 11/03/2018 1534    Lipid Panel     Component Value Date/Time   CHOL 165 11/03/2018 1534   TRIG 56.0 11/03/2018 1534   HDL 60.90 11/03/2018 1534   CHOLHDL 3 11/03/2018 1534   VLDL 11.2 11/03/2018 1534   LDLCALC 93 11/03/2018 1534    CBC    Component Value Date/Time   WBC 6.3 11/03/2018 1534   RBC 4.47 11/03/2018 1534   HGB 13.5 11/03/2018 1534   HCT 40.5 11/03/2018 1534   PLT 280.0 11/03/2018 1534   MCV 90.5 11/03/2018 1534   MCH 29.6 04/03/2016 1042   MCHC 33.4 11/03/2018 1534    RDW 12.6 11/03/2018 1534   LYMPHSABS 1,800 04/03/2016 1042   MONOABS 480 04/03/2016 1042   EOSABS 360 04/03/2016 1042   BASOSABS 0 04/03/2016 1042    Hgb A1C No results found for: HGBA1C          Assessment & Plan:   Preventative Health Maintenance:  Flu shot UTD TD today Pap smear UTD Mammogram UTD Colon Screening UTD Encouraged her to consume a balanced diet and exercise regimen Advised her to see an eye doctor and dentist annually Will check CBC, CMET, Lipid, TSH, Vit D and B12 today  RTC in 1 year, sooner if needed Webb Silversmith, NP

## 2018-11-06 NOTE — Assessment & Plan Note (Signed)
Continue Tylenol or Ibuprofen as needed Will monitor

## 2018-11-06 NOTE — Assessment & Plan Note (Signed)
>>  ASSESSMENT AND PLAN FOR FREQUENT HEADACHES WRITTEN ON 11/06/2018 10:32 AM BY Lorre Munroe, NP  Continue Tylenol or Ibuprofen as needed Will monitor

## 2018-11-06 NOTE — Assessment & Plan Note (Signed)
Stable on Sertraline Support offered today

## 2018-11-06 NOTE — Assessment & Plan Note (Signed)
CMET and lipid profile today °Encouraged her to consume a low fat diet °Continue Simvastatin for now °

## 2018-11-28 ENCOUNTER — Other Ambulatory Visit: Payer: Self-pay | Admitting: Internal Medicine

## 2018-11-29 MED ORDER — SERTRALINE HCL 50 MG PO TABS: 50.0000 mg | ORAL_TABLET | Freq: Every day | ORAL | 2 refills | Status: DC

## 2018-11-29 MED ORDER — MONTELUKAST SODIUM 10 MG PO TABS: 10.0000 mg | ORAL_TABLET | Freq: Every day | ORAL | 2 refills | Status: DC

## 2018-11-29 MED ORDER — SIMVASTATIN 20 MG PO TABS: 20.0000 mg | ORAL_TABLET | Freq: Every day | ORAL | 2 refills | Status: DC

## 2018-11-29 MED ORDER — OXYBUTYNIN CHLORIDE ER 10 MG PO TB24: 10.0000 mg | ORAL_TABLET | Freq: Every day | ORAL | 2 refills | Status: DC

## 2018-12-02 MED ORDER — MONTELUKAST SODIUM 10 MG PO TABS: 10.0000 mg | ORAL_TABLET | Freq: Every day | ORAL | 2 refills | Status: DC

## 2018-12-02 MED ORDER — SIMVASTATIN 20 MG PO TABS: 20.0000 mg | ORAL_TABLET | Freq: Every day | ORAL | 2 refills | Status: DC

## 2018-12-02 MED ORDER — OXYBUTYNIN CHLORIDE ER 10 MG PO TB24: 10.0000 mg | ORAL_TABLET | Freq: Every day | ORAL | 2 refills | Status: DC

## 2018-12-02 MED ORDER — SERTRALINE HCL 50 MG PO TABS: 50.0000 mg | ORAL_TABLET | Freq: Every day | ORAL | 2 refills | Status: DC

## 2019-01-02 ENCOUNTER — Encounter: Payer: Self-pay | Admitting: Internal Medicine

## 2019-01-20 ENCOUNTER — Telehealth: Payer: Self-pay | Admitting: Internal Medicine

## 2019-01-20 NOTE — Telephone Encounter (Signed)
Best number [phone] Pt called stating she received  abill from [facility] she was with the understating she was going to have 1 test done but she was billed for 3.  She was billed for [id number]   trichimonal bagin dir pro  [phone]   G. paginalif  Dir pro [phone]   Candida vaginitis dir pro  The cost was 112 for each and she wanted to know what she was having all three test

## 2019-01-21 NOTE — Telephone Encounter (Signed)
Pamela Francis is checked off a wet prep. The wet prep happens to include testing for BV and yeast. I can't change what the lab tests for with each test.

## 2019-05-19 ENCOUNTER — Encounter: Payer: Self-pay | Admitting: Internal Medicine

## 2019-07-30 ENCOUNTER — Other Ambulatory Visit: Payer: Self-pay | Admitting: Internal Medicine

## 2019-07-30 DIAGNOSIS — Z1231 Encounter for screening mammogram for malignant neoplasm of breast: Secondary | ICD-10-CM

## 2019-07-31 ENCOUNTER — Ambulatory Visit
Admission: RE | Admit: 2019-07-31 | Discharge: 2019-07-31 | Disposition: A | Discharge status: Home / Self Care | Payer: BC Managed Care – PPO | Source: Ambulatory Visit | Attending: Internal Medicine | Admitting: Internal Medicine

## 2019-07-31 DIAGNOSIS — Z1231 Encounter for screening mammogram for malignant neoplasm of breast: Secondary | ICD-10-CM | POA: Insufficient documentation

## 2020-04-02 ENCOUNTER — Other Ambulatory Visit: Payer: Self-pay | Admitting: Internal Medicine

## 2020-08-29 ENCOUNTER — Other Ambulatory Visit: Payer: Self-pay | Admitting: Internal Medicine

## 2020-08-29 ENCOUNTER — Encounter: Payer: Self-pay | Admitting: Internal Medicine

## 2020-09-12 ENCOUNTER — Other Ambulatory Visit: Payer: Self-pay | Admitting: Internal Medicine

## 2020-09-20 ENCOUNTER — Other Ambulatory Visit: Payer: BC Managed Care – PPO

## 2020-09-27 ENCOUNTER — Ambulatory Visit (INDEPENDENT_AMBULATORY_CARE_PROVIDER_SITE_OTHER): Payer: BC Managed Care – PPO | Admitting: Internal Medicine

## 2020-09-27 ENCOUNTER — Other Ambulatory Visit: Payer: Self-pay

## 2020-09-27 ENCOUNTER — Encounter: Payer: Self-pay | Admitting: Internal Medicine

## 2020-09-27 VITALS — BP 122/80 | HR 62 | Temp 97.8°F | Ht 63.5 in | Wt 160.0 lb

## 2020-09-27 DIAGNOSIS — F32A Depression, unspecified: Secondary | ICD-10-CM

## 2020-09-27 DIAGNOSIS — Z0000 Encounter for general adult medical examination without abnormal findings: Secondary | ICD-10-CM

## 2020-09-27 DIAGNOSIS — M47819 Spondylosis without myelopathy or radiculopathy, site unspecified: Secondary | ICD-10-CM

## 2020-09-27 DIAGNOSIS — E7800 Pure hypercholesterolemia, unspecified: Secondary | ICD-10-CM | POA: Diagnosis not present

## 2020-09-27 DIAGNOSIS — Z0001 Encounter for general adult medical examination with abnormal findings: Secondary | ICD-10-CM

## 2020-09-27 DIAGNOSIS — E559 Vitamin D deficiency, unspecified: Secondary | ICD-10-CM

## 2020-09-27 DIAGNOSIS — F419 Anxiety disorder, unspecified: Secondary | ICD-10-CM | POA: Diagnosis not present

## 2020-09-27 DIAGNOSIS — R519 Headache, unspecified: Secondary | ICD-10-CM | POA: Diagnosis not present

## 2020-09-27 DIAGNOSIS — N3281 Overactive bladder: Secondary | ICD-10-CM

## 2020-09-27 DIAGNOSIS — Z780 Asymptomatic menopausal state: Secondary | ICD-10-CM

## 2020-09-27 MED ORDER — TOPIRAMATE 25 MG PO TABS: 25.0000 mg | ORAL_TABLET | Freq: Two times a day (BID) | ORAL | 1 refills | Status: DC

## 2020-09-27 NOTE — Assessment & Plan Note (Addendum)
Will trial Topamax 25 mg daily Continue Tylenol or Ibuprofen OTC

## 2020-09-27 NOTE — Assessment & Plan Note (Signed)
CMET and Lipid profile today Encouraged him to consume a low fat diet Continue Simvastatin 

## 2020-09-27 NOTE — Assessment & Plan Note (Signed)
>>  ASSESSMENT AND PLAN FOR FREQUENT HEADACHES WRITTEN ON 09/27/2020  2:53 PM BY Lorre Munroe, NP  Will trial Topamax 25 mg daily Continue Tylenol or Ibuprofen OTC

## 2020-09-27 NOTE — Progress Notes (Signed)
Subjective:    Patient ID: Pamela Francis, female    DOB: 26-Dec-1960, 60 y.o.   MRN: [id number]  HPI  Patient presents the clinic today for her annual exam.  She is also due to follow-up chronic conditions.  Anxiety and Depression: Persistent, managed on Sertraline. She does reports increased stress with her mother moving in with her. She is not currently seeing a therapist.  She denies SI/HI.  Frequent Headaches: These occur daily. She is not sure what triggers this.  She takes Tylenol or Ibuprofen OTC as needed with some relief of her symptoms.  OA: Mainly in her back. She takes Tylenol as needed with good relief of symptoms.  HLD: Her last LDL was 93, 10/2018. She denies myalgias on Simvastatin. She does not consume a low fat diet.  OAB: She reports stress incontinence. Controlled with Oxybutnin.   Flu: 06/2020 Tetanus: 10/2018 Covid: Moderna x 3 Pap Smear: 08/2018 Mammogram: 07/2019 Bone Density: never Colon Screening: 11/2017 Vision Screening: annually Dentist: biannually  Diet: She does eat meat. She consumes fruits and veggies daily. She tries to avoid fried foods. She drinks mostly unsweet tea, water. Exercise: None  Review of Systems      Past Medical History:  Diagnosis Date  . Allergy   . Anxiety   . Arthritis   . Blood in stool   . Chicken pox   . Depression   . Frequent headaches   . Hyperlipidemia   . Urine incontinence     Current Outpatient Medications  Medication Sig Dispense Refill  . Calcium Carb-Cholecalciferol (CALCIUM 600 + D PO) Take 1 tablet by mouth daily.    . Cholecalciferol (VITAMIN D PO) Take 2,000 tablets by mouth daily.    . cyanocobalamin 1000 MCG tablet Take 1,000 mcg by mouth daily.    . fexofenadine (ALLEGRA) 180 MG tablet Take 180 mg by mouth daily.    . fluticasone (FLONASE) 50 MCG/ACT nasal spray Place 2 sprays into both nostrils daily. 16 g 6  . hydrocortisone (ANUSOL-HC) 25 MG suppository PLACE 1 SUPPOSITORY (25 MG TOTAL)  RECTALLY 2 (TWO) TIMES DAILY. 12 suppository 0  . montelukast (SINGULAIR) 10 MG tablet Take 1 tablet (10 mg total) by mouth at bedtime. 90 tablet 2  . montelukast (SINGULAIR) 10 MG tablet TAKE 1 TABLET(10 MG) BY MOUTH AT BEDTIME 90 tablet 0  . oxybutynin (DITROPAN-XL) 10 MG 24 hr tablet Take 1 tablet (10 mg total) by mouth daily. 90 tablet 2  . oxybutynin (DITROPAN-XL) 10 MG 24 hr tablet TAKE 1 TABLET(10 MG) BY MOUTH DAILY 90 tablet 0  . sertraline (ZOLOFT) 50 MG tablet Take 1 tablet (50 mg total) by mouth daily. 90 tablet 2  . sertraline (ZOLOFT) 50 MG tablet TAKE 1 TABLET(50 MG) BY MOUTH DAILY 90 tablet 0  . simvastatin (ZOCOR) 20 MG tablet Take 1 tablet (20 mg total) by mouth daily at 6 PM. 90 tablet 2  . simvastatin (ZOCOR) 20 MG tablet TAKE 1 TABLET(20 MG) BY MOUTH DAILY AT 6 PM 90 tablet 0   Current Facility-Administered Medications  Medication Dose Route Frequency Provider Last Rate Last Admin  . 0.9 %  sodium chloride infusion  500 mL Intravenous Once Pyrtle, Lajuan Lines, MD        No Known Allergies  Family History  Problem Relation Age of Onset  . Arthritis Mother   . Hyperlipidemia Mother   . Hypertension Mother   . Colon polyps Mother   . Cancer Father  Lung  . Cancer Maternal Uncle        Prostate  . Prostate cancer Maternal Uncle   . Rheum arthritis Maternal Grandmother   . Stroke Maternal Grandmother   . Alcohol abuse Maternal Grandfather   . Breast cancer Cousin   . Colon cancer Neg Hx   . Esophageal cancer Neg Hx   . Pancreatic cancer Neg Hx   . Stomach cancer Neg Hx   . Rectal cancer Neg Hx   . Liver cancer Neg Hx     Social History   Socioeconomic History  . Marital status: Single    Spouse name: Not on file  . Number of children: Not on file  . Years of education: Not on file  . Highest education level: Not on file  Occupational History  . Not on file  Tobacco Use  . Smoking status: Never Smoker  . Smokeless tobacco: Never Used  Vaping Use  .  Vaping Use: Never used  Substance and Sexual Activity  . Alcohol use: Yes    Alcohol/week: 0.0 standard drinks    Comment: occasional  . Drug use: No  . Sexual activity: Yes  Other Topics Concern  . Not on file  Social History Narrative  . Not on file   Social Determinants of Health   Financial Resource Strain: Not on file  Food Insecurity: Not on file  Transportation Needs: Not on file  Physical Activity: Not on file  Stress: Not on file  Social Connections: Not on file  Intimate Partner Violence: Not on file     Constitutional: Pt reports headaches. Denies fever, malaise, fatigue, or abrupt weight changes.  HEENT: Denies eye pain, eye redness, ear pain, ringing in the ears, wax buildup, runny nose, nasal congestion, bloody nose, or sore throat. Respiratory: Denies difficulty breathing, shortness of breath, cough or sputum production.   Cardiovascular: Denies chest pain, chest tightness, palpitations or swelling in the hands or feet.  Gastrointestinal: Pt reports reflux. Denies abdominal pain, bloating, constipation, diarrhea or blood in the stool.  GU: Pt reports stress incontinence. Denies urgency, frequency, pain with urination, burning sensation, blood in urine, odor or discharge. Musculoskeletal: Pt reports intermittent back pain. Denies decrease in range of motion, difficulty with gait, muscle pain or joint swelling.  Skin: Denies redness, rashes, lesions or ulcercations.  Neurological: Denies dizziness, difficulty with memory, difficulty with speech or problems with balance and coordination.  Psych: Pt has a history of anxiety and depression. Denies SI/HI.  No other specific complaints in a complete review of systems (except as listed in HPI above).  Objective:   Physical Exam   BP 122/80   Pulse 62   Temp 97.8 F (36.6 C) (Temporal)   Ht 5' 3.5" (1.613 m)   Wt 160 lb (72.6 kg)   LMP  (LMP Unknown)   SpO2 97%   BMI 27.90 kg/m   Wt Readings from Last 3  Encounters:  11/03/18 170 lb (77.1 kg)  10/20/18 174 lb (78.9 kg)  09/15/18 174 lb (78.9 kg)    General: Appears her stated age, well developed, well nourished in NAD. Skin: Warm, dry and intact. No rashes  noted. HEENT: Head: normal shape and size; Eyes: sclera white, no icterus, conjunctiva pink, PERRLA and EOMs intact;  Neck:  Neck supple, trachea midline. No masses, lumps or thyromegaly present.  Cardiovascular: Normal rate and rhythm. S1,S2 noted.  No murmur, rubs or gallops noted. No JVD or BLE edema. No carotid bruits noted. Pulmonary/Chest:  Normal effort and positive vesicular breath sounds. No respiratory distress. No wheezes, rales or ronchi noted.  Abdomen: Soft and nontender. Normal bowel sounds. No distention or masses noted. Liver, spleen and kidneys non palpable. Musculoskeletal: Strength 5/5 BUE/BLE. No difficulty with gait.  Neurological: Alert and oriented. Cranial nerves II-XII grossly intact. Coordination normal.  Psychiatric: Mood and affect normal. Behavior is normal. Judgment and thought content normal.     BMET    Component Value Date/Time   NA 139 11/03/2018 1534   K 3.8 11/03/2018 1534   CL 105 11/03/2018 1534   CO2 25 11/03/2018 1534   GLUCOSE 84 11/03/2018 1534   BUN 16 11/03/2018 1534   CREATININE 0.78 11/03/2018 1534   CREATININE 0.87 04/03/2016 1042   CALCIUM 9.0 11/03/2018 1534    Lipid Panel     Component Value Date/Time   CHOL 165 11/03/2018 1534   TRIG 56.0 11/03/2018 1534   HDL 60.90 11/03/2018 1534   CHOLHDL 3 11/03/2018 1534   VLDL 11.2 11/03/2018 1534   LDLCALC 93 11/03/2018 1534    CBC    Component Value Date/Time   WBC 6.3 11/03/2018 1534   RBC 4.47 11/03/2018 1534   HGB 13.5 11/03/2018 1534   HCT 40.5 11/03/2018 1534   PLT 280.0 11/03/2018 1534   MCV 90.5 11/03/2018 1534   MCH 29.6 04/03/2016 1042   MCHC 33.4 11/03/2018 1534   RDW 12.6 11/03/2018 1534   LYMPHSABS 1,800 04/03/2016 1042   MONOABS 480 04/03/2016 1042    EOSABS 360 04/03/2016 1042   BASOSABS 0 04/03/2016 1042    Hgb A1C No results found for: HGBA1C         Assessment & Plan:   Preventative Health Maintenance:  Flu shot UTD Tetanus UTD COVID UTD Pap smear UTD Mammogram due-she will call to schedule Bone density due-ordered, she will call to schedule Colon screening UTD Encouraged her to consume a balanced diet and exercise regimen Advised her to see an eye doctor and dentist annually We will check CBC, c-Met, lipid profile and Vit D today  RTC in 1 year, sooner if needed Webb Silversmith, NP This visit occurred during the SARS-CoV-2 public health emergency.  Safety protocols were in place, including screening questions prior to the visit, additional usage of staff PPE, and extensive cleaning of exam room while observing appropriate contact time as indicated for disinfecting solutions.

## 2020-09-27 NOTE — Assessment & Plan Note (Signed)
Stable on current dose of Sertraline Support offered Will monitor

## 2020-09-27 NOTE — Assessment & Plan Note (Signed)
Continue Tylenol OTC as needed Encouraged daily stretching and core strengthening

## 2020-09-27 NOTE — Patient Instructions (Signed)

## 2020-09-28 LAB — LIPID PANEL
Cholesterol: 217 mg/dL — ABNORMAL HIGH (ref 0–200)
HDL: 69.1 mg/dL (ref >39.00)
LDL Cholesterol: 133 mg/dL — ABNORMAL HIGH (ref 0–99)
NonHDL: 148.1
Total CHOL/HDL Ratio: 3
Triglycerides: 76 mg/dL (ref 0.0–149.0)
VLDL: 15.2 mg/dL (ref 0.0–40.0)

## 2020-09-28 LAB — COMPREHENSIVE METABOLIC PANEL
ALT: 17 U/L (ref 0–35)
AST: 23 U/L (ref 0–37)
Albumin: 4.7 g/dL (ref 3.5–5.2)
Alkaline Phosphatase: 46 U/L (ref 39–117)
BUN: 20 mg/dL (ref 6–23)
CO2: 26 mEq/L (ref 19–32)
Calcium: 9.3 mg/dL (ref 8.4–10.5)
Chloride: 103 mEq/L (ref 96–112)
Creatinine, Ser: 0.82 mg/dL (ref 0.40–1.20)
GFR: 79.27 mL/min (ref >60.00)
Glucose, Bld: 82 mg/dL (ref 70–99)
Potassium: 4 mEq/L (ref 3.5–5.1)
Sodium: 137 mEq/L (ref 135–145)
Total Bilirubin: 0.5 mg/dL (ref 0.2–1.2)
Total Protein: 7.3 g/dL (ref 6.0–8.3)

## 2020-09-28 LAB — VITAMIN D 25 HYDROXY (VIT D DEFICIENCY, FRACTURES): VITD: 53.53 ng/mL (ref 30.00–100.00)

## 2020-09-28 LAB — CBC
HCT: 40.6 % (ref 36.0–46.0)
Hemoglobin: 13.7 g/dL (ref 12.0–15.0)
MCHC: 33.6 g/dL (ref 30.0–36.0)
MCV: 91.6 fl (ref 78.0–100.0)
Platelets: 296 10*3/uL (ref 150.0–400.0)
RBC: 4.43 Mil/uL (ref 3.87–5.11)
RDW: 12.7 % (ref 11.5–15.5)
WBC: 5.9 10*3/uL (ref 4.0–10.5)

## 2020-11-14 ENCOUNTER — Other Ambulatory Visit: Payer: Self-pay | Admitting: Internal Medicine

## 2020-11-14 DIAGNOSIS — Z1231 Encounter for screening mammogram for malignant neoplasm of breast: Secondary | ICD-10-CM

## 2020-12-06 ENCOUNTER — Ambulatory Visit
Admission: RE | Admit: 2020-12-06 | Discharge: 2020-12-06 | Disposition: A | Discharge status: Home / Self Care | Payer: BC Managed Care – PPO | Source: Ambulatory Visit | Attending: Internal Medicine | Admitting: Internal Medicine

## 2020-12-06 ENCOUNTER — Other Ambulatory Visit: Payer: Self-pay

## 2020-12-06 DIAGNOSIS — M85852 Other specified disorders of bone density and structure, left thigh: Secondary | ICD-10-CM | POA: Diagnosis not present

## 2020-12-06 DIAGNOSIS — Z1231 Encounter for screening mammogram for malignant neoplasm of breast: Secondary | ICD-10-CM | POA: Insufficient documentation

## 2020-12-06 DIAGNOSIS — Z780 Asymptomatic menopausal state: Secondary | ICD-10-CM | POA: Insufficient documentation

## 2020-12-07 ENCOUNTER — Other Ambulatory Visit: Payer: Self-pay | Admitting: Internal Medicine

## 2021-01-18 ENCOUNTER — Other Ambulatory Visit: Payer: Self-pay | Admitting: Internal Medicine

## 2021-04-14 ENCOUNTER — Other Ambulatory Visit: Payer: Self-pay | Admitting: Internal Medicine

## 2021-06-28 ENCOUNTER — Ambulatory Visit: Payer: Self-pay

## 2021-06-28 NOTE — Telephone Encounter (Signed)
Pt called in stating she tested positive for covid and wanted someone to reach out. Please advise.   Pt. Tested positive for COVID 19 last night. Symptoms started yesterday. Cough, fever, headache, chills, sore throat. Taking Tylenol for fever. OTC not helping cough. No availability for virtual visit. Requests medication for cough be sent to pharmacy. Please advise pt. Answer Assessment - Initial Assessment Questions 1. COVID-19 DIAGNOSIS: "Who made your COVID-19 diagnosis?" "Was it confirmed by a positive lab test or self-test?" If not diagnosed by a doctor (or NP/PA), ask "Are there lots of cases (community spread) where you live?" Note: See public health department website, if unsure.     Home test 2. COVID-19 EXPOSURE: "Was there any known exposure to COVID before the symptoms began?" CDC Definition of close contact: within 6 feet (2 meters) for a total of 15 minutes or more over a 24-hour period.      No 3. ONSET: "When did the COVID-19 symptoms start?"      Yesterday 4. WORST SYMPTOM: "What is your worst symptom?" (e.g., cough, fever, shortness of breath, muscle aches)     Cough, sore throat 5. COUGH: "Do you have a cough?" If Yes, ask: "How bad is the cough?"       Yes 6. FEVER: "Do you have a fever?" If Yes, ask: "What is your temperature, how was it measured, and when did it start?"     Yes  100.4 7. RESPIRATORY STATUS: "Describe your breathing?" (e.g., shortness of breath, wheezing, unable to speak)      No 8. BETTER-SAME-WORSE: "Are you getting better, staying the same or getting worse compared to yesterday?"  If getting worse, ask, "In what way?"     Same 9. HIGH RISK DISEASE: "Do you have any chronic medical problems?" (e.g., asthma, heart or lung disease, weak immune system, obesity, etc.)     No 10. VACCINE: "Have you had the COVID-19 vaccine?" If Yes, ask: "Which one, how many shots, when did you get it?"       Yes 11. BOOSTER: "Have you received your COVID-19 booster?" If  Yes, ask: "Which one and when did you get it?"       Yes  x 1 12. PREGNANCY: "Is there any chance you are pregnant?" "When was your last menstrual period?"       No 13. OTHER SYMPTOMS: "Do you have any other symptoms?"  (e.g., chills, fatigue, headache, loss of smell or taste, muscle pain, sore throat)       Headache, cough, chills, sore throat 14. O2 SATURATION MONITOR:  "Do you use an oxygen saturation monitor (pulse oximeter) at home?" If Yes, ask "What is your reading (oxygen level) today?" "What is your usual oxygen saturation reading?" (e.g., 95%)       No  Protocols used: Coronavirus (COVID-19) Diagnosed or Suspected-A-AH

## 2021-07-13 ENCOUNTER — Other Ambulatory Visit: Payer: Self-pay | Admitting: Family

## 2021-09-27 ENCOUNTER — Ambulatory Visit: Payer: Self-pay | Admitting: Podiatry

## 2021-10-09 ENCOUNTER — Ambulatory Visit (INDEPENDENT_AMBULATORY_CARE_PROVIDER_SITE_OTHER): Payer: BC Managed Care – PPO | Admitting: Podiatry

## 2021-10-09 ENCOUNTER — Encounter: Payer: Self-pay | Admitting: Podiatry

## 2021-10-09 ENCOUNTER — Other Ambulatory Visit: Payer: Self-pay

## 2021-10-09 DIAGNOSIS — B070 Plantar wart: Secondary | ICD-10-CM

## 2021-10-09 NOTE — Progress Notes (Signed)
°  Subjective:  Patient ID: Pamela Francis, female    DOB: 1961/01/08,  MRN: [id number]  Chief Complaint  Patient presents with   Callouses    Patient presents today for painful callus lesion bottom of left foot x months    61 y.o. female presents with the above complaint. History confirmed with patient.    Objective:  Physical Exam: warm, good capillary refill, no trophic changes or ulcerative lesions, normal DP and PT pulses, and normal sensory exam. Left Foot: Painful hyperkeratotic lesion with darkened area and central portion left subfirst metatarsal  Assessment:   1. Verruca plantaris      Plan:  Patient was evaluated and treated and all questions answered.  Lesion appears to be not simply a porokeratosis but rather a deep involuting verruca plantaris.  I debrided the overlying skin.  Applied salicylic acid and Cantharone today.  Advised to wash off in 12 hours.  We will see her back in about 1 month for retreatment if necessary.  Return in about 1 month (around 11/09/2021), or if symptoms worsen or fail to improve, for to check on wart.

## 2021-11-20 ENCOUNTER — Encounter: Payer: Self-pay | Admitting: Podiatry

## 2021-11-20 ENCOUNTER — Ambulatory Visit (INDEPENDENT_AMBULATORY_CARE_PROVIDER_SITE_OTHER): Payer: BC Managed Care – PPO | Admitting: Podiatry

## 2021-11-20 ENCOUNTER — Other Ambulatory Visit: Payer: Self-pay

## 2021-11-20 DIAGNOSIS — M25872 Other specified joint disorders, left ankle and foot: Secondary | ICD-10-CM

## 2021-11-20 DIAGNOSIS — B070 Plantar wart: Secondary | ICD-10-CM | POA: Diagnosis not present

## 2021-11-20 NOTE — Progress Notes (Signed)
?  Subjective:  ?Patient ID: Pamela Francis, female    DOB: [birth year],  MRN: [id number] ? ?Chief Complaint  ?Patient presents with  ? Plantar Warts  ?  "Its better but still feels like there is something in it"  ? ? ?61 y.o. female presents with the above complaint. History confirmed with patient.   ? ?Objective:  ?Physical Exam: ?warm, good capillary refill, no trophic changes or ulcerative lesions, normal DP and PT pulses, and normal sensory exam. ?Left Foot: Skin has resolved she has pain on palpation to the fibular sesamoid skin is improved quite a bit.   ? ?Assessment:  ? ?1. Sesamoiditis of left foot   ?2. Verruca plantaris   ? ? ? ?Plan:  ?Patient was evaluated and treated and all questions answered. ? ?She does have deep bony pain to the fibular sesamoid now.  I discussed offloading this with a dancers pad, she has prefabricated insoles and added these to an new [employer] running shoes.  Discussed with her if this is helpful then custom molded orthoses with an unload under the first metatarsal may be beneficial.  She will return as needed for this.  If not improving then I would recommend immobilization for 1 month in a boot she will let her know how she is doing in about a month. ? ?Return if symptoms worsen or fail to improve.  ? ?

## 2021-12-25 ENCOUNTER — Ambulatory Visit (INDEPENDENT_AMBULATORY_CARE_PROVIDER_SITE_OTHER): Payer: BC Managed Care – PPO | Admitting: Nurse Practitioner

## 2021-12-25 ENCOUNTER — Encounter: Payer: Self-pay | Admitting: Nurse Practitioner

## 2021-12-25 VITALS — BP 114/72 | HR 66 | Temp 98.3°F | Ht 63.39 in | Wt 164.5 lb

## 2021-12-25 DIAGNOSIS — N393 Stress incontinence (female) (male): Secondary | ICD-10-CM

## 2021-12-25 DIAGNOSIS — F419 Anxiety disorder, unspecified: Secondary | ICD-10-CM | POA: Diagnosis not present

## 2021-12-25 DIAGNOSIS — G8929 Other chronic pain: Secondary | ICD-10-CM

## 2021-12-25 DIAGNOSIS — M5442 Lumbago with sciatica, left side: Secondary | ICD-10-CM

## 2021-12-25 DIAGNOSIS — E782 Mixed hyperlipidemia: Secondary | ICD-10-CM | POA: Diagnosis not present

## 2021-12-25 DIAGNOSIS — M5441 Lumbago with sciatica, right side: Secondary | ICD-10-CM

## 2021-12-25 DIAGNOSIS — Z6828 Body mass index (BMI) 28.0-28.9, adult: Secondary | ICD-10-CM

## 2021-12-25 DIAGNOSIS — Z1231 Encounter for screening mammogram for malignant neoplasm of breast: Secondary | ICD-10-CM

## 2021-12-25 DIAGNOSIS — F32A Depression, unspecified: Secondary | ICD-10-CM

## 2021-12-25 DIAGNOSIS — Z7689 Persons encountering health services in other specified circumstances: Secondary | ICD-10-CM

## 2021-12-25 MED ORDER — OXYBUTYNIN CHLORIDE ER 15 MG PO TB24: ORAL_TABLET | ORAL | 2 refills | Status: DC

## 2021-12-25 MED ORDER — MELOXICAM 15 MG PO TABS: 15.0000 mg | ORAL_TABLET | Freq: Every day | ORAL | 2 refills | Status: DC

## 2021-12-25 MED ORDER — SERTRALINE HCL 50 MG PO TABS: ORAL_TABLET | ORAL | 1 refills | Status: DC

## 2021-12-25 MED ORDER — SIMVASTATIN 20 MG PO TABS
ORAL_TABLET | ORAL | 1 refills | Status: DC
Start: 2021-12-25 — End: 2022-07-03

## 2021-12-25 NOTE — Progress Notes (Signed)
? ?New Patient Office Visit ? ?Subjective:  ?Patient ID: Pamela Francis, female    DOB: 1961-01-31  Age: 61 y.o. MRN: [id number] ? ?CC:  ?Chief Complaint  ?Patient presents with  ? New Patient (Initial Visit)  ? ? ?HPI ?[name] presents to establish new primary care provider. Recently moved from [place], [place].  ?-low back pain. Most severe in the morning. Hurts also she is on her feet for long periods of time. Does frequently take tylenol to help relieve the pain. This helps for a short period of time.  ?-frequent headaches mostly frontal in nature. Gets them nearly every day. Has been going on for a long time.  Did have a trial of Topamax to prevent headaches.  States she did not like how this made her feel. ?-Overactive bladder.  Currently on Ditropan XL 10 mg daily.  Does not seem to be working. ? ?Past Medical History:  ?Diagnosis Date  ? Allergy   ? Anxiety   ? Arthritis   ? Blood in stool   ? Chicken pox   ? Depression   ? Frequent headaches   ? Hyperlipidemia   ? Urine incontinence   ? ? ?Past Surgical History:  ?Procedure Laterality Date  ? BREAST BIOPSY Left June 2006  ? neg  ? BREAST EXCISIONAL BIOPSY    ? COLONOSCOPY    ? Dr. Berniece Andreas, [place] 2013  ? TONSILLECTOMY AND ADENOIDECTOMY    ? WRIST SURGERY Left   ? ? ?Family History  ?Problem Relation Age of Onset  ? Arthritis Mother   ? Hyperlipidemia Mother   ? Hypertension Mother   ? Colon polyps Mother   ? Cancer Father   ?     Lung  ? Cancer Maternal Uncle   ?     Prostate  ? Prostate cancer Maternal Uncle   ? Rheum arthritis Maternal Grandmother   ? Stroke Maternal Grandmother   ? Alcohol abuse Maternal Grandfather   ? Breast cancer Cousin   ? Colon cancer Neg Hx   ? Esophageal cancer Neg Hx   ? Pancreatic cancer Neg Hx   ? Stomach cancer Neg Hx   ? Rectal cancer Neg Hx   ? Liver cancer Neg Hx   ? ? ?Social History  ? ?Socioeconomic History  ? Marital status: Married  ?  Spouse name: Not on file  ? Number of children: Not on file  ? Years of  education: Not on file  ? Highest education level: Not on file  ?Occupational History  ? Not on file  ?Tobacco Use  ? Smoking status: Never  ? Smokeless tobacco: Never  ?Vaping Use  ? Vaping Use: Never used  ?Substance and Sexual Activity  ? Alcohol use: Yes  ?  Alcohol/week: 0.0 standard drinks  ?  Comment: occasional  ? Drug use: No  ? Sexual activity: Yes  ?Other Topics Concern  ? Not on file  ?Social History Narrative  ? Not on file  ? ?Social Determinants of Health  ? ?Financial Resource Strain: Not on file  ?Food Insecurity: Not on file  ?Transportation Needs: Not on file  ?Physical Activity: Not on file  ?Stress: Not on file  ?Social Connections: Not on file  ?Intimate Partner Violence: Not on file  ? ? ?ROS ?Review of Systems  ?Constitutional:  Positive for fatigue. Negative for activity change, appetite change, chills and fever.  ?HENT:  Negative for congestion, postnasal drip, rhinorrhea, sinus pressure, sinus pain, sneezing and sore  throat.   ?Eyes: Negative.   ?Respiratory:  Negative for cough, chest tightness, shortness of breath and wheezing.   ?Cardiovascular:  Negative for chest pain and palpitations.  ?Gastrointestinal:  Negative for abdominal pain, constipation, diarrhea, nausea and vomiting.  ?Endocrine: Negative for cold intolerance, heat intolerance, polydipsia and polyuria.  ?Genitourinary:  Negative for dyspareunia, dysuria, flank pain, frequency and urgency.  ?Musculoskeletal:  Positive for back pain and myalgias. Negative for arthralgias.  ?Skin:  Negative for rash.  ?Allergic/Immunologic: Negative for environmental allergies.  ?Neurological:  Positive for headaches. Negative for dizziness and weakness.  ?Hematological:  Negative for adenopathy.  ?Psychiatric/Behavioral:  The patient is not nervous/anxious.   ? ?Objective:  ? ?Today's Vitals  ? 12/25/21 1538 12/25/21 1545  ?BP: (!) 146/80 114/72  ?Pulse: 66   ?Temp: 98.3 ?F (36.8 ?C)   ?SpO2: 100%   ?Weight: 164 lb 8 oz (74.6 kg)   ?Height:  5' 3.39" (1.61 m)   ? ?Body mass index is 28.79 kg/m?.  ? ?Physical Exam ?Vitals and nursing note reviewed.  ?Constitutional:   ?   Appearance: Normal appearance. She is well-developed.  ?HENT:  ?   Head: Normocephalic and atraumatic.  ?   Mouth/Throat:  ?   Mouth: Mucous membranes are moist.  ?   Pharynx: Oropharynx is clear.  ?Eyes:  ?   Extraocular Movements: Extraocular movements intact.  ?   Conjunctiva/sclera: Conjunctivae normal.  ?   Pupils: Pupils are equal, round, and reactive to light.  ?Cardiovascular:  ?   Rate and Rhythm: Normal rate and regular rhythm.  ?   Pulses: Normal pulses.  ?   Heart sounds: Normal heart sounds.  ?Pulmonary:  ?   Effort: Pulmonary effort is normal.  ?   Breath sounds: Normal breath sounds.  ?Abdominal:  ?   Palpations: Abdomen is soft.  ?Musculoskeletal:  ?   Cervical back: Normal range of motion and neck supple.  ?   Lumbar back: Tenderness and bony tenderness present. Decreased range of motion.  ?Lymphadenopathy:  ?   Cervical: No cervical adenopathy.  ?Skin: ?   General: Skin is warm and dry.  ?   Capillary Refill: Capillary refill takes less than 2 seconds.  ?Neurological:  ?   General: No focal deficit present.  ?   Mental Status: She is alert and oriented to person, place, and time.  ?Psychiatric:     ?   Mood and Affect: Mood normal.     ?   Behavior: Behavior normal.     ?   Thought Content: Thought content normal.     ?   Judgment: Judgment normal.  ? ? ?Assessment & Plan:  ?1. Chronic bilateral low back pain with bilateral sciatica ?Trial meloxicam 15 mg daily to help reduce pain and inflammation.  May take Tylenol as needed and as indicated for continued pain.  We will get x-ray of lumbar spine for further evaluation.  Refer to orthopedics as indicated. ?- DG Lumbar Spine 2-3 Views; Future ?- meloxicam (MOBIC) 15 MG tablet; Take 1 tablet (15 mg total) by mouth daily.  Dispense: 30 tablet; Refill: 2 ? ?2. Stress incontinence ?Increase prescription for oxybutynin XL to  15 mg daily.  Reassess at next visit in 6 weeks. ?- oxybutynin (DITROPAN XL) 15 MG 24 hr tablet; TAKE 1 TABLET(10 MG) BY MOUTH DAILY  Dispense: 30 tablet; Refill: 2 ? ?3. Mixed hyperlipidemia ?Continue simvastatin 20 mg daily.  New prescription sent to pharmacy. ?- simvastatin (ZOCOR)  20 MG tablet; TAKE 1 TABLET(20 MG) BY MOUTH DAILY AT 6 PM  Dispense: 90 tablet; Refill: 1 ? ?4. Anxiety and depression ?Stable.  Continue sertraline 50 mg daily. ?- sertraline (ZOLOFT) 50 MG tablet; TAKE 1 TABLET(50 MG) BY MOUTH DAILY  Dispense: 90 tablet; Refill: 1 ? ?5. Body mass index 28.0-28.9, adult ?Discussed lowering calorie intake to 1500 calories per day and incorporating exercise into daily routine to help lose weight.  ? ?6. Encounter for screening mammogram for malignant neoplasm of breast ?Order for screening mammogram placed today. ?- MM DIGITAL SCREENING BILATERAL; Future ? ?7. Encounter to establish care ?Appointment today to establish new primary care provider.  ? ?  ?Problem List Items Addressed This Visit   ? ?  ? Nervous and Auditory  ? Chronic bilateral low back pain with bilateral sciatica - Primary  ? Relevant Medications  ? sertraline (ZOLOFT) 50 MG tablet  ? meloxicam (MOBIC) 15 MG tablet  ? Other Relevant Orders  ? DG Lumbar Spine 2-3 Views  ?  ? Other  ? HLD (hyperlipidemia)  ? Relevant Medications  ? simvastatin (ZOCOR) 20 MG tablet  ? Anxiety and depression  ? Relevant Medications  ? sertraline (ZOLOFT) 50 MG tablet  ? Stress incontinence  ? Relevant Medications  ? oxybutynin (DITROPAN XL) 15 MG 24 hr tablet  ? Body mass index 28.0-28.9, adult  ? Encounter for screening mammogram for malignant neoplasm of breast  ? Relevant Orders  ? MM DIGITAL SCREENING BILATERAL  ? ?Other Visit Diagnoses   ? ? Encounter to establish care      ? ?  ? ? ?Outpatient Encounter Medications as of 12/25/2021  ?Medication Sig  ? Calcium Carb-Cholecalciferol (CALCIUM 600 + D PO) Take 1 tablet by mouth daily.  ? Cholecalciferol  (VITAMIN D PO) Take 2,000 tablets by mouth daily.  ? cyanocobalamin 1000 MCG tablet Take 1,000 mcg by mouth daily.  ? fexofenadine (ALLEGRA) 180 MG tablet Take 180 mg by mouth daily.  ? fluticasone [facility]

## 2021-12-31 DIAGNOSIS — Z6828 Body mass index (BMI) 28.0-28.9, adult: Secondary | ICD-10-CM | POA: Insufficient documentation

## 2021-12-31 DIAGNOSIS — G8929 Other chronic pain: Secondary | ICD-10-CM | POA: Insufficient documentation

## 2021-12-31 DIAGNOSIS — N393 Stress incontinence (female) (male): Secondary | ICD-10-CM | POA: Insufficient documentation

## 2021-12-31 DIAGNOSIS — Z1231 Encounter for screening mammogram for malignant neoplasm of breast: Secondary | ICD-10-CM | POA: Insufficient documentation

## 2022-01-01 ENCOUNTER — Ambulatory Visit
Admission: RE | Admit: 2022-01-01 | Discharge: 2022-01-01 | Disposition: A | Discharge status: Home / Self Care | Payer: BC Managed Care – PPO | Source: Ambulatory Visit | Attending: Nurse Practitioner | Admitting: Nurse Practitioner

## 2022-01-01 ENCOUNTER — Encounter: Payer: Self-pay | Admitting: Nurse Practitioner

## 2022-01-01 DIAGNOSIS — G8929 Other chronic pain: Secondary | ICD-10-CM

## 2022-01-15 ENCOUNTER — Other Ambulatory Visit: Payer: Self-pay | Admitting: Nurse Practitioner

## 2022-01-15 DIAGNOSIS — Z1231 Encounter for screening mammogram for malignant neoplasm of breast: Secondary | ICD-10-CM

## 2022-01-29 ENCOUNTER — Other Ambulatory Visit: Payer: PRIVATE HEALTH INSURANCE

## 2022-01-29 ENCOUNTER — Other Ambulatory Visit: Payer: Self-pay

## 2022-01-29 DIAGNOSIS — Z0000 Encounter for general adult medical examination without abnormal findings: Secondary | ICD-10-CM

## 2022-01-29 DIAGNOSIS — Z130 Encounter for screening for diseases of the blood and blood-forming organs and certain disorders involving the immune mechanism: Secondary | ICD-10-CM

## 2022-01-29 DIAGNOSIS — E569 Vitamin deficiency, unspecified: Secondary | ICD-10-CM

## 2022-01-31 ENCOUNTER — Other Ambulatory Visit: Payer: PRIVATE HEALTH INSURANCE

## 2022-01-31 DIAGNOSIS — Z0000 Encounter for general adult medical examination without abnormal findings: Secondary | ICD-10-CM

## 2022-01-31 DIAGNOSIS — Z1329 Encounter for screening for other suspected endocrine disorder: Secondary | ICD-10-CM

## 2022-01-31 DIAGNOSIS — E569 Vitamin deficiency, unspecified: Secondary | ICD-10-CM

## 2022-02-01 LAB — COMPREHENSIVE METABOLIC PANEL
ALT: 20 IU/L (ref 0–32)
AST: 24 IU/L (ref 0–40)
Albumin/Globulin Ratio: 2.4 — ABNORMAL HIGH (ref 1.2–2.2)
Albumin: 4.5 g/dL (ref 3.8–4.9)
Alkaline Phosphatase: 47 IU/L (ref 44–121)
BUN/Creatinine Ratio: 16 (ref 9–23)
BUN: 12 mg/dL (ref 6–24)
Bilirubin Total: 0.3 mg/dL (ref 0.0–1.2)
CO2: 23 mmol/L (ref 20–29)
Calcium: 8.9 mg/dL (ref 8.7–10.2)
Chloride: 103 mmol/L (ref 96–106)
Creatinine, Ser: 0.75 mg/dL (ref 0.57–1.00)
Globulin, Total: 1.9 g/dL (ref 1.5–4.5)
Glucose: 97 mg/dL (ref 70–99)
Potassium: 4.3 mmol/L (ref 3.5–5.2)
Sodium: 141 mmol/L (ref 134–144)
Total Protein: 6.4 g/dL (ref 6.0–8.5)
eGFR: 92 mL/min/{1.73_m2} (ref >59)

## 2022-02-01 LAB — HEMOGLOBIN A1C
Est. average glucose Bld gHb Est-mCnc: 117 mg/dL
Hgb A1c MFr Bld: 5.7 % — ABNORMAL HIGH (ref 4.8–5.6)

## 2022-02-01 LAB — CBC WITH DIFFERENTIAL/PLATELET
Basophils Absolute: 0.1 10*3/uL (ref 0.0–0.2)
Basos: 1 %
EOS (ABSOLUTE): 0.3 10*3/uL (ref 0.0–0.4)
Eos: 7 %
Hematocrit: 38.5 % (ref 34.0–46.6)
Hemoglobin: 12.9 g/dL (ref 11.1–15.9)
Immature Grans (Abs): 0 10*3/uL (ref 0.0–0.1)
Immature Granulocytes: 0 %
Lymphocytes Absolute: 1.6 10*3/uL (ref 0.7–3.1)
Lymphs: 35 %
MCH: 31.2 pg (ref 26.6–33.0)
MCHC: 33.5 g/dL (ref 31.5–35.7)
MCV: 93 fL (ref 79–97)
Monocytes Absolute: 0.5 10*3/uL (ref 0.1–0.9)
Monocytes: 10 %
Neutrophils Absolute: 2.2 10*3/uL (ref 1.4–7.0)
Neutrophils: 47 %
Platelets: 253 10*3/uL (ref 150–450)
RBC: 4.14 x10E6/uL (ref 3.77–5.28)
RDW: 12 % (ref 11.7–15.4)
WBC: 4.6 10*3/uL (ref 3.4–10.8)

## 2022-02-01 LAB — LIPID PANEL
Chol/HDL Ratio: 2.9 ratio (ref 0.0–4.4)
Cholesterol, Total: 174 mg/dL (ref 100–199)
HDL: 60 mg/dL (ref >39)
LDL Chol Calc (NIH): 97 mg/dL (ref 0–99)
Triglycerides: 92 mg/dL (ref 0–149)
VLDL Cholesterol Cal: 17 mg/dL (ref 5–40)

## 2022-02-01 LAB — TSH: TSH: 3.8 u[IU]/mL (ref 0.450–4.500)

## 2022-02-01 LAB — VITAMIN D 25 HYDROXY (VIT D DEFICIENCY, FRACTURES): Vit D, 25-Hydroxy: 51.9 ng/mL (ref 30.0–100.0)

## 2022-02-07 ENCOUNTER — Other Ambulatory Visit (HOSPITAL_COMMUNITY)
Admission: RE | Admit: 2022-02-07 | Discharge: 2022-02-07 | Disposition: A | Discharge status: Home / Self Care | Payer: BC Managed Care – PPO | Source: Ambulatory Visit | Attending: Nurse Practitioner | Admitting: Nurse Practitioner

## 2022-02-07 ENCOUNTER — Ambulatory Visit (INDEPENDENT_AMBULATORY_CARE_PROVIDER_SITE_OTHER): Payer: BC Managed Care – PPO | Admitting: Nurse Practitioner

## 2022-02-07 ENCOUNTER — Encounter: Payer: Self-pay | Admitting: Nurse Practitioner

## 2022-02-07 VITALS — BP 133/77 | HR 61 | Temp 98.0°F | Ht 63.39 in | Wt 166.1 lb

## 2022-02-07 DIAGNOSIS — Z01419 Encounter for gynecological examination (general) (routine) without abnormal findings: Secondary | ICD-10-CM | POA: Diagnosis not present

## 2022-02-07 DIAGNOSIS — M5442 Lumbago with sciatica, left side: Secondary | ICD-10-CM | POA: Diagnosis not present

## 2022-02-07 DIAGNOSIS — G8929 Other chronic pain: Secondary | ICD-10-CM

## 2022-02-07 DIAGNOSIS — N393 Stress incontinence (female) (male): Secondary | ICD-10-CM | POA: Diagnosis not present

## 2022-02-07 DIAGNOSIS — M5441 Lumbago with sciatica, right side: Secondary | ICD-10-CM

## 2022-02-07 DIAGNOSIS — Z23 Encounter for immunization: Secondary | ICD-10-CM | POA: Diagnosis not present

## 2022-02-07 DIAGNOSIS — E782 Mixed hyperlipidemia: Secondary | ICD-10-CM | POA: Diagnosis not present

## 2022-02-07 NOTE — Progress Notes (Signed)
Complete physical exam   Patient: Pamela Francis   DOB: [birth year]   61 y.o. Female  MRN: [id number] Visit Date: 02/07/2022    Chief Complaint  Patient presents with   Annual Exam   Gynecologic Exam   Subjective    Pamela Francis is a 61 y.o. female who presents today for a complete physical exam.  She reports consuming a general diet. The patient does not participate in regular exercise at present. She generally feels fairly well. She does have additional problems to discuss today.  HPI  Annual physical  -chronic lower back pain - had x-ray done - showed Grade 1 anterolisthesis of L5 on S1 with multilevel lumbar degenerative disc changes greatest at this level. Did not want referral to orthopedics at time of x-ray as meloxicam was helping with pain. Some days the back pain is worse than others.  -routine fasting labs done prior to today's visit.  --mild elevation of HgbA1c 5.7 --other labs normal   Past Medical History:  Diagnosis Date   Allergy    Anxiety    Arthritis    Blood in stool    Chicken pox    Depression    Frequent headaches    Hyperlipidemia    Urine incontinence    Past Surgical History:  Procedure Laterality Date   BREAST BIOPSY Left June 2006   neg   BREAST EXCISIONAL BIOPSY     COLONOSCOPY     Dr. Berniece Andreas, [place] 2013   TONSILLECTOMY AND ADENOIDECTOMY     WRIST SURGERY Left    Social History   Socioeconomic History   Marital status: Married    Spouse name: Not on file   Number of children: Not on file   Years of education: Not on file   Highest education level: Not on file  Occupational History   Not on file  Tobacco Use   Smoking status: Never   Smokeless tobacco: Never  Vaping Use   Vaping Use: Never used  Substance and Sexual Activity   Alcohol use: Yes    Alcohol/week: 0.0 standard drinks    Comment: occasional   Drug use: No   Sexual activity: Yes  Other Topics Concern   Not on file  Social History Narrative   Not on  file   Social Determinants of Health   Financial Resource Strain: Not on file  Food Insecurity: Not on file  Transportation Needs: Not on file  Physical Activity: Not on file  Stress: Not on file  Social Connections: Not on file  Intimate Partner Violence: Not on file   Family Status  Relation Name Status   Mother  Alive   Father  Deceased   Mat Uncle  Deceased   MGM  (Not Specified)   MGF  (Not Specified)   Cousin  (Not Specified)   Neg Hx  (Not Specified)   Family History  Problem Relation Age of Onset   Arthritis Mother    Hyperlipidemia Mother    Hypertension Mother    Colon polyps Mother    Cancer Father        Lung   Cancer Maternal Uncle        Prostate   Prostate cancer Maternal Uncle    Rheum arthritis Maternal Grandmother    Stroke Maternal Grandmother    Alcohol abuse Maternal Grandfather    Breast cancer Cousin    Colon cancer Neg Hx    Esophageal cancer Neg Hx    Pancreatic cancer Neg  Hx    Stomach cancer Neg Hx    Rectal cancer Neg Hx    Liver cancer Neg Hx    No Known Allergies  Patient Care Team: Ronnell Freshwater, NP as PCP - General (Family Medicine)   Medications: Outpatient Medications Prior to Visit  Medication Sig   Calcium Carb-Cholecalciferol (CALCIUM 600 + D PO) Take 1 tablet by mouth daily.   Cholecalciferol (VITAMIN D PO) Take 2,000 tablets by mouth daily.   cyanocobalamin 1000 MCG tablet Take 1,000 mcg by mouth daily.   fexofenadine (ALLEGRA) 180 MG tablet Take 180 mg by mouth daily.   fluticasone (FLONASE) 50 MCG/ACT nasal spray Place 2 sprays into both nostrils daily.   meloxicam (MOBIC) 15 MG tablet Take 1 tablet (15 mg total) by mouth daily.   oxybutynin (DITROPAN XL) 15 MG 24 hr tablet TAKE 1 TABLET(10 MG) BY MOUTH DAILY   sertraline (ZOLOFT) 50 MG tablet TAKE 1 TABLET(50 MG) BY MOUTH DAILY   simvastatin (ZOCOR) 20 MG tablet TAKE 1 TABLET(20 MG) BY MOUTH DAILY AT 6 PM   [DISCONTINUED] montelukast (SINGULAIR) 10 MG tablet  TAKE 1 TABLET(10 MG) BY MOUTH AT BEDTIME   Facility-Administered Medications Prior to Visit  Medication Dose Route Frequency Provider   0.9 %  sodium chloride infusion  500 mL Intravenous Once Pyrtle, Lajuan Lines, MD    Review of Systems  Constitutional:  Negative for activity change, appetite change, chills, fatigue and fever.  HENT:  Negative for congestion, postnasal drip, rhinorrhea, sinus pressure, sinus pain, sneezing and sore throat.   Eyes: Negative.   Respiratory:  Negative for cough, chest tightness, shortness of breath and wheezing.   Cardiovascular:  Negative for chest pain and palpitations.  Gastrointestinal:  Negative for abdominal pain, constipation, diarrhea, nausea and vomiting.  Endocrine: Negative for cold intolerance, heat intolerance, polydipsia and polyuria.  Genitourinary:  Negative for dyspareunia, dysuria, flank pain, frequency and urgency.  Musculoskeletal:  Positive for back pain. Negative for arthralgias and myalgias.  Skin:  Negative for rash.  Allergic/Immunologic: Negative for environmental allergies.  Neurological:  Negative for dizziness, weakness and headaches.  Hematological:  Negative for adenopathy.  Psychiatric/Behavioral:  The patient is not nervous/anxious.    Last CBC Lab Results  Component Value Date   WBC 4.6 01/31/2022   HGB 12.9 01/31/2022   HCT 38.5 01/31/2022   MCV 93 01/31/2022   MCH 31.2 01/31/2022   RDW 12.0 01/31/2022   PLT 253 09/73/5329   Last metabolic panel Lab Results  Component Value Date   GLUCOSE 97 01/31/2022   NA 141 01/31/2022   K 4.3 01/31/2022   CL 103 01/31/2022   CO2 23 01/31/2022   BUN 12 01/31/2022   CREATININE 0.75 01/31/2022   EGFR 92 01/31/2022   CALCIUM 8.9 01/31/2022   PROT 6.4 01/31/2022   ALBUMIN 4.5 01/31/2022   LABGLOB 1.9 01/31/2022   AGRATIO 2.4 (H) 01/31/2022   BILITOT 0.3 01/31/2022   ALKPHOS 47 01/31/2022   AST 24 01/31/2022   ALT 20 01/31/2022   Last lipids Lab Results  Component  Value Date   CHOL 174 01/31/2022   HDL 60 01/31/2022   LDLCALC 97 01/31/2022   TRIG 92 01/31/2022   CHOLHDL 2.9 01/31/2022   Last hemoglobin A1c Lab Results  Component Value Date   HGBA1C 5.7 (H) 01/31/2022   Last thyroid functions Lab Results  Component Value Date   TSH 3.800 01/31/2022   Last vitamin D Lab Results  Component Value Date  VD25OH 51.9 01/31/2022       Objective     Today's Vitals   02/07/22 1607  BP: 133/77  Pulse: 61  Temp: 98 F (36.7 C)  SpO2: 99%  Weight: 166 lb 1.9 oz (75.4 kg)  Height: 5' 3.39" (1.61 m)   Body mass index is 29.07 kg/m.   BP Readings from Last 3 Encounters:  02/07/22 133/77  12/25/21 114/72  09/27/20 122/80    Wt Readings from Last 3 Encounters:  02/07/22 166 lb 1.9 oz (75.4 kg)  12/25/21 164 lb 8 oz (74.6 kg)  09/27/20 160 lb (72.6 kg)     Physical Exam Vitals and nursing note reviewed. Exam conducted with a chaperone present.  Constitutional:      Appearance: Normal appearance. She is well-developed.  HENT:     Head: Normocephalic and atraumatic.     Right Ear: Tympanic membrane, ear canal and external ear normal.     Left Ear: Tympanic membrane, ear canal and external ear normal.     Nose: Nose normal.     Mouth/Throat:     Mouth: Mucous membranes are moist.     Pharynx: Oropharynx is clear.  Eyes:     Extraocular Movements: Extraocular movements intact.     Conjunctiva/sclera: Conjunctivae normal.     Pupils: Pupils are equal, round, and reactive to light.  Neck:     Vascular: No carotid bruit.  Cardiovascular:     Rate and Rhythm: Normal rate and regular rhythm.     Pulses: Normal pulses.     Heart sounds: Normal heart sounds.  Pulmonary:     Effort: Pulmonary effort is normal.     Breath sounds: Normal breath sounds.  Chest:  Breasts:    Right: Normal. No swelling, bleeding, inverted nipple, mass, nipple discharge, skin change or tenderness.     Left: Normal. No swelling, bleeding, inverted  nipple, mass, nipple discharge, skin change or tenderness.  Abdominal:     General: Bowel sounds are normal. There is no distension.     Palpations: Abdomen is soft. There is no mass.     Tenderness: There is no abdominal tenderness. There is no right CVA tenderness, left CVA tenderness, guarding or rebound.     Hernia: No hernia is present.  Genitourinary:    General: Normal vulva.     Exam position: Supine.     Labia:        Right: No rash, tenderness, lesion or injury.        Left: No rash, tenderness, lesion or injury.      Vagina: Normal. No signs of injury and foreign body. No vaginal discharge, erythema, tenderness, bleeding, lesions or prolapsed vaginal walls.     Cervix: No cervical motion tenderness, discharge, friability, lesion, erythema, cervical bleeding or eversion.     Uterus: Not deviated, not enlarged, not fixed, not tender and no uterine prolapse.      Adnexa: Right adnexa normal and left adnexa normal.     Comments: No tenderness, masses, or organomeglay present during bimanual exam .   Musculoskeletal:        General: Normal range of motion.     Cervical back: Normal range of motion and neck supple.  Lymphadenopathy:     Cervical: No cervical adenopathy.     Upper Body:     Right upper body: No axillary adenopathy.     Left upper body: No axillary adenopathy.  Skin:    General: Skin is warm and  dry.     Capillary Refill: Capillary refill takes less than 2 seconds.  Neurological:     General: No focal deficit present.     Mental Status: She is alert and oriented to person, place, and time.  Psychiatric:        Mood and Affect: Mood normal.        Behavior: Behavior normal.        Thought Content: Thought content normal.        Judgment: Judgment normal.     Last depression screening scores    02/07/2022    4:11 PM 12/25/2021    3:42 PM 09/27/2020    2:33 PM  PHQ 2/9 Scores  PHQ - 2 Score 0 0 2  PHQ- 9 Score _0 Last fall risk screening     12/25/2021    3:42 PM  Fall Risk   Falls in the past year? 0  Number falls in past yr: 0  Injury with Fall? 0  Follow up Falls evaluation completed     Assessment & Plan    1. Well woman exam Annual physical today with Pap smear obtained during today's visit. - Cytology - PAP( [facility])  2. Chronic bilateral low back pain with bilateral sciatica Reviewed x-ray of lumbar spine.  This did show Grade 1 anterolisthesis of L5 on S1 with multilevel lumbar degenerative disc changes greatest at this level meloxicam does help with pain and inflammation.  Would like to hold off on orthopedic referral at this time.  We will reassess at next visit.  3. Mixed hyperlipidemia Fasting lipid panel normal.  She should continue simvastatin as prescribed.  4. Stress incontinence May continue use Ditropan as previously prescribed.  5. Need for shingles vaccine Initial shingles vaccine administered during today's visit. - Varicella-zoster vaccine IM     Immunization History  Administered Date(s) Administered   Influenza,inj,Quad PF,6+ Mos 06/18/2018   Influenza-Unspecified 07/07/2020   Moderna Sars-Covid-2 Vaccination 09/28/2019, 10/26/2019, 07/29/2020   Td 11/03/2018   Tdap 01/16/2008   Zoster Recombinat (Shingrix) 02/07/2022    Health Maintenance  Topic Date Due   COVID-19 Vaccine (4 - Booster for Moderna series) 09/23/2020   PAP SMEAR-Modifier  02/14/2022 (Originally 09/15/2021)   Zoster Vaccines- Shingrix (2 of 2) 04/04/2022   INFLUENZA VACCINE  04/17/2022   MAMMOGRAM  12/07/2022   COLONOSCOPY (Pts 45-41yr Insurance coverage will need to be confirmed)  12/05/2027   TETANUS/TDAP  11/03/2028   Hepatitis C Screening  Completed   HIV Screening  Completed   HPV VACCINES  Aged Out    Discussed health benefits of physical activity, and encouraged her to engage in regular exercise appropriate for her age and condition.  Problem List Items Addressed This Visit       Nervous and  Auditory   Chronic bilateral low back pain with bilateral sciatica     Other   Mixed hyperlipidemia   Stress incontinence   Other Visit Diagnoses     Well woman exam    -  Primary   Relevant Orders   Cytology - PAP( [place])   Need for shingles vaccine       Relevant Orders   Varicella-zoster vaccine IM (Completed)        Return in about 3 months (around 05/10/2022) for mood, back pain - 2nd shingles vaccine .        HRonnell Freshwater NP  [phone](phone)  [phone] (fax)  [place]

## 2022-02-07 NOTE — Progress Notes (Signed)
Labs look good. Review at visit 02/07/2022

## 2022-02-13 ENCOUNTER — Ambulatory Visit
Admission: RE | Admit: 2022-02-13 | Discharge: 2022-02-13 | Disposition: A | Discharge status: Home / Self Care | Payer: BC Managed Care – PPO | Source: Ambulatory Visit | Attending: Nurse Practitioner | Admitting: Nurse Practitioner

## 2022-02-13 DIAGNOSIS — Z1231 Encounter for screening mammogram for malignant neoplasm of breast: Secondary | ICD-10-CM | POA: Insufficient documentation

## 2022-02-13 LAB — CYTOLOGY - PAP
Adequacy: ABSENT
Comment: NEGATIVE
High risk HPV: POSITIVE — AB

## 2022-02-13 NOTE — Progress Notes (Signed)
Negative mammogram

## 2022-02-14 ENCOUNTER — Other Ambulatory Visit: Payer: Self-pay | Admitting: Nurse Practitioner

## 2022-02-14 DIAGNOSIS — R87629 Unspecified abnormal cytological findings in specimens from vagina: Secondary | ICD-10-CM

## 2022-02-14 NOTE — Progress Notes (Signed)
Please let the patient know that pap smear did come back with some abnormal cells. I would like to send her to GYN for further evaluation. Can you ask if she has a GYN provider she would like to see?  Thanks so much.   -HB

## 2022-03-22 ENCOUNTER — Other Ambulatory Visit: Payer: Self-pay | Admitting: Nurse Practitioner

## 2022-03-22 DIAGNOSIS — N393 Stress incontinence (female) (male): Secondary | ICD-10-CM

## 2022-03-22 DIAGNOSIS — G8929 Other chronic pain: Secondary | ICD-10-CM

## 2022-05-06 NOTE — Progress Notes (Unsigned)
Established patient visit   Patient: Pamela Francis   DOB: 1961/04/23   61 y.o. Female  MRN: [id number] Visit Date: 05/07/2022   No chief complaint on file.  Subjective    HPI  Follow up visit  -mood. Currently on sertraline 50 mg daily.  -has been well controlled.  -taking meloxicam as needed for back pain and inflammation. Doing well.  -due to have 2nd shingles vaccine.  -mammogram done 02/13/2022 was negative  -referred to  GYN due to abnormal cells present on pap smear   Medications: Outpatient Medications Prior to Visit  Medication Sig   Calcium Carb-Cholecalciferol (CALCIUM 600 + D PO) Take 1 tablet by mouth daily.   Cholecalciferol (VITAMIN D PO) Take 2,000 tablets by mouth daily.   cyanocobalamin 1000 MCG tablet Take 1,000 mcg by mouth daily.   fexofenadine (ALLEGRA) 180 MG tablet Take 180 mg by mouth daily.   fluticasone (FLONASE) 50 MCG/ACT nasal spray Place 2 sprays into both nostrils daily.   meloxicam (MOBIC) 15 MG tablet TAKE 1 TABLET(15 MG) BY MOUTH DAILY   oxybutynin (DITROPAN XL) 15 MG 24 hr tablet TAKE 1 TABLET BY MOUTH EVERY DAY   sertraline (ZOLOFT) 50 MG tablet TAKE 1 TABLET(50 MG) BY MOUTH DAILY   simvastatin (ZOCOR) 20 MG tablet TAKE 1 TABLET(20 MG) BY MOUTH DAILY AT 6 PM   Facility-Administered Medications Prior to Visit  Medication Dose Route Frequency Provider   0.9 %  sodium chloride infusion  500 mL Intravenous Once Pyrtle, Lajuan Lines, MD    Review of Systems  {Labs (Optional):23779}   Objective    LMP  (LMP Unknown)  BP Readings from Last 3 Encounters:  02/07/22 133/77  12/25/21 114/72  09/27/20 122/80    Wt Readings from Last 3 Encounters:  02/07/22 166 lb 1.9 oz (75.4 kg)  12/25/21 164 lb 8 oz (74.6 kg)  09/27/20 160 lb (72.6 kg)    Physical Exam  ***  No results found for any visits on 05/07/22.  Assessment & Plan     Problem List Items Addressed This Visit   None    No follow-ups on file.         Ronnell Freshwater, NP   [facility] at [facility] [phone] (phone) [phone] (fax)  [place]

## 2022-05-07 ENCOUNTER — Encounter: Payer: Self-pay | Admitting: Nurse Practitioner

## 2022-05-07 ENCOUNTER — Ambulatory Visit (INDEPENDENT_AMBULATORY_CARE_PROVIDER_SITE_OTHER): Payer: BC Managed Care – PPO | Admitting: Nurse Practitioner

## 2022-05-07 VITALS — BP 129/78 | HR 55 | Ht 63.39 in | Wt 163.1 lb

## 2022-05-07 DIAGNOSIS — F32A Depression, unspecified: Secondary | ICD-10-CM

## 2022-05-07 DIAGNOSIS — F419 Anxiety disorder, unspecified: Secondary | ICD-10-CM | POA: Diagnosis not present

## 2022-05-07 DIAGNOSIS — J029 Acute pharyngitis, unspecified: Secondary | ICD-10-CM | POA: Diagnosis not present

## 2022-05-07 DIAGNOSIS — R87612 Low grade squamous intraepithelial lesion on cytologic smear of cervix (LGSIL): Secondary | ICD-10-CM | POA: Diagnosis not present

## 2022-05-07 MED ORDER — SERTRALINE HCL 50 MG PO TABS: ORAL_TABLET | ORAL | 1 refills | Status: DC

## 2022-05-08 ENCOUNTER — Encounter (HOSPITAL_BASED_OUTPATIENT_CLINIC_OR_DEPARTMENT_OTHER): Payer: Self-pay | Admitting: Obstetrics & Gynecology

## 2022-05-08 ENCOUNTER — Other Ambulatory Visit (HOSPITAL_COMMUNITY)
Admission: RE | Admit: 2022-05-08 | Discharge: 2022-05-08 | Disposition: A | Discharge status: Home / Self Care | Payer: BC Managed Care – PPO | Source: Ambulatory Visit | Attending: Obstetrics & Gynecology | Admitting: Obstetrics & Gynecology

## 2022-05-08 ENCOUNTER — Ambulatory Visit (INDEPENDENT_AMBULATORY_CARE_PROVIDER_SITE_OTHER): Payer: BC Managed Care – PPO | Admitting: Obstetrics & Gynecology

## 2022-05-08 VITALS — BP 144/76 | HR 59 | Ht 63.5 in | Wt 162.6 lb

## 2022-05-08 DIAGNOSIS — B977 Papillomavirus as the cause of diseases classified elsewhere: Secondary | ICD-10-CM

## 2022-05-08 DIAGNOSIS — R87612 Low grade squamous intraepithelial lesion on cytologic smear of cervix (LGSIL): Secondary | ICD-10-CM | POA: Diagnosis not present

## 2022-05-08 DIAGNOSIS — R87623 High grade squamous intraepithelial lesion on cytologic smear of vagina (HGSIL): Secondary | ICD-10-CM

## 2022-05-08 DIAGNOSIS — N952 Postmenopausal atrophic vaginitis: Secondary | ICD-10-CM | POA: Diagnosis not present

## 2022-05-08 DIAGNOSIS — D069 Carcinoma in situ of cervix, unspecified: Secondary | ICD-10-CM | POA: Diagnosis not present

## 2022-05-08 DIAGNOSIS — N3946 Mixed incontinence: Secondary | ICD-10-CM | POA: Diagnosis not present

## 2022-05-08 MED ORDER — ESTRADIOL 0.1 MG/GM VA CREA: TOPICAL_CREAM | VAGINAL | 3 refills | Status: DC

## 2022-05-08 NOTE — Progress Notes (Signed)
61 y.o. MWF here in referral from Michelene Gardener, NP, due to abnormal pap smear obtained 01/2022 showing LGSIL findings with +HR HPV.  Pt has not had this in the past.  Evaluation with colposcopy discussed.  Pt is PMP and does have a lot of vaginal atrophy as well.  Has incontinence both urgency and with laughing or lifting.  No interested in surgery.  PT discussed.  Pt is open to this.  Colposcopy with biopsy(ies) and ECC recommended today.  Consent obtained.  No LMP recorded (lmp unknown). Patient is postmenopausal.            Patient has been counseled about results and procedure.  Risks and benefits have bene reviewed including immediate and/or delayed bleeding, infection, cervical scaring from procedure, possibility of needing additional follow up as well as treatment.  Rare risks of missing a lesion discussed as well.  All questions answered.  Pt ready to proceed.  Consent obtained.  BP (!) 144/76 (BP Location: Right Arm, Patient Position: Sitting, Cuff Size: Normal)   Pulse (!) 59   Ht 5' 3.5" (1.613 m) Comment: Reported  Wt 162 lb 9.6 oz (73.8 kg)   LMP  (LMP Unknown)   BMI 28.35 kg/m   General appearance: alert, cooperative and appears stated age Lymph nodes: No abnormal inguinal nodes palpated Neurologic: Grossly normal  Pelvic: External genitalia:  no lesions              Urethra:  normal appearing urethra with no masses, tenderness or lesions              Bartholins and Skenes: normal                 Vagina: normal appearing vagina with normal color and no discharge, no lesions               Physical Exam Constitutional:      Appearance: Normal appearance.  Abdominal:     Hernia: There is no hernia in the left inguinal area or right inguinal area.  Genitourinary:    Labia:        Right: No tenderness or lesion.        Left: No tenderness or lesion.      Urethra: No urethral lesion.     Cervix: Normal.     Uterus: Normal.      Adnexa: Right adnexa normal and left adnexa  normal.        Comments: Atrophic vaginal tissue noted. Lymphadenopathy:     Lower Body: No right inguinal adenopathy. No left inguinal adenopathy.  Neurological:     General: No focal deficit present.     Mental Status: She is alert.  Psychiatric:        Mood and Affect: Mood normal.    Speculum placed.  3% acetic acid applied to cervix for >45 seconds.  Cervix visualized with both 7.5X and 15X magnification.  Green filter also used.  Lugols solution was used.  Findings:  decreased staining with lugols at both 6 and 12 o'clock.  Biopsy:  at 6 and 12 o'clock obtained and placed in same via.  ECC:  was performed.  Monsel's was needed.  Excellent hemostasis was present.  Pt tolerated procedure well and all instruments were removed.  Findings noted above on picture of cervix.   Assessment/Plan: 1. LGSIL on Pap smear of cervix - Surgical pathology( [place]/ [facility]) - results and recommendations will be called to pt  2. 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 Mixed incontinence - Ambulatory referral to Physical Therapy

## 2022-05-08 NOTE — Patient Instructions (Signed)
Colposcopy, Care After  The following information offers guidance on how to care for yourself after your procedure. Your doctor may also give you more specific instructions. If you have problems or questions, contact your doctor. What can I expect after the procedure? If you did not have a sample of your tissue taken out (did not have a biopsy), you may only have some spotting of blood for a few days. You can go back to your normal activities. If you had a sample of your tissue taken out, it is common to have: Soreness and mild pain. These may last for a few days. Mild bleeding or fluid (discharge) coming from your vagina. The fluid will look dark and grainy. You may have this for a few days. The fluid may be caused by a liquid that was used during your procedure. You may need to wear a sanitary pad. Spotting of blood for at least 48 hours after the procedure. Follow these instructions at home: Medicines Take over-the-counter and prescription medicines only as told by your doctor. Ask your doctor what over-the-counter pain medicines and prescription medicines you can start taking again. This is very important if you take blood thinners. Activity For at least 3 days, or for as long as told by your doctor, avoid: Douching. Using tampons. Having sex. Return to your normal activities as told by your doctor. Ask your doctor what activities are safe for you. General instructions Ask your doctor if you may take baths, swim, or use a hot tub. You may take showers. If you use birth control (contraception), keep using it. Keep all follow-up visits. Contact a doctor if: You have a fever or chills. You faint or feel light-headed. Get help right away if: You bleed a lot from your vagina. A lot of bleeding means that the bleeding soaks through a pad in less than 1 hour. You have clumps of blood (blood clots) coming from your vagina. You have signs that could mean you have an infection. This may be  fluid coming from your vagina that is: Different than normal. Yellow. Bad-smelling. You have very bad pain or cramps in your lower belly that do not get better with medicine. Summary If you did not have a sample of your tissue taken out, you may only have some spotting of blood for a few days. You can go back to your normal activities. If you had a sample of your tissue taken out, it is common to have mild pain for a few days and spotting for 48 hours. Avoid douching, using tampons, and having sex for at least 3 days after the procedure or for as long as told. Get help right away if you have a lot of bleeding, very bad pain, or signs of infection. This information is not intended to replace advice given to you by your health care provider. Make sure you discuss any questions you have with your health care provider. Document Revised: 01/29/2021 Document Reviewed: 01/29/2021 Elsevier Patient Education  2023 [employer] Inc.  

## 2022-05-10 LAB — SURGICAL PATHOLOGY

## 2022-05-11 NOTE — Progress Notes (Signed)
Leep procedure recommended due to cervical dysplasia

## 2022-06-11 ENCOUNTER — Encounter (HOSPITAL_BASED_OUTPATIENT_CLINIC_OR_DEPARTMENT_OTHER): Payer: Self-pay | Admitting: Obstetrics & Gynecology

## 2022-06-11 ENCOUNTER — Ambulatory Visit (HOSPITAL_BASED_OUTPATIENT_CLINIC_OR_DEPARTMENT_OTHER): Payer: BC Managed Care – PPO | Admitting: Obstetrics & Gynecology

## 2022-06-11 ENCOUNTER — Other Ambulatory Visit (HOSPITAL_COMMUNITY)
Admission: RE | Admit: 2022-06-11 | Discharge: 2022-06-11 | Disposition: A | Discharge status: Home / Self Care | Payer: BC Managed Care – PPO | Source: Ambulatory Visit | Attending: Obstetrics & Gynecology | Admitting: Obstetrics & Gynecology

## 2022-06-11 DIAGNOSIS — N870 Mild cervical dysplasia: Secondary | ICD-10-CM | POA: Diagnosis not present

## 2022-06-11 DIAGNOSIS — D069 Carcinoma in situ of cervix, unspecified: Secondary | ICD-10-CM | POA: Insufficient documentation

## 2022-06-11 DIAGNOSIS — N72 Inflammatory disease of cervix uteri: Secondary | ICD-10-CM | POA: Diagnosis not present

## 2022-06-11 DIAGNOSIS — N871 Moderate cervical dysplasia: Secondary | ICD-10-CM | POA: Insufficient documentation

## 2022-06-11 DIAGNOSIS — B977 Papillomavirus as the cause of diseases classified elsewhere: Secondary | ICD-10-CM | POA: Diagnosis not present

## 2022-06-14 LAB — SURGICAL PATHOLOGY

## 2022-06-16 NOTE — Progress Notes (Signed)
61 y.o. Married white female here for LEEP due to CIN2/3 noted with biopsies at colposcopy performed 05/08/2022.  Pap obtained before colposcopy showed LGSIL with +HR HPV.    No LMP recorded (lmp unknown). Patient is postmenopausal.          Sexually active: Yes.    The current method of family planning is post menopausal status.     Pre-procedure vitals: Blood pressure (!) 169/92, pulse 83, weight 161 lb 12.8 oz (73.4 kg).   Procedure explained and patient's questions were invited and answered.   Consent form signed.  Pre-procedure medication:  Motrin '800mg'$  po x 2.    Procedure Set-up: Grounding pad located right thigh.  Cautery settings: 50 cut/50 coagulation.  Suction applied to coated speculum.  Procedure:  Speculum placed with good visualization of the cervix.  Colposcopy performed showing:  acetowhite lesion(s) noted at 12 and 6 o'clock.  Cervix anesthetized using 2% Xylocaine with 1:100,000units Epinephrine.  6 cc's used.  Entire transition zone excised with 12 x 15 loop in 1 passes.  Specimen(s) placed on cork and labeled for pathology.  Hemostasis obtained with ball cautery and Monsel's solution.  EBL:  Minimal  Complications:  none.  Patient tolerated procedure well and left the office in satisfactory condition.  Assessment/Plan: 1. Dysplasia of cervix, high grade CIN 2 - Surgical pathology( [place]/ [facility]) - Follow up 1 month for recheck - Pathology will be called to pt and follow up Pap smear planned at that time. - Post procedure instructions reviewed including pelvic rest until follow up  2. High grade squamous intraepithelial lesion (HGSIL), grade 3 CIN, on biopsy of cervix

## 2022-07-03 ENCOUNTER — Other Ambulatory Visit: Payer: Self-pay | Admitting: Nurse Practitioner

## 2022-07-03 ENCOUNTER — Other Ambulatory Visit: Payer: Self-pay

## 2022-07-03 DIAGNOSIS — G8929 Other chronic pain: Secondary | ICD-10-CM

## 2022-07-03 DIAGNOSIS — N393 Stress incontinence (female) (male): Secondary | ICD-10-CM

## 2022-07-03 DIAGNOSIS — E782 Mixed hyperlipidemia: Secondary | ICD-10-CM

## 2022-07-03 MED ORDER — OXYBUTYNIN CHLORIDE ER 15 MG PO TB24: 15.0000 mg | ORAL_TABLET | Freq: Every day | ORAL | 0 refills | Status: DC

## 2022-07-09 ENCOUNTER — Ambulatory Visit (HOSPITAL_BASED_OUTPATIENT_CLINIC_OR_DEPARTMENT_OTHER): Payer: BC Managed Care – PPO | Admitting: Obstetrics & Gynecology

## 2022-07-09 ENCOUNTER — Encounter (HOSPITAL_BASED_OUTPATIENT_CLINIC_OR_DEPARTMENT_OTHER): Payer: Self-pay | Admitting: Obstetrics & Gynecology

## 2022-07-09 VITALS — BP 156/88 | HR 66 | Ht 63.0 in | Wt 166.0 lb

## 2022-07-09 DIAGNOSIS — Z9889 Other specified postprocedural states: Secondary | ICD-10-CM | POA: Diagnosis not present

## 2022-07-09 DIAGNOSIS — D069 Carcinoma in situ of cervix, unspecified: Secondary | ICD-10-CM | POA: Diagnosis not present

## 2022-07-09 NOTE — Progress Notes (Unsigned)
GYNECOLOGY  VISIT  CC:   recheck after LEEP  HPI: 61 y.o.  Married White or Caucasian female here for recheck after undergoing LEEP.  Has done well.  Denies bleeding or discharge at this point.  Has not been SA.  Pap 02/07/2022 showed LGSIL findings with +HR HPV.  Biopsy showed CIN 3.  LEEP done 06/11/2022 with CIN 1 only and negative ECC.  Margins were negative.  Follow up pap smear screening guidelines reviewed.   Past Medical History:  Diagnosis Date   Allergy    Anxiety    Arthritis    Blood in stool    Chicken pox    Depression    Frequent headaches    Hyperlipidemia    Urine incontinence     MEDS:   Current Outpatient Medications on File Prior to Visit  Medication Sig Dispense Refill   Calcium Carb-Cholecalciferol (CALCIUM 600 + D PO) Take 1 tablet by mouth daily.     Cholecalciferol (VITAMIN D PO) Take 2,000 tablets by mouth daily.     cyanocobalamin 1000 MCG tablet Take 1,000 mcg by mouth daily.     estradiol (ESTRACE) 0.1 MG/GM vaginal cream 1 gram vaginally twice weekly 42.5 g 3   fexofenadine (ALLEGRA) 180 MG tablet Take 180 mg by mouth daily.     fluticasone (FLONASE) 50 MCG/ACT nasal spray Place 2 sprays into both nostrils daily. 16 g 6   meloxicam (MOBIC) 15 MG tablet TAKE 1 TABLET(15 MG) BY MOUTH DAILY 30 tablet 2   oxybutynin (DITROPAN XL) 15 MG 24 hr tablet Take 1 tablet (15 mg total) by mouth daily. 90 tablet 0   sertraline (ZOLOFT) 50 MG tablet Take 75 mg (1.5 tablets) po QPM. May increase to 100 mg po QHS as indicated. 180 tablet 1   simvastatin (ZOCOR) 20 MG tablet TAKE 1 TABLET(20 MG) BY MOUTH DAILY AT 6 PM 90 tablet 1   Current Facility-Administered Medications on File Prior to Visit  Medication Dose Route Frequency Provider Last Rate Last Admin   0.9 %  sodium chloride infusion  500 mL Intravenous Once Pyrtle, Lajuan Lines, MD        ALLERGIES: Patient has no known allergies.  SH:  married, non smoker  Review of Systems  Constitutional: Negative.      PHYSICAL EXAMINATION:    BP (!) 156/88 (BP Location: Right Arm, Patient Position: Sitting, Cuff Size: Large)   Pulse 66   Ht '5\' 3"'$  (1.6 m) Comment: Reported  Wt 166 lb (75.3 kg)   LMP  (LMP Unknown)   BMI 29.41 kg/m     General appearance: alert, cooperative and appears stated age Lymph:  no inguinal LAD noted  Pelvic: External genitalia:  no lesions              Urethra:  normal appearing urethra with no masses, tenderness or lesions              Bartholins and Skenes: normal                 Vagina: normal appearing vagina with normal color and discharge, no lesions              Cervix: no lesions, healing well, no bleeding           Assessment/Plan: 1. High grade squamous intraepithelial lesion (HGSIL), grade 3 CIN, on biopsy of cervix - final pathology with LEEP was CIN 1 only and negative ECC.  Repeat pap smear 6 months and  1 year will be needed for initial follow up.  2. History of loop electrical excision procedure (LEEP)

## 2022-07-11 ENCOUNTER — Telehealth (HOSPITAL_BASED_OUTPATIENT_CLINIC_OR_DEPARTMENT_OTHER): Payer: Self-pay | Admitting: *Deleted

## 2022-07-11 DIAGNOSIS — Z9889 Other specified postprocedural states: Secondary | ICD-10-CM | POA: Insufficient documentation

## 2022-07-11 DIAGNOSIS — D069 Carcinoma in situ of cervix, unspecified: Secondary | ICD-10-CM | POA: Insufficient documentation

## 2022-07-11 NOTE — Telephone Encounter (Signed)
Attempted to call pt regarding upcoming appt. Voicemail full, unable to leave message.

## 2022-07-11 NOTE — Telephone Encounter (Signed)
-----   Message from Mary S Miller, MD sent at 07/11/2022  3:45 AM EDT ----- Regarding: follow up pap Kim, This pt was seen on Monday.  H/o LGSIL pap.  CIN 3 on biopsy.  LEEP only showed CIN 1.  She is scheduled for AEX in June.  I reviewed guidelines and she actually needs a follow up pap in 6 months.  Can you call and change her appt to follow up pap 6 months.  She is doing her wellness with PCP.  Will plan pap and then follow up after that result.  Thanks.  Suzanne  

## 2022-07-16 ENCOUNTER — Telehealth (HOSPITAL_BASED_OUTPATIENT_CLINIC_OR_DEPARTMENT_OTHER): Payer: Self-pay | Admitting: *Deleted

## 2022-07-16 NOTE — Telephone Encounter (Signed)
Attempted to call pt regarding appt. Voicemail full, uanble to leave message.

## 2022-07-16 NOTE — Telephone Encounter (Signed)
-----   Message from Megan Salon, MD sent at 07/11/2022  3:45 AM EDT ----- Regarding: follow up pap Pamela Francis, This pt was seen on Monday.  H/o LGSIL pap.  CIN 3 on biopsy.  LEEP only showed CIN 1.  She is scheduled for AEX in June.  I reviewed guidelines and she actually needs a follow up pap in 6 months.  Can you call and change her appt to follow up pap 6 months.  She is doing her wellness with PCP.  Will plan pap and then follow up after that result.  Thanks.  Vinnie Level

## 2022-07-19 ENCOUNTER — Encounter (HOSPITAL_BASED_OUTPATIENT_CLINIC_OR_DEPARTMENT_OTHER): Payer: Self-pay | Admitting: *Deleted

## 2022-07-19 ENCOUNTER — Telehealth (HOSPITAL_BASED_OUTPATIENT_CLINIC_OR_DEPARTMENT_OTHER): Payer: Self-pay | Admitting: *Deleted

## 2022-07-19 NOTE — Telephone Encounter (Signed)
-----   Message from Megan Salon, MD sent at 07/11/2022  3:45 AM EDT ----- Regarding: follow up pap Pamela Francis, This pt was seen on Monday.  H/o LGSIL pap.  CIN 3 on biopsy.  LEEP only showed CIN 1.  She is scheduled for AEX in June.  I reviewed guidelines and she actually needs a follow up pap in 6 months.  Can you call and change her appt to follow up pap 6 months.  She is doing her wellness with PCP.  Will plan pap and then follow up after that result.  Thanks.  Vinnie Level

## 2022-07-19 NOTE — Telephone Encounter (Signed)
Attempted to call pt regarding recommendations for upcoming appt. Mailbox full. Unable to leave a message.

## 2022-08-08 ENCOUNTER — Encounter: Payer: Self-pay | Admitting: Nurse Practitioner

## 2022-08-08 ENCOUNTER — Ambulatory Visit: Payer: BC Managed Care – PPO | Admitting: Nurse Practitioner

## 2022-08-08 VITALS — BP 147/86 | HR 68 | Ht 63.0 in | Wt 166.4 lb

## 2022-08-08 DIAGNOSIS — H9313 Tinnitus, bilateral: Secondary | ICD-10-CM | POA: Diagnosis not present

## 2022-08-08 DIAGNOSIS — I10 Essential (primary) hypertension: Secondary | ICD-10-CM | POA: Diagnosis not present

## 2022-08-08 DIAGNOSIS — Z23 Encounter for immunization: Secondary | ICD-10-CM | POA: Diagnosis not present

## 2022-08-08 MED ORDER — LISINOPRIL 2.5 MG PO TABS: 2.5000 mg | ORAL_TABLET | Freq: Every day | ORAL | 0 refills | Status: DC

## 2022-08-08 NOTE — Progress Notes (Signed)
Established patient visit   Patient: Pamela Francis   DOB: October 31, 1960   61 y.o. Female  MRN: [id number] Visit Date: 08/08/2022   Chief Complaint  Patient presents with   Follow-up   Subjective    HPI  Follow up -increased sertraline to 75 mg daily.  -takes this at night.  -doing well. Sleeping better   Tinnitus in both ears.  -has noted for last month  -denies pain in ears -denies dificulty hearing  -feels like its all the time.  -notices much more when it's quiet.   Elevated blood pressure -noted that it was elevated at GYN office the past few visits.    Medications: Outpatient Medications Prior to Visit  Medication Sig   Calcium Carb-Cholecalciferol (CALCIUM 600 + D PO) Take 1 tablet by mouth daily.   Cholecalciferol (VITAMIN D PO) Take 2,000 tablets by mouth daily.   cyanocobalamin 1000 MCG tablet Take 1,000 mcg by mouth daily.   estradiol (ESTRACE) 0.1 MG/GM vaginal cream 1 gram vaginally twice weekly   fexofenadine (ALLEGRA) 180 MG tablet Take 180 mg by mouth daily.   fluticasone (FLONASE) 50 MCG/ACT nasal spray Place 2 sprays into both nostrils daily.   meloxicam (MOBIC) 15 MG tablet TAKE 1 TABLET(15 MG) BY MOUTH DAILY   oxybutynin (DITROPAN XL) 15 MG 24 hr tablet Take 1 tablet (15 mg total) by mouth daily.   sertraline (ZOLOFT) 50 MG tablet Take 75 mg (1.5 tablets) po QPM. May increase to 100 mg po QHS as indicated.   simvastatin (ZOCOR) 20 MG tablet TAKE 1 TABLET(20 MG) BY MOUTH DAILY AT 6 PM   Facility-Administered Medications Prior to Visit  Medication Dose Route Frequency Provider   0.9 %  sodium chloride infusion  500 mL Intravenous Once Pyrtle, Lajuan Lines, MD    Review of Systems  Constitutional:  Negative for activity change, appetite change, chills, fatigue and fever.  HENT:  Negative for congestion, postnasal drip, rhinorrhea, sinus pressure, sinus pain, sneezing and sore throat.   Eyes: Negative.   Respiratory:  Negative for cough, chest tightness,  shortness of breath and wheezing.   Cardiovascular:  Negative for chest pain and palpitations.  Gastrointestinal:  Negative for abdominal pain, constipation, diarrhea, nausea and vomiting.  Endocrine: Negative for cold intolerance, heat intolerance, polydipsia and polyuria.  Genitourinary:  Negative for dyspareunia, dysuria, flank pain, frequency and urgency.  Musculoskeletal:  Negative for arthralgias, back pain and myalgias.  Skin:  Negative for rash.  Allergic/Immunologic: Negative for environmental allergies.  Neurological:  Negative for dizziness, weakness and headaches.  Hematological:  Negative for adenopathy.  Psychiatric/Behavioral:  The patient is not nervous/anxious.        Objective     Today's Vitals   08/08/22 1556 08/08/22 1612  BP: (Abnormal) 147/88 (Abnormal) 147/86  Pulse: 68   SpO2: 97%   Weight: 166 lb 6.4 oz (75.5 kg)   Height: '5\' 3"'$  (1.6 m)    Body mass index is 29.48 kg/m.  BP Readings from Last 3 Encounters:  08/08/22 (Abnormal) 147/86  07/09/22 (Abnormal) 156/88  06/11/22 (Abnormal) 169/92    Wt Readings from Last 3 Encounters:  08/08/22 166 lb 6.4 oz (75.5 kg)  07/09/22 166 lb (75.3 kg)  06/11/22 161 lb 12.8 oz (73.4 kg)    Physical Exam Vitals and nursing note reviewed.  Constitutional:      Appearance: Normal appearance. She is well-developed.  HENT:     Head: Normocephalic and atraumatic.     Nose: Nose normal.  Mouth/Throat:     Mouth: Mucous membranes are moist.     Pharynx: Oropharynx is clear.  Eyes:     Extraocular Movements: Extraocular movements intact.     Conjunctiva/sclera: Conjunctivae normal.     Pupils: Pupils are equal, round, and reactive to light.  Cardiovascular:     Rate and Rhythm: Normal rate and regular rhythm.     Pulses: Normal pulses.     Heart sounds: Normal heart sounds.  Pulmonary:     Effort: Pulmonary effort is normal.     Breath sounds: Normal breath sounds.  Abdominal:     Palpations: Abdomen  is soft.  Musculoskeletal:        General: Normal range of motion.     Cervical back: Normal range of motion and neck supple.  Lymphadenopathy:     Cervical: No cervical adenopathy.  Skin:    General: Skin is warm and dry.     Capillary Refill: Capillary refill takes less than 2 seconds.  Neurological:     General: No focal deficit present.     Mental Status: She is alert and oriented to person, place, and time.  Psychiatric:        Mood and Affect: Mood normal.        Behavior: Behavior normal.        Thought Content: Thought content normal.        Judgment: Judgment normal.       Assessment & Plan     1. Essential hypertension Start lisinopril 2.5 mg daily. Advise the patient to avoid use of excess salt in the diet and increase water daily. Reassess in 6 weeks.  - lisinopril (ZESTRIL) 2.5 MG tablet; Take 1 tablet (2.5 mg total) by mouth daily.  Dispense: 90 tablet; Refill: 0  2. Tinnitus of both ears Unclear etiology, but possibly due to hypertension. Start lisinopril 2.5 mg daily. Reassess in 6 weeks.   3. Need for shingles vaccine Shingles vaccine administered today - Varicella-zoster vaccine IM   Problem List Items Addressed This Visit       Cardiovascular and Mediastinum   Essential hypertension - Primary   Relevant Medications   lisinopril (ZESTRIL) 2.5 MG tablet     Other   Tinnitus of both ears   Other Visit Diagnoses     Need for shingles vaccine       Relevant Orders   Varicella-zoster vaccine IM (Completed)        Return in about 6 weeks (around 09/19/2022) for blood pressure. started lisinopril 2.5 mg daily .         Ronnell Freshwater, NP  [facility] [phone] (phone) [phone] (fax)  [place]

## 2022-08-19 DIAGNOSIS — I10 Essential (primary) hypertension: Secondary | ICD-10-CM | POA: Insufficient documentation

## 2022-08-19 DIAGNOSIS — H9313 Tinnitus, bilateral: Secondary | ICD-10-CM | POA: Insufficient documentation

## 2022-08-20 ENCOUNTER — Ambulatory Visit (INDEPENDENT_AMBULATORY_CARE_PROVIDER_SITE_OTHER): Payer: BC Managed Care – PPO | Admitting: Obstetrics & Gynecology

## 2022-08-20 ENCOUNTER — Other Ambulatory Visit (HOSPITAL_COMMUNITY)
Admission: RE | Admit: 2022-08-20 | Discharge: 2022-08-20 | Disposition: A | Discharge status: Home / Self Care | Payer: BC Managed Care – PPO | Source: Ambulatory Visit | Attending: Obstetrics & Gynecology | Admitting: Obstetrics & Gynecology

## 2022-08-20 ENCOUNTER — Encounter (HOSPITAL_BASED_OUTPATIENT_CLINIC_OR_DEPARTMENT_OTHER): Payer: Self-pay | Admitting: Obstetrics & Gynecology

## 2022-08-20 VITALS — BP 145/80 | HR 60 | Ht 63.0 in | Wt 164.8 lb

## 2022-08-20 DIAGNOSIS — Z803 Family history of malignant neoplasm of breast: Secondary | ICD-10-CM | POA: Diagnosis not present

## 2022-08-20 DIAGNOSIS — D069 Carcinoma in situ of cervix, unspecified: Secondary | ICD-10-CM

## 2022-08-20 DIAGNOSIS — Z8049 Family history of malignant neoplasm of other genital organs: Secondary | ICD-10-CM

## 2022-08-22 LAB — CYTOLOGY - PAP
Adequacy: ABSENT
Comment: NEGATIVE
High risk HPV: POSITIVE — AB

## 2022-08-23 NOTE — Progress Notes (Signed)
GYNECOLOGY  VISIT  CC:   follow up pap smear and other questions  HPI: 61 y.o. Married White or Caucasian female here for follow up pap smear.  Pt had pap smear 02/06/2022 showing LGSIL with +HR HPV.  Colposcopy showed CIN3.  LEEP was performed 06/11/2022 showing only CIN 1 with negative margins.  Here for repeat pap today.  Denies vaginal bleeding.    Has other questions.  Reports new information about family and several members with breast cancer.  Has 3 cousins (who were sisters) who have all developed breast cancer.  One of these cousins did genetic testing showing PALB2 gene mutation.  Pt would like help getting this testing done.  Referral to medical genetics discussed.  She would like to pursue this.     Past Medical History:  Diagnosis Date   Allergy    Anxiety    Arthritis    Blood in stool    Chicken pox    Depression    Frequent headaches    Hyperlipidemia    Urine incontinence     MEDS:   Current Outpatient Medications on File Prior to Visit  Medication Sig Dispense Refill   Calcium Carb-Cholecalciferol (CALCIUM 600 + D PO) Take 1 tablet by mouth daily.     Cholecalciferol (VITAMIN D PO) Take 2,000 tablets by mouth daily.     cyanocobalamin 1000 MCG tablet Take 1,000 mcg by mouth daily.     estradiol (ESTRACE) 0.1 MG/GM vaginal cream 1 gram vaginally twice weekly 42.5 g 3   fexofenadine (ALLEGRA) 180 MG tablet Take 180 mg by mouth daily.     fluticasone (FLONASE) 50 MCG/ACT nasal spray Place 2 sprays into both nostrils daily. 16 g 6   lisinopril (ZESTRIL) 2.5 MG tablet Take 1 tablet (2.5 mg total) by mouth daily. 90 tablet 0   meloxicam (MOBIC) 15 MG tablet TAKE 1 TABLET(15 MG) BY MOUTH DAILY 30 tablet 2   oxybutynin (DITROPAN XL) 15 MG 24 hr tablet Take 1 tablet (15 mg total) by mouth daily. 90 tablet 0   sertraline (ZOLOFT) 50 MG tablet Take 75 mg (1.5 tablets) po QPM. May increase to 100 mg po QHS as indicated. 180 tablet 1   simvastatin (ZOCOR) 20 MG tablet TAKE 1  TABLET(20 MG) BY MOUTH DAILY AT 6 PM 90 tablet 1   Current Facility-Administered Medications on File Prior to Visit  Medication Dose Route Frequency Provider Last Rate Last Admin   0.9 %  sodium chloride infusion  500 mL Intravenous Once Pyrtle, Lajuan Lines, MD        ALLERGIES: Patient has no known allergies.  SH:  married, non smoker  Review of Systems  Constitutional: Negative.     PHYSICAL EXAMINATION:    BP (!) 145/80 (BP Location: Left Arm, Patient Position: Sitting, Cuff Size: Large)   Pulse 60   Ht _0  (1.6 m) Comment: Reported  Wt 164 lb 12.8 oz (74.8 kg)   LMP  (LMP Unknown)   BMI 29.19 kg/m     General appearance: alert, cooperative and appears stated age Lymph:  no inguinal LAD noted  Pelvic: External genitalia:  no lesions              Urethra:  normal appearing urethra with no masses, tenderness or lesions              Bartholins and Skenes: normal                 Vagina:  normal appearing vagina with normal color and discharge, no lesions              Cervix: no lesions               Chaperone, Octaviano Batty, CMA, was present for exam.  Assessment/Plan: 1. High grade squamous intraepithelial lesion (HGSIL), grade 3 CIN, on biopsy of cervix - Cytology - PAP( [place])  2. Family history of breast cancer - Ambulatory referral to Genetics  3. Family history of uterine cancer

## 2022-09-12 ENCOUNTER — Telehealth: Payer: BC Managed Care – PPO | Admitting: Physician Assistant

## 2022-09-12 DIAGNOSIS — U071 COVID-19: Secondary | ICD-10-CM | POA: Diagnosis not present

## 2022-09-12 MED ORDER — BENZONATATE 100 MG PO CAPS: 100.0000 mg | ORAL_CAPSULE | Freq: Three times a day (TID) | ORAL | 0 refills | Status: DC | PRN

## 2022-09-12 MED ORDER — PROMETHAZINE-DM 6.25-15 MG/5ML PO SYRP: 5.0000 mL | ORAL_SOLUTION | Freq: Four times a day (QID) | ORAL | 0 refills | Status: DC | PRN

## 2022-09-12 MED ORDER — NIRMATRELVIR/RITONAVIR (PAXLOVID)TABLET: 3.0000 | ORAL_TABLET | Freq: Two times a day (BID) | ORAL | 0 refills | Status: AC

## 2022-09-12 NOTE — Progress Notes (Signed)
Virtual Visit Consent   Pamela Francis, you are scheduled for a virtual visit with a [place] provider today. Just as with appointments in the office, your consent must be obtained to participate. Your consent will be active for this visit and any virtual visit you may have with one of our providers in the next 365 days. If you have a MyChart account, a copy of this consent can be sent to you electronically.  As this is a virtual visit, video technology does not allow for your provider to perform a traditional examination. This may limit your provider's ability to fully assess your condition. If your provider identifies any concerns that need to be evaluated in person or the need to arrange testing (such as labs, EKG, etc.), we will make arrangements to do so. Although advances in technology are sophisticated, we cannot ensure that it will always work on either your end or our end. If the connection with a video visit is poor, the visit may have to be switched to a telephone visit. With either a video or telephone visit, we are not always able to ensure that we have a secure connection.  By engaging in this virtual visit, you consent to the provision of healthcare and authorize for your insurance to be billed (if applicable) for the services provided during this visit. Depending on your insurance coverage, you may receive a charge related to this service.  I need to obtain your verbal consent now. Are you willing to proceed with your visit today? Pamela Francis has provided verbal consent on 09/12/2022 for a virtual visit (video or telephone). Pamela Daring, PA-C  Date: 09/12/2022 4:58 PM  Virtual Visit via Video Note   I, Pamela Francis, connected with  Pamela Francis  ([id number], 1961/05/15) on 09/12/22 at  4:45 PM EST by a video-enabled telemedicine application and verified that I am speaking with the correct person using two identifiers.  Location: Patient: Virtual Visit  Location Patient: Home Provider: Virtual Visit Location Provider: Home Office   I discussed the limitations of evaluation and management by telemedicine and the availability of in person appointments. The patient expressed understanding and agreed to proceed.    History of Present Illness: Pamela Francis is a 61 y.o. who identifies as a female who was assigned female at birth, and is being seen today for Covid 38.  HPI: URI  This is a new problem. The current episode started yesterday (09/11/22). The problem has been gradually worsening. The maximum temperature recorded prior to her arrival was 102 - 102.9 F. The fever has been present for Less than 1 day. Associated symptoms include congestion, coughing, headaches, nausea, rhinorrhea, sinus pain, a sore throat and vomiting (yesterday). Pertinent negatives include no diarrhea, ear pain or plugged ear sensation. Associated symptoms comments: Myalgias. Treatments tried: cough syrup. The treatment provided no relief.      Problems:  Patient Active Problem List   Diagnosis Date Noted   Essential hypertension 08/19/2022   Tinnitus of both ears 08/19/2022   High grade squamous intraepithelial lesion (HGSIL), grade 3 CIN, on biopsy of cervix 07/11/2022   History of loop electrical excision procedure (LEEP) 07/11/2022   Sore throat 05/07/2022   Chronic bilateral low back pain with bilateral sciatica 12/31/2021   Stress incontinence 12/31/2021   Body mass index 28.0-28.9, adult 12/31/2021   Encounter for screening mammogram for malignant neoplasm of breast 12/31/2021   Osteoarthritis 11/06/2018   Mixed hyperlipidemia 12/20/2014   Frequent headaches 12/20/2014  Anxiety and depression 12/20/2014    Allergies: No Known Allergies Medications:  Current Outpatient Medications:    benzonatate (TESSALON) 100 MG capsule, Take 1 capsule (100 mg total) by mouth 3 (three) times daily as needed., Disp: 30 capsule, Rfl: 0   Calcium Carb-Cholecalciferol  (CALCIUM 600 + D PO), Take 1 tablet by mouth daily., Disp: , Rfl:    Cholecalciferol (VITAMIN D PO), Take 2,000 tablets by mouth daily., Disp: , Rfl:    cyanocobalamin 1000 MCG tablet, Take 1,000 mcg by mouth daily., Disp: , Rfl:    estradiol (ESTRACE) 0.1 MG/GM vaginal cream, 1 gram vaginally twice weekly, Disp: 42.5 g, Rfl: 3   fexofenadine (ALLEGRA) 180 MG tablet, Take 180 mg by mouth daily., Disp: , Rfl:    fluticasone (FLONASE) 50 MCG/ACT nasal spray, Place 2 sprays into both nostrils daily., Disp: 16 g, Rfl: 6   lisinopril (ZESTRIL) 2.5 MG tablet, Take 1 tablet (2.5 mg total) by mouth daily., Disp: 90 tablet, Rfl: 0   meloxicam (MOBIC) 15 MG tablet, TAKE 1 TABLET(15 MG) BY MOUTH DAILY, Disp: 30 tablet, Rfl: 2   nirmatrelvir/ritonavir (PAXLOVID) 20 x 150 MG & 10 x '100MG'$  TABS, Take 3 tablets by mouth 2 (two) times daily for 5 days. (Take nirmatrelvir 150 mg two tablets twice daily for 5 days and ritonavir 100 mg one tablet twice daily for 5 days) Patient GFR is 92, Disp: 30 tablet, Rfl: 0   oxybutynin (DITROPAN XL) 15 MG 24 hr tablet, Take 1 tablet (15 mg total) by mouth daily., Disp: 90 tablet, Rfl: 0   promethazine-dextromethorphan (PROMETHAZINE-DM) 6.25-15 MG/5ML syrup, Take 5 mLs by mouth 4 (four) times daily as needed., Disp: 118 mL, Rfl: 0   sertraline (ZOLOFT) 50 MG tablet, Take 75 mg (1.5 tablets) po QPM. May increase to 100 mg po QHS as indicated., Disp: 180 tablet, Rfl: 1   simvastatin (ZOCOR) 20 MG tablet, TAKE 1 TABLET(20 MG) BY MOUTH DAILY AT 6 PM, Disp: 90 tablet, Rfl: 1  Current Facility-Administered Medications:    0.9 %  sodium chloride infusion, 500 mL, Intravenous, Once, Pyrtle, Lajuan Lines, MD  Observations/Objective: Patient is well-developed, well-nourished in no acute distress.  Resting comfortably at home.  Head is normocephalic, atraumatic.  No labored breathing.  Speech is clear and coherent with logical content.  Patient is alert and oriented at baseline.     Assessment and Plan: 1. COVID-19 - nirmatrelvir/ritonavir (PAXLOVID) 20 x 150 MG & 10 x '100MG'$  TABS; Take 3 tablets by mouth 2 (two) times daily for 5 days. (Take nirmatrelvir 150 mg two tablets twice daily for 5 days and ritonavir 100 mg one tablet twice daily for 5 days) Patient GFR is 92  Dispense: 30 tablet; Refill: 0 - MyChart COVID-19 home monitoring program; Future - promethazine-dextromethorphan (PROMETHAZINE-DM) 6.25-15 MG/5ML syrup; Take 5 mLs by mouth 4 (four) times daily as needed.  Dispense: 118 mL; Refill: 0 - benzonatate (TESSALON) 100 MG capsule; Take 1 capsule (100 mg total) by mouth 3 (three) times daily as needed.  Dispense: 30 capsule; Refill: 0  - Continue OTC symptomatic management of choice - Will send OTC vitamins and supplement information through AVS - Paxlovid prescribed -Promethazine DM and Tessalon prescribed for cough - Patient enrolled in MyChart symptom monitoring - Push fluids - Rest as needed - Discussed return precautions and when to seek in-person evaluation, sent via AVS as well   Follow Up Instructions: I discussed the assessment and treatment plan with the patient. The patient  was provided an opportunity to ask questions and all were answered. The patient agreed with the plan and demonstrated an understanding of the instructions.  A copy of instructions were sent to the patient via MyChart unless otherwise noted below.    The patient was advised to call back or seek an in-person evaluation if the symptoms worsen or if the condition fails to improve as anticipated.  Time:  I spent 12 minutes with the patient via telehealth technology discussing the above problems/concerns.    Pamela Daring, PA-C

## 2022-09-12 NOTE — Patient Instructions (Signed)
Thom Chimes, thank you for joining Mar Daring, PA-C for today's virtual visit.  While this provider is not your primary care provider (PCP), if your PCP is located in our provider database this encounter information will be shared with them immediately following your visit.   [facility] account gives you access to today's visit and all your visits, tests, and labs performed at [facility] " click here if you don't have a [facility] account or go to [email or web address]  Consent: (Patient) Ortha Metts provided verbal consent for this virtual visit at the beginning of the encounter.  Current Medications:  Current Outpatient Medications:    benzonatate (TESSALON) 100 MG capsule, Take 1 capsule (100 mg total) by mouth 3 (three) times daily as needed., Disp: 30 capsule, Rfl: 0   Calcium Carb-Cholecalciferol (CALCIUM 600 + D PO), Take 1 tablet by mouth daily., Disp: , Rfl:    Cholecalciferol (VITAMIN D PO), Take 2,000 tablets by mouth daily., Disp: , Rfl:    cyanocobalamin 1000 MCG tablet, Take 1,000 mcg by mouth daily., Disp: , Rfl:    estradiol (ESTRACE) 0.1 MG/GM vaginal cream, 1 gram vaginally twice weekly, Disp: 42.5 g, Rfl: 3   fexofenadine (ALLEGRA) 180 MG tablet, Take 180 mg by mouth daily., Disp: , Rfl:    fluticasone (FLONASE) 50 MCG/ACT nasal spray, Place 2 sprays into both nostrils daily., Disp: 16 g, Rfl: 6   lisinopril (ZESTRIL) 2.5 MG tablet, Take 1 tablet (2.5 mg total) by mouth daily., Disp: 90 tablet, Rfl: 0   meloxicam (MOBIC) 15 MG tablet, TAKE 1 TABLET(15 MG) BY MOUTH DAILY, Disp: 30 tablet, Rfl: 2   nirmatrelvir/ritonavir (PAXLOVID) 20 x 150 MG & 10 x '100MG'$  TABS, Take 3 tablets by mouth 2 (two) times daily for 5 days. (Take nirmatrelvir 150 mg two tablets twice daily for 5 days and ritonavir 100 mg one tablet twice daily for 5 days) Patient GFR is 92, Disp: 30 tablet, Rfl: 0   oxybutynin (DITROPAN XL) 15 MG 24 hr tablet, Take  1 tablet (15 mg total) by mouth daily., Disp: 90 tablet, Rfl: 0   promethazine-dextromethorphan (PROMETHAZINE-DM) 6.25-15 MG/5ML syrup, Take 5 mLs by mouth 4 (four) times daily as needed., Disp: 118 mL, Rfl: 0   sertraline (ZOLOFT) 50 MG tablet, Take 75 mg (1.5 tablets) po QPM. May increase to 100 mg po QHS as indicated., Disp: 180 tablet, Rfl: 1   simvastatin (ZOCOR) 20 MG tablet, TAKE 1 TABLET(20 MG) BY MOUTH DAILY AT 6 PM, Disp: 90 tablet, Rfl: 1  Current Facility-Administered Medications:    0.9 %  sodium chloride infusion, 500 mL, Intravenous, Once, Pyrtle, Lajuan Lines, MD   Medications ordered in this encounter:  Meds ordered this encounter  Medications   nirmatrelvir/ritonavir (PAXLOVID) 20 x 150 MG & 10 x '100MG'$  TABS    Sig: Take 3 tablets by mouth 2 (two) times daily for 5 days. (Take nirmatrelvir 150 mg two tablets twice daily for 5 days and ritonavir 100 mg one tablet twice daily for 5 days) Patient GFR is 92    Dispense:  30 tablet    Refill:  0    Order Specific Question:   Supervising Provider    Answer:   Chase Picket [[id number]]   promethazine-dextromethorphan (PROMETHAZINE-DM) 6.25-15 MG/5ML syrup    Sig: Take 5 mLs by mouth 4 (four) times daily as needed.    Dispense:  118 mL    Refill:  0    Order  Specific Question:   Supervising Provider    Answer:   Chase Picket [[id number]]   benzonatate (TESSALON) 100 MG capsule    Sig: Take 1 capsule (100 mg total) by mouth 3 (three) times daily as needed.    Dispense:  30 capsule    Refill:  0    Order Specific Question:   Supervising Provider    Answer:   Chase Picket [id number]     *If you need refills on other medications prior to your next appointment, please contact your pharmacy*  Follow-Up: Call back or seek an in-person evaluation if the symptoms worsen or if the condition fails to improve as anticipated.  [place] [phone]  Other Instructions  Can take to lessen severity: Vit C '500mg'$   twice daily Quercertin 250-'500mg'$  twice daily Zinc 75-'100mg'$  daily Melatonin 3-6 mg at bedtime Vit D3 1000-2000 IU daily Aspirin 81 mg daily with food Optional: Famotidine '20mg'$  daily Also can add tylenol/ibuprofen as needed for fevers and body aches May add Mucinex or Mucinex DM as needed for cough/congestion   COVID-19 COVID-19, or coronavirus disease 2019, is an infection that is caused by a new (novel) coronavirus called SARS-CoV-2. COVID-19 can cause many symptoms. In some people, the virus may not cause any symptoms. In others, it may cause mild or severe symptoms. Some people with severe infection develop severe disease. What are the causes? This illness is caused by a virus. The virus may be in the air as tiny specks of fluid (aerosols) or droplets, or it may be on surfaces. You may catch the virus by: Breathing in droplets from an infected person. Droplets can be spread by a person breathing, speaking, singing, coughing, or sneezing. Touching something, like a table or a doorknob, that has virus on it (is contaminated) and then touching your mouth, nose, or eyes. What increases the risk? Risk for infection: You are more likely to get infected with the COVID-19 virus if: You are within 6 ft (1.8 m) of a person with COVID-19 for 15 minutes or longer. You are providing care for a person who is infected with COVID-19. You are in close personal contact with other people. Close personal contact includes hugging, kissing, or sharing eating or drinking utensils. Risk for serious illness caused by COVID-19: You are more likely to get seriously ill from the COVID-19 virus if: You have cancer. You have a long-term (chronic) disease, such as: Chronic lung disease. This includes pulmonary embolism, chronic obstructive pulmonary disease, and cystic fibrosis. Long-term disease that lowers your body's ability to fight infection (immunocompromise). Serious cardiac conditions, such as heart failure,  coronary artery disease, or cardiomyopathy. Diabetes. Chronic kidney disease. Liver diseases. These include cirrhosis, nonalcoholic fatty liver disease, alcoholic liver disease, or autoimmune hepatitis. You have obesity. You are pregnant or were recently pregnant. You have sickle cell disease. What are the signs or symptoms? Symptoms of this condition can range from mild to severe. Symptoms may appear any time from 2 to 14 days after being exposed to the virus. They include: Fever or chills. Shortness of breath or trouble breathing. Feeling tired or very tired. Headaches, body aches, or muscle aches. Runny or stuffy nose, sneezing, coughing, or sore throat. New loss of taste or smell. This is rare. Some people may also have stomach problems, such as nausea, vomiting, or diarrhea. Other people may not have any symptoms of COVID-19. How is this diagnosed? This condition may be diagnosed by testing samples to check  for the COVID-19 virus. The most common tests are the PCR test and the antigen test. Tests may be done in the lab or at home. They include: Using a swab to take a sample of fluid from the back of your nose and throat (nasopharyngeal fluid), from your nose, or from your throat. Testing a sample of saliva from your mouth. Testing a sample of coughed-up mucus from your lungs (sputum). How is this treated? Treatment for COVID-19 infection depends on the severity of the condition. Mild symptoms can be managed at home with rest, fluids, and over-the-counter medicines. Serious symptoms may be treated in a hospital intensive care unit (ICU). Treatment in the ICU may include: Supplemental oxygen. Extra oxygen is given through a tube in the nose, a face mask, or a hood. Medicines. These may include: Antivirals, such as monoclonal antibodies. These help your body fight off certain viruses that can cause disease. Anti-inflammatories, such as corticosteroids. These reduce inflammation and  suppress the immune system. Antithrombotics. These prevent or treat blood clots, if they develop. Convalescent plasma. This helps boost your immune system, if you have an underlying immunosuppressive condition or are getting immunosuppressive treatments. Prone positioning. This means you will lie on your stomach. This helps oxygen to get into your lungs. Infection control measures. If you are at risk for more serious illness caused by COVID-19, your health care provider may prescribe two long-acting monoclonal antibodies, given together every 6 months. How is this prevented? To protect yourself: Use preventive medicine (pre-exposure prophylaxis). You may get pre-exposure prophylaxis if you have moderate or severe immunocompromise. Get vaccinated. Anyone 54 months old or older who meets guidelines can get a COVID-19 vaccine or vaccine series. This includes people who are pregnant or making breast milk (lactating). Get an added dose of COVID-19 vaccine after your first vaccine or vaccine series if you have moderate to severe immunocompromise. This applies if you have had a solid organ transplant or have been diagnosed with an immunocompromising condition. You should get the added dose 4 weeks after you got the first COVID-19 vaccine or vaccine series. If you get an mRNA vaccine, you will need a 3-dose primary series. If you get the J&J/Janssen vaccine, you will need a 2-dose primary series, with the second dose being an mRNA vaccine. Talk to your health care provider about getting experimental monoclonal antibodies. This treatment is approved under emergency use authorization to prevent severe illness before or after being exposed to the COVID-19 virus. You may be given monoclonal antibodies if: You have moderate or severe immunocompromise. This includes treatments that lower your immune response. People with immunocompromise may not develop protection against COVID-19 when they are vaccinated. You  cannot be vaccinated. You may not get a vaccine if you have a severe allergic reaction to the vaccine or its components. You are not fully vaccinated. You are in a facility where COVID-19 is present and: Are in close contact with a person who is infected with the COVID-19 virus. Are at high risk of being exposed to the COVID-19 virus. You are at risk of illness from new variants of the COVID-19 virus. To protect others: If you have symptoms of COVID-19, take steps to prevent the virus from spreading to others. Stay home. Leave your house only to get medical care. Do not use public transit, if possible. Do not travel while you are sick. Wash your hands often with soap and water for at least 20 seconds. If soap and water are not available, use  alcohol-based hand sanitizer. Make sure that all people in your household wash their hands well and often. Cough or sneeze into a tissue or your sleeve or elbow. Do not cough or sneeze into your hand or into the air. Where to find more information Centers for Disease Control and Prevention: CharmCourses.be World Health Organization: https://www.castaneda.info/ Get help right away if: You have trouble breathing. You have pain or pressure in your chest. You are confused. You have bluish lips and fingernails. You have trouble waking from sleep. You have symptoms that get worse. These symptoms may be an emergency. Get help right away. Call 911. Do not wait to see if the symptoms will go away. Do not drive yourself to the hospital. Summary COVID-19 is an infection that is caused by a new coronavirus. Sometimes, there are no symptoms. Other times, symptoms range from mild to severe. Some people with a severe COVID-19 infection develop severe disease. The virus that causes COVID-19 can spread from person to person through droplets or aerosols from breathing, speaking, singing, coughing, or sneezing. Mild symptoms of COVID-19 can be  managed at home with rest, fluids, and over-the-counter medicines. This information is not intended to replace advice given to you by your health care provider. Make sure you discuss any questions you have with your health care provider. Document Revised: 08/22/2021 Document Reviewed: 08/24/2021 Elsevier Patient Education  [place].    If you have been instructed to have an in-person evaluation today at a local Urgent Care facility, please use the link below. It will take you to a list of all of our available [facility], including address, phone number and hours of operation. Please do not delay care.  [facility]  If you or a family member do not have a primary care provider, use the link below to schedule a visit and establish care. When you choose a [facility] primary care physician or advanced practice provider, you gain a long-term partner in health. Find a Primary Care Provider  Learn more about [facility]'s in-office and virtual care options: [facility]

## 2022-09-12 NOTE — Progress Notes (Signed)
Thank you for the details you included in the comment boxes. Those details are very helpful in determining the best course of treatment for you and help [place] to provide the best care.Because you are desiring anti-viral treatment in the setting of Covid 19, we recommend that you convert this visit to a video visit in order for the provider to better assess what is going on.  The provider will be able to give you a more accurate diagnosis and treatment plan if we can more freely discuss your symptoms and with the addition of a virtual examination.   If you convert to a video visit, we will bill your insurance (similar to an office visit) and you will not be charged for this e-Visit. You will be able to stay at home and speak with the first available [facility] advanced practice provider. The link to do a video visit is in the drop down Menu tab of your Welcome screen in [place].  I have spent 5 minutes in review of e-visit questionnaire, review and updating patient chart, medical decision making and response to patient.   Mar Daring, PA-C

## 2022-09-23 NOTE — Progress Notes (Signed)
Established patient visit   Patient: Pamela Francis   DOB: 02-Sep-1961   62 y.o. Female  MRN: [id number] Visit Date: 09/24/2022   Chief Complaint  Patient presents with   Follow-up   Hypertension   Subjective    HPI  Follow up -hypertension  --started lisinopril 2.5 mg daily  --blood pressure is doing much better.  --continues to have tinnitus     Medications: Outpatient Medications Prior to Visit  Medication Sig   benzonatate (TESSALON) 100 MG capsule Take 1 capsule (100 mg total) by mouth 3 (three) times daily as needed.   Calcium Carb-Cholecalciferol (CALCIUM 600 + D PO) Take 1 tablet by mouth daily.   Cholecalciferol (VITAMIN D PO) Take 2,000 tablets by mouth daily.   cyanocobalamin 1000 MCG tablet Take 1,000 mcg by mouth daily.   estradiol (ESTRACE) 0.1 MG/GM vaginal cream 1 gram vaginally twice weekly   fexofenadine (ALLEGRA) 180 MG tablet Take 180 mg by mouth daily.   fluticasone (FLONASE) 50 MCG/ACT nasal spray Place 2 sprays into both nostrils daily.   lisinopril (ZESTRIL) 2.5 MG tablet Take 1 tablet (2.5 mg total) by mouth daily.   meloxicam (MOBIC) 15 MG tablet TAKE 1 TABLET(15 MG) BY MOUTH DAILY   oxybutynin (DITROPAN XL) 15 MG 24 hr tablet Take 1 tablet (15 mg total) by mouth daily.   promethazine-dextromethorphan (PROMETHAZINE-DM) 6.25-15 MG/5ML syrup Take 5 mLs by mouth 4 (four) times daily as needed.   sertraline (ZOLOFT) 50 MG tablet Take 75 mg (1.5 tablets) po QPM. May increase to 100 mg po QHS as indicated.   simvastatin (ZOCOR) 20 MG tablet TAKE 1 TABLET(20 MG) BY MOUTH DAILY AT 6 PM   Facility-Administered Medications Prior to Visit  Medication Dose Route Frequency Provider   0.9 %  sodium chloride infusion  500 mL Intravenous Once Pyrtle, Lajuan Lines, MD    Review of Systems  Constitutional:  Negative for activity change, appetite change, chills, fatigue and fever.  HENT:  Negative for congestion, postnasal drip, rhinorrhea, sinus pressure, sinus pain,  sneezing and sore throat.   Eyes: Negative.   Respiratory:  Negative for cough, chest tightness, shortness of breath and wheezing.   Cardiovascular:  Negative for chest pain and palpitations.       Improved blood pressure   Gastrointestinal:  Negative for abdominal pain, constipation, diarrhea, nausea and vomiting.  Endocrine: Negative for cold intolerance, heat intolerance, polydipsia and polyuria.  Genitourinary:  Negative for dyspareunia, dysuria, flank pain, frequency and urgency.  Musculoskeletal:  Negative for arthralgias, back pain and myalgias.  Skin:  Negative for rash.  Allergic/Immunologic: Negative for environmental allergies.  Neurological:  Negative for dizziness, weakness and headaches.  Hematological:  Negative for adenopathy.  Psychiatric/Behavioral:  The patient is not nervous/anxious.     Last CBC Lab Results  Component Value Date   WBC 4.6 01/31/2022   HGB 12.9 01/31/2022   HCT 38.5 01/31/2022   MCV 93 01/31/2022   MCH 31.2 01/31/2022   RDW 12.0 01/31/2022   PLT 253 63/78/5885   Last metabolic panel Lab Results  Component Value Date   GLUCOSE 97 01/31/2022   NA 141 01/31/2022   K 4.3 01/31/2022   CL 103 01/31/2022   CO2 23 01/31/2022   BUN 12 01/31/2022   CREATININE 0.75 01/31/2022   EGFR 92 01/31/2022   CALCIUM 8.9 01/31/2022   PROT 6.4 01/31/2022   ALBUMIN 4.5 01/31/2022   LABGLOB 1.9 01/31/2022   AGRATIO 2.4 (H) 01/31/2022   BILITOT 0.3  01/31/2022   ALKPHOS 47 01/31/2022   AST 24 01/31/2022   ALT 20 01/31/2022   Last lipids Lab Results  Component Value Date   CHOL 174 01/31/2022   HDL 60 01/31/2022   LDLCALC 97 01/31/2022   TRIG 92 01/31/2022   CHOLHDL 2.9 01/31/2022   Last hemoglobin A1c Lab Results  Component Value Date   HGBA1C 5.7 (H) 01/31/2022   Last thyroid functions Lab Results  Component Value Date   TSH 3.800 01/31/2022   Last vitamin D Lab Results  Component Value Date   VD25OH 51.9 01/31/2022       Objective      Today's Vitals   09/24/22 1542  BP: 124/82  Pulse: 76  SpO2: 95%  Weight: 164 lb 6.4 oz (74.6 kg)  Height: '5\' 3"'$  (1.6 m)   Body mass index is 29.12 kg/m.  BP Readings from Last 3 Encounters:  09/24/22 124/82  08/20/22 (Abnormal) 145/80  08/08/22 (Abnormal) 147/86    Wt Readings from Last 3 Encounters:  09/24/22 164 lb 6.4 oz (74.6 kg)  08/20/22 164 lb 12.8 oz (74.8 kg)  08/08/22 166 lb 6.4 oz (75.5 kg)    Physical Exam Vitals and nursing note reviewed.  Constitutional:      Appearance: Normal appearance. She is well-developed.  HENT:     Head: Normocephalic and atraumatic.  Eyes:     Pupils: Pupils are equal, round, and reactive to light.  Cardiovascular:     Rate and Rhythm: Normal rate and regular rhythm.     Pulses: Normal pulses.     Heart sounds: Normal heart sounds.  Pulmonary:     Effort: Pulmonary effort is normal.     Breath sounds: Normal breath sounds.  Abdominal:     Palpations: Abdomen is soft.  Musculoskeletal:        General: Normal range of motion.     Cervical back: Normal range of motion and neck supple.  Lymphadenopathy:     Cervical: No cervical adenopathy.  Skin:    General: Skin is warm and dry.     Capillary Refill: Capillary refill takes less than 2 seconds.  Neurological:     General: No focal deficit present.     Mental Status: She is alert and oriented to person, place, and time.  Psychiatric:        Mood and Affect: Mood normal.        Behavior: Behavior normal.        Thought Content: Thought content normal.        Judgment: Judgment normal.       Assessment & Plan    .1. Essential hypertension Improved and stable. No changes in blood pressure medication today, continue to monitor   2. Tinnitus of both ears Unclear etiology. Refer to ENT for further evaluation  - Ambulatory referral to ENT   Problem List Items Addressed This Visit       Cardiovascular and Mediastinum   Essential hypertension - Primary      Other   Tinnitus of both ears   Relevant Orders   Ambulatory referral to ENT     Return in about 5 months (around 02/23/2023) for health maintenance exam, FBW a week prior to visit.         Ronnell Freshwater, NP  [facility] at [facility] [phone] (phone) [phone] (fax)  [place]

## 2022-09-24 ENCOUNTER — Encounter: Payer: Self-pay | Admitting: Nurse Practitioner

## 2022-09-24 ENCOUNTER — Ambulatory Visit: Payer: BC Managed Care – PPO | Admitting: Nurse Practitioner

## 2022-09-24 VITALS — BP 124/82 | HR 76 | Ht 63.0 in | Wt 164.4 lb

## 2022-09-24 DIAGNOSIS — H9313 Tinnitus, bilateral: Secondary | ICD-10-CM

## 2022-09-24 DIAGNOSIS — I10 Essential (primary) hypertension: Secondary | ICD-10-CM

## 2022-09-27 ENCOUNTER — Telehealth: Payer: Self-pay | Admitting: *Deleted

## 2022-09-27 NOTE — Telephone Encounter (Signed)
LVM for patient to call to ask about referral, it was put in to go to [facility]. They have a location in [place] and [place] which location would she like it to go to or would she rather go to ENT in [place]. Zaul Hubers Zimmerman Rumple, CMA

## 2022-10-31 ENCOUNTER — Other Ambulatory Visit: Payer: Self-pay

## 2022-10-31 DIAGNOSIS — F32A Depression, unspecified: Secondary | ICD-10-CM

## 2022-10-31 MED ORDER — SERTRALINE HCL 50 MG PO TABS: ORAL_TABLET | ORAL | 1 refills | Status: DC

## 2022-11-02 NOTE — Progress Notes (Unsigned)
REFERRING PROVIDER: Megan Salon, MD [address],  [place] [address]  PRIMARY PROVIDER:  Ronnell Freshwater, NP  PRIMARY REASON FOR VISIT:  Encounter Diagnoses  Name Primary?   Family history of PALB2 gene mutation Yes   Family history of breast cancer    Family history of ovarian cancer    Family history of prostate cancer     HISTORY OF PRESENT ILLNESS:   Ms. Summit, a 62 y.o. female, was seen for a [place] cancer genetics consultation at the request of Dr. Sabra Heck due to a family history of known hereditary cancer mutation in the PALB2 gene.  Ms. Zinsmeister presents to clinic today to discuss the possibility of a hereditary predisposition to cancer, to discuss genetic testing, and to further clarify her future cancer risks, as well as potential cancer risks for family members.   Ms. Ramson is a 62 y.o. female with no personal history of cancer.    CANCER HISTORY:  Oncology History   No history exists.    RISK FACTORS:  Mammogram within the last year: most recent 01/2022; category b density Number of breast biopsies: 1 in late 30s/early 40s; benign per pt Colonoscopy: yes;  most recent 11/2017 . Hysterectomy: no.  Ovaries intact: yes.  Menarche was at age 76.  First live birth at age 50.  Menopausal status: postmenopausal.  OCP use for more than 10 years.  HRT use: 0 years. Dermatology screening: not currently; more than 5 years ago when lived in [place]   Past Medical History:  Diagnosis Date   Allergy    Anxiety    Arthritis    Blood in stool    Chicken pox    Depression    Frequent headaches    Hyperlipidemia    Urine incontinence     Past Surgical History:  Procedure Laterality Date   BREAST BIOPSY Left 02/2005   neg   COLONOSCOPY     Dr. Berniece Andreas[place] 2013   TONSILLECTOMY AND ADENOIDECTOMY     WRIST SURGERY Left        FAMILY HISTORY:  We obtained a detailed, 4-generation family history.  Significant diagnoses are listed  below:  Family History  Problem Relation Age of Onset   Lung cancer Father 66       smoking hx   Prostate cancer Maternal Uncle 69   Hodgkin's lymphoma Maternal Uncle 74   Prostate cancer Maternal Uncle 66       mets   Cancer Paternal Aunt        x3 pat aunts; unknown cancer; dx unknown age   Cancer Paternal Uncle        unknown cancer; dx unknown age   Breast cancer Cousin 29       bilateral; mat female cousin   Cancer - Other Cousin        PALB2 gene mutation   Breast cancer Cousin 23       triple negative; mat female cousin   Ovarian cancer Cousin 11       d. 85   Lung cancer Cousin 62       mat female cousin   Breast cancer Other        MGF's sister; d. 34s   Hodgkin's lymphoma Half-Sister 26       d. 79; pat half sister      Ms. Riegler's maternal cousin had genetic testing for 45 genes through [facility] in November 2023, which showed  a pathogenic variant in PALB2 called [id number].  She reported that her maternal half sister recently had negative genetic testing for 85 genes.  Other relatives are unavailable for genetic testing at this time.   There is no reported Ashkenazi Jewish ancestry. There is no known consanguinity.  GENETIC COUNSELING ASSESSMENT: Ms. Seanez is a 62 y.o. female with a maternal family history of a known mutation in the PALB2 gene.    DISCUSSION: We discussed that 5 - 10% of cancer is hereditary.  Given her maternal cousin has a mutation in the PALB2 gene, she has a ~12.5% chance of having the same family mutation in [place].  We reviewed the breast, ovarian, and pancreatic cancer risks and management strategies associated with PALB2 mutations.  There are other genes that can be associated with hereditary breast, prostate, and ovarian cancer syndromes.  We discussed that testing is beneficial for several reasons, including knowing about other cancer risks, identifying potential screening and risk-reduction options that may be appropriate, and to  understanding if other family members could be at risk for cancer and allowing them to undergo genetic testing.  We reviewed the characteristics, features and inheritance patterns of hereditary cancer syndromes. We also discussed genetic testing, including the appropriate family members to test, the process of testing, insurance coverage and turn-around-time for results. We discussed the implications of a negative, positive, and variant of uncertain significant result. We recommended Ms. Oberman pursue genetic testing for PALB2 as well as other genes associated with breast, ovarian, and prostate cancer, given that her maternal uncle with metastatic prostate cancer has not had genetic testing.   We discussed that she does qualify for [facility] no charge testing for the PALB2 family mutation only but this testing would not be comprehensive given her reported family history.   The CancerNext-Expanded gene panel offered by [facility] and includes sequencing, rearrangement, and RNA analysis for the following 77 genes: AIP, ALK, APC, ATM, AXIN2, BAP1, BARD1, BLM, BMPR1A, BRCA1, BRCA2, BRIP1, CDC73, CDH1, CDK4, CDKN1B, CDKN2A, CHEK2, CTNNA1, DICER1, FANCC, FH, FLCN, GALNT12, KIF1B, LZTR1, MAX, MEN1, MET, MLH1, MSH2, MSH3, MSH6, MUTYH, NBN, NF1, NF2, NTHL1, PALB2, PHOX2B, PMS2, POT1, PRKAR1A, PTCH1, PTEN, RAD51C, RAD51D, RB1, RECQL, RET, SDHA, SDHAF2, SDHB, SDHC, SDHD, SMAD4, SMARCA4, SMARCB1, SMARCE1, STK11, SUFU, TMEM127, TP53, TSC1, TSC2, VHL and XRCC2 (sequencing and deletion/duplication); EGFR, EGLN1, HOXB13, KIT, MITF, PDGFRA, POLD1, and POLE (sequencing only); EPCAM and GREM1 (deletion/duplication only).   Based on Ms. Poulsen's family history of cancer and known PALB2 mutation, she meets medical criteria for genetic testing. Despite that she meets criteria, she may still have an out of pocket cost. We discussed that if her out of pocket cost for testing is over $100, the laboratory should contact them to  discuss self-pay options and/or patient pay assistance programs.   We discussed the Genetic Information Non-Discrimination Act (GINA) of 2008, which helps protect individuals against genetic discrimination based on their genetic test results.  It impacts both health insurance and employment.  With health insurance, it protects against genetic test results being used for increased premiums or policy termination. For employment, it protects against hiring, firing and promoting decisions based on genetic test results.  GINA does not apply to those in the [employer], those who work for companies with less than 15 employees, and new life insurance or long-term disability insurance policies.  Health status due to a cancer diagnosis is not protected under GINA.  PLAN: After considering the risks, benefits, and limitations, Ms. Gelber provided  informed consent to pursue genetic testing and the blood sample was sent to [facility] for analysis of the CancerNext-Expanded +RNAinsight Panel. Results should be available within approximately 3 weeks' time, at which point they will be disclosed by telephone to Ms. Dimiceli, as will any additional recommendations warranted by these results. Ms. Fabila will receive a summary of her genetic counseling visit and a copy of her results once available. This information will also be available in Epic.   Ms. Blacketer questions were answered to her satisfaction today. Our contact information was provided should additional questions or concerns arise. Thank you for the referral and allowing [place] to share in the care of your patient.   Trenesha Alcaide M. Joette Catching, [place], [facility] Genetic Counselor [email or web address] (P) [phone]   The patient was seen for a total of 30 minutes in face-to-face genetic counseling.  The patient was seen alone.  Drs. Lindi Adie and/or Burr Medico were available to discuss this case as needed.   _______________________________________________________________________ For Office Staff:  Number of people involved in session: 1 Was an Intern/ student involved with case: no

## 2022-11-05 ENCOUNTER — Encounter: Payer: Self-pay | Admitting: Genetic Counselor

## 2022-11-05 ENCOUNTER — Inpatient Hospital Stay: Payer: BC Managed Care – PPO | Attending: Genetic Counselor | Admitting: Genetic Counselor

## 2022-11-05 ENCOUNTER — Inpatient Hospital Stay: Payer: BC Managed Care – PPO

## 2022-11-05 ENCOUNTER — Other Ambulatory Visit: Payer: Self-pay

## 2022-11-05 DIAGNOSIS — Z803 Family history of malignant neoplasm of breast: Secondary | ICD-10-CM

## 2022-11-05 DIAGNOSIS — Z8481 Family history of carrier of genetic disease: Secondary | ICD-10-CM | POA: Diagnosis not present

## 2022-11-05 DIAGNOSIS — Z8042 Family history of malignant neoplasm of prostate: Secondary | ICD-10-CM

## 2022-11-05 DIAGNOSIS — J3489 Other specified disorders of nose and nasal sinuses: Secondary | ICD-10-CM | POA: Diagnosis not present

## 2022-11-05 DIAGNOSIS — H9313 Tinnitus, bilateral: Secondary | ICD-10-CM | POA: Diagnosis not present

## 2022-11-05 DIAGNOSIS — Z8041 Family history of malignant neoplasm of ovary: Secondary | ICD-10-CM

## 2022-11-05 DIAGNOSIS — H6983 Other specified disorders of Eustachian tube, bilateral: Secondary | ICD-10-CM | POA: Diagnosis not present

## 2022-11-05 LAB — GENETIC SCREENING ORDER

## 2022-11-12 ENCOUNTER — Other Ambulatory Visit: Payer: Self-pay | Admitting: Nurse Practitioner

## 2022-11-12 DIAGNOSIS — I10 Essential (primary) hypertension: Secondary | ICD-10-CM

## 2022-11-22 ENCOUNTER — Encounter: Payer: Self-pay | Admitting: Genetic Counselor

## 2022-11-22 ENCOUNTER — Ambulatory Visit: Payer: Self-pay | Admitting: Genetic Counselor

## 2022-11-22 ENCOUNTER — Telehealth: Payer: Self-pay | Admitting: Genetic Counselor

## 2022-11-22 DIAGNOSIS — Z8041 Family history of malignant neoplasm of ovary: Secondary | ICD-10-CM

## 2022-11-22 DIAGNOSIS — Z1379 Encounter for other screening for genetic and chromosomal anomalies: Secondary | ICD-10-CM | POA: Insufficient documentation

## 2022-11-22 DIAGNOSIS — Z803 Family history of malignant neoplasm of breast: Secondary | ICD-10-CM

## 2022-11-22 DIAGNOSIS — Z8481 Family history of carrier of genetic disease: Secondary | ICD-10-CM

## 2022-11-22 DIAGNOSIS — Z8042 Family history of malignant neoplasm of prostate: Secondary | ICD-10-CM

## 2022-11-22 NOTE — Telephone Encounter (Signed)
Contacted patient in attempt to disclose results of genetic testing.  LVM with contact information requesting a call back.

## 2022-11-22 NOTE — Telephone Encounter (Signed)
Revealed negative genetics.  PALB2 mutation that was previously detected in her mat cousins was not detected in her sample.

## 2022-11-22 NOTE — Progress Notes (Signed)
HPI:   Pamela Francis was previously seen in the [facility] clinic due to a family history of breast cancer and a family history of a PALB2 mutation in her maternal cousin.  Please refer to our prior cancer genetics clinic note for more information regarding our discussion, assessment and recommendations, at the time. Pamela Francis recent genetic test results were disclosed to her, as were recommendations warranted by these results. These results and recommendations are discussed in more detail below.  CANCER HISTORY:  Pamela Francis is a 62 y.o. female with no personal history of cancer.      FAMILY HISTORY:  We obtained a detailed, 4-generation family history.  Significant diagnoses are listed below:        Family History  Problem Relation Age of Onset   Lung cancer Father 41        smoking hx   Prostate cancer Maternal Uncle 69   Hodgkin's lymphoma Maternal Uncle 74   Prostate cancer Maternal Uncle 66        mets   Cancer Paternal Aunt          x3 pat aunts; unknown cancer; dx unknown age   Cancer Paternal Uncle          unknown cancer; dx unknown age   Breast cancer Cousin 59        bilateral; mat female cousin   Cancer - Other Cousin          PALB2 gene mutation   Breast cancer Cousin 87        triple negative; mat female cousin   Ovarian cancer Cousin 11        d. 52   Lung cancer Cousin 73        mat female cousin   Breast cancer Other          MGF's sister; d. 26s   Hodgkin's lymphoma Half-Sister 47        d. 68; pat half sister              Pamela Francis maternal cousin had genetic testing for 53 genes through [facility] in November 2023, which showed a pathogenic variant in PALB2 called [id number].  She reported that her maternal half sister recently had negative genetic testing for 85 genes.  Other relatives are unavailable for genetic testing at this time.    There is no reported Ashkenazi Jewish ancestry. There is no known consanguinity.     GENETIC TEST RESULTS:  The [facility] CancerNext-Expanded +RNAinsight Panel found no pathogenic mutations.   The CancerNext-Expanded gene panel offered by [facility] and includes sequencing, rearrangement, and RNA analysis for the following 71 genes:  AIP, ALK, APC, ATM, BAP1, BARD1, BMPR1A, BRCA1, BRCA2, BRIP1, CDC73, CDH1, CDK4, CDKN1B, CDKN2A, CHEK2, DICER1, FH, FLCN, KIF1B, LZTR1,MAX, MEN1, MET, MLH1, MSH2, MSH6, MUTYH, NF1, NF2, NTHL1, PALB2, PHOX2B, PMS2, POT1, PRKAR1A, PTCH1, PTEN, RAD51C,RAD51D, RB1, RET, SDHA, SDHAF2, SDHB, SDHC, SDHD, SMAD4, SMARCA4, SMARCB1, SMARCE1, STK11, SUFU, TMEM127, TP53,TSC1, TSC2 and VHL (sequencing and deletion/duplication); AXIN2, CTNNA1, EGFR, EGLN1, HOXB13, KIT, MITF, MSH3, PDGFRA, POLD1 and POLE (sequencing only); EPCAM and GREM1 (deletion/duplication only).   The test report has been scanned into EPIC and is located under the Molecular Pathology section of the Results Review tab.  A portion of the result report is included below for reference. Genetic testing reported out on November 21, 2022.      We recommended Pamela Francis pursue testing for the familial hereditary cancer gene  mutation called  PALB2 [id number] ([id number]), which was previously detected in her maternal couin. Pamela Francis test was normal and did not reveal the familial mutation. We call this result a true negative result because the cancer-causing mutation was identified in Pamela Francis's family, and she did not inherit it.  Given this negative result, Pamela Francis chances of developing PALB2-related cancers are about the same as they are in the general population.    ADDITIONAL GENETIC TESTING:  We discussed with Pamela Francis that her genetic testing was fairly extensive.  If there are additional relevant genes identified to increase cancer risk that can be analyzed in the future, we would be happy to discuss and coordinate this testing at that time.     CANCER SCREENING RECOMMENDATIONS:   Pamela Francis test result is considered negative (normal).  She was negative for the family variant in Pamela Francis; thus, high risk PALB2-related screening is not indicated for Pamela Francis based on this result.   An individual's cancer risk and medical management are not determined by genetic test results alone. Overall cancer risk assessment incorporates additional factors, including personal medical history, family history, and any available genetic information that may result in a personalized plan for cancer prevention and surveillance. Therefore, it is recommended she continue to follow the cancer management and screening guidelines provided by her primary healthcare provider.  RECOMMENDATIONS FOR FAMILY MEMBERS:   Since she did not inherit a identifiable mutation in a cancer predisposition gene included on this panel, her children could not have inherited a known mutation from her in one of these genes. Other members of the family may still carry a pathogenic variant in one of these genes that Pamela Francis did not inherit. Based on the family history of a PALB2 mutation in her maternal cousin, we recommend her maternal relatives have genetic counseling and testing. Pamela Francis will let us know if we can be of any assistance in coordinating genetic counseling and/or testing for these family members.     FOLLOW-UP:  Lastly, we discussed with Pamela Francis that cancer genetics is a rapidly advancing field and it is possible that new genetic tests will be appropriate for her and/or her family members in the future. We encouraged her to remain in contact with cancer genetics on an annual basis so we can update her personal and family histories and let her know of advances in cancer genetics that may benefit this family.   Our contact number was provided. Pamela Francis questions were answered to her satisfaction, and she knows she is welcome to call us at anytime with additional questions or concerns.   Tommaso Cavitt M.  Joette Catching, [place], [facility] Genetic Counselor Pamela [email or web address] (P) [phone]

## 2022-11-26 ENCOUNTER — Other Ambulatory Visit (HOSPITAL_COMMUNITY)
Admission: RE | Admit: 2022-11-26 | Discharge: 2022-11-26 | Disposition: A | Discharge status: Home / Self Care | Payer: BC Managed Care – PPO | Source: Ambulatory Visit | Attending: Obstetrics & Gynecology | Admitting: Obstetrics & Gynecology

## 2022-11-26 ENCOUNTER — Encounter (HOSPITAL_BASED_OUTPATIENT_CLINIC_OR_DEPARTMENT_OTHER): Payer: Self-pay | Admitting: Obstetrics & Gynecology

## 2022-11-26 ENCOUNTER — Ambulatory Visit (HOSPITAL_BASED_OUTPATIENT_CLINIC_OR_DEPARTMENT_OTHER): Payer: BC Managed Care – PPO | Admitting: Obstetrics & Gynecology

## 2022-11-26 ENCOUNTER — Ambulatory Visit: Payer: PRIVATE HEALTH INSURANCE | Admitting: Podiatry

## 2022-11-26 VITALS — BP 148/84 | HR 67 | Ht 63.0 in | Wt 169.0 lb

## 2022-11-26 DIAGNOSIS — N72 Inflammatory disease of cervix uteri: Secondary | ICD-10-CM | POA: Diagnosis not present

## 2022-11-26 DIAGNOSIS — I10 Essential (primary) hypertension: Secondary | ICD-10-CM | POA: Diagnosis not present

## 2022-11-26 DIAGNOSIS — N870 Mild cervical dysplasia: Secondary | ICD-10-CM

## 2022-11-26 DIAGNOSIS — B977 Papillomavirus as the cause of diseases classified elsewhere: Secondary | ICD-10-CM | POA: Diagnosis not present

## 2022-11-26 NOTE — Progress Notes (Signed)
62 y.o. G1P1 Married White or Caucasian female here for follow up pap smear.  H/O LEEP in September.  Follow up pap 3 montyhs later was sill abnormal.  Denies vaginal bleeding or vaginal discharge.  She has vaginal estrogen cream and is using this intermittently.    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  Colposcopy with biopsy 8/22 with high grad echanges.  LEEP 9/25 showed cin 1 only.  ECC above leep was negative.  First follow up pap was done in 3 months after LEEP.  Unrelated, blood pressure is elevated today.  Pt usually takes medication at night but did not last night.  No LMP recorded (lmp unknown). Patient is postmenopausal.            reports that she has never smoked. She has never used smokeless tobacco. She reports current alcohol use. She reports that she does not use drugs.  Past Medical History:  Diagnosis Date   Allergy    Anxiety    Arthritis    Blood in stool    Chicken pox    Depression    Frequent headaches    Hyperlipidemia    Urine incontinence     Past Surgical History:  Procedure Laterality Date   BREAST BIOPSY Left 02/2005   neg   COLONOSCOPY     Dr. Berniece Andreas[place] 2013   TONSILLECTOMY AND ADENOIDECTOMY     WRIST SURGERY Left     Medications:  reviewed   Family History  Problem Relation Age of Onset   Arthritis Mother    Hyperlipidemia Mother    Hypertension Mother    Colon polyps Mother    Lung cancer Father 57       smoking hx   Prostate cancer Maternal Uncle 13   Hodgkin's lymphoma Maternal Uncle 74   Prostate cancer Maternal Uncle 66       mets   Cancer Paternal Aunt        x3 pat aunts; unknown cancer; dx unknown age   Cancer Paternal Uncle        unknown cancer; dx unknown age   Rheum arthritis Maternal Grandmother    Stroke Maternal Grandmother    Alcohol abuse Maternal Grandfather    Breast cancer Cousin 27       bilateral; mat female cousin   Cancer - Other Cousin        PALB2 gene mutation   Breast cancer Cousin  38       triple negative; mat female cousin   Ovarian cancer Cousin 11       d. 42   Lung cancer Cousin 93       mat female cousin   Breast cancer Other        MGF's sister; d. 69s   Hodgkin's lymphoma Half-Sister 79       d. 14; pat half sister   Colon cancer Neg Hx    Esophageal cancer Neg Hx    Pancreatic cancer Neg Hx    Stomach cancer Neg Hx    Rectal cancer Neg Hx    Liver cancer Neg Hx     ROS: Constitutional: negative Genitourinary:negative  Exam:   BP (!) 148/84   Pulse 67   Ht '5\' 3"'$  (1.6 m) Comment: Reported  Wt 169 lb (76.7 kg)   LMP  (LMP Unknown)   BMI 29.94 kg/m   Height: '5\' 3"'$  (160 cm) (Reported)  General appearance: alert, cooperative and appears stated age  Neurologic: Grossly normal   Pelvic: External genitalia:  no lesions              Urethra:  normal appearing urethra with no masses, tenderness or lesions              Bartholins and Skenes: normal                 Vagina: normal appearing vagina with normal color and no discharge, no lesions              Cervix: no lesions              Pap taken: Yes.    Chaperone, Octaviano Batty, CMA, was present for exam.  Assessment/Plan: 1. Dysplasia of cervix, low grade (CIN 1) - Cytology - PAP( [facility])  2. High risk human papilloma virus (HPV) infection of cervix  3. Elevated blood pressure reading in office with diagnosis of hypertension - pt did not take her medication last night.  Aware of importance.

## 2022-11-29 LAB — CYTOLOGY - PAP: Diagnosis: NEGATIVE

## 2022-11-30 ENCOUNTER — Other Ambulatory Visit: Payer: Self-pay | Admitting: Nurse Practitioner

## 2022-11-30 DIAGNOSIS — G8929 Other chronic pain: Secondary | ICD-10-CM

## 2022-12-03 ENCOUNTER — Encounter: Payer: Self-pay | Admitting: Podiatry

## 2022-12-03 ENCOUNTER — Ambulatory Visit: Payer: BC Managed Care – PPO | Admitting: Podiatry

## 2022-12-03 DIAGNOSIS — M7741 Metatarsalgia, right foot: Secondary | ICD-10-CM

## 2022-12-03 DIAGNOSIS — M7742 Metatarsalgia, left foot: Secondary | ICD-10-CM

## 2022-12-03 DIAGNOSIS — J301 Allergic rhinitis due to pollen: Secondary | ICD-10-CM | POA: Diagnosis not present

## 2022-12-03 DIAGNOSIS — M2141 Flat foot [pes planus] (acquired), right foot: Secondary | ICD-10-CM | POA: Diagnosis not present

## 2022-12-03 DIAGNOSIS — K219 Gastro-esophageal reflux disease without esophagitis: Secondary | ICD-10-CM | POA: Diagnosis not present

## 2022-12-03 DIAGNOSIS — R0982 Postnasal drip: Secondary | ICD-10-CM | POA: Diagnosis not present

## 2022-12-03 DIAGNOSIS — J309 Allergic rhinitis, unspecified: Secondary | ICD-10-CM | POA: Diagnosis not present

## 2022-12-03 DIAGNOSIS — M25872 Other specified joint disorders, left ankle and foot: Secondary | ICD-10-CM

## 2022-12-03 DIAGNOSIS — M2142 Flat foot [pes planus] (acquired), left foot: Secondary | ICD-10-CM | POA: Diagnosis not present

## 2022-12-03 NOTE — Progress Notes (Signed)
  Subjective:  Patient ID: Pamela Francis, female    DOB: 06-18-1961,  MRN: [id number]  Chief Complaint  Patient presents with   Foot Orthotics    bil foot pain esp after working all day. interested in orthotics    62 y.o. female presents with the above complaint. History confirmed with patient.  She returns for follow-up the pain under the toe joint is less intense but now spread across the entire ball of the foot and her feet feel fatigued and painful by the end of the day she works as a head resting on her feet.  Objective:  Physical Exam: warm, good capillary refill, no trophic changes or ulcerative lesions, normal DP and PT pulses, and normal sensory exam. Both feet have pain and tenderness in the distal forefoot and the metatarsal area, prominence of the first metatarsal with forefoot valgus.  Assessment:   1. Sesamoiditis of left foot   2. Pes planus of both feet   3. Metatarsalgia of both feet      Plan:  Patient was evaluated and treated and all questions answered.  We discussed further treatment, she has made shoe gear changes and has not improved with anti-inflammatories.  I recommend custom orthoses to further offload the painful areas first ray cut out, unload on the first metatarsal submetatarsal area, and metatarsal pad to alleviate this further.  No follow-ups on file.

## 2022-12-31 NOTE — Addendum Note (Signed)
Addended by: Jaysun Wessels R on: 12/31/2022 07:06 AM   Modules accepted: Orders  

## 2023-01-01 ENCOUNTER — Telehealth: Payer: Self-pay | Admitting: Podiatry

## 2023-01-01 NOTE — Telephone Encounter (Signed)
Lmom to schedule appt to pick up - orthotics currently in [place] will send to [facility] when scheduled   Balance is 45.76

## 2023-01-30 ENCOUNTER — Other Ambulatory Visit: Payer: Self-pay | Admitting: Nurse Practitioner

## 2023-01-30 DIAGNOSIS — I10 Essential (primary) hypertension: Secondary | ICD-10-CM

## 2023-01-30 DIAGNOSIS — E782 Mixed hyperlipidemia: Secondary | ICD-10-CM

## 2023-01-30 DIAGNOSIS — Z0000 Encounter for general adult medical examination without abnormal findings: Secondary | ICD-10-CM

## 2023-01-30 DIAGNOSIS — Z130 Encounter for screening for diseases of the blood and blood-forming organs and certain disorders involving the immune mechanism: Secondary | ICD-10-CM

## 2023-02-04 DIAGNOSIS — K219 Gastro-esophageal reflux disease without esophagitis: Secondary | ICD-10-CM | POA: Diagnosis not present

## 2023-02-04 DIAGNOSIS — J309 Allergic rhinitis, unspecified: Secondary | ICD-10-CM | POA: Diagnosis not present

## 2023-02-08 ENCOUNTER — Ambulatory Visit: Payer: BC Managed Care – PPO

## 2023-02-08 DIAGNOSIS — M25872 Other specified joint disorders, left ankle and foot: Secondary | ICD-10-CM

## 2023-02-08 DIAGNOSIS — M2141 Flat foot [pes planus] (acquired), right foot: Secondary | ICD-10-CM

## 2023-02-08 DIAGNOSIS — M7741 Metatarsalgia, right foot: Secondary | ICD-10-CM

## 2023-02-08 NOTE — Progress Notes (Unsigned)
Patient presents today to pick up custom molded foot orthotics recommended by Dr. MCDONALD.   Orthotics were dispensed and fit was satisfactory. Reviewed instructions for break-in and wear. Written instructions given to patient.  Patient will follow up as needed.   

## 2023-02-18 ENCOUNTER — Ambulatory Visit (HOSPITAL_BASED_OUTPATIENT_CLINIC_OR_DEPARTMENT_OTHER): Payer: BC Managed Care – PPO | Admitting: Obstetrics & Gynecology

## 2023-02-22 ENCOUNTER — Other Ambulatory Visit: Payer: BC Managed Care – PPO

## 2023-02-25 ENCOUNTER — Other Ambulatory Visit: Payer: Self-pay | Admitting: Nurse Practitioner

## 2023-02-25 DIAGNOSIS — I10 Essential (primary) hypertension: Secondary | ICD-10-CM

## 2023-03-03 NOTE — Progress Notes (Unsigned)
Complete physical exam   Patient: Pamela Francis   DOB: 02-Jun-1961   62 y.o. Female  MRN: [id number] Visit Date: 03/04/2023    No chief complaint on file.  Subjective    Kishara Loveland is a 62 y.o. female who presents today for a complete physical exam.  She reports consuming a {diet types:17450} diet. {Exercise:19826} She generally feels {well/fairly well/poorly:18703}. She {does/does not:200015} have additional problems to discuss today.   HPI  Annual physical  -HTN --generally well controlled -due for routine, fasting labs.   Past Medical History:  Diagnosis Date   Allergy    Anxiety    Arthritis    Blood in stool    Chicken pox    Depression    Frequent headaches    Hyperlipidemia    Urine incontinence    Past Surgical History:  Procedure Laterality Date   BREAST BIOPSY Left 02/2005   neg   COLONOSCOPY     Dr. Alba Destine, [place] 2013   TONSILLECTOMY AND ADENOIDECTOMY     WRIST SURGERY Left    Social History   Socioeconomic History   Marital status: Married    Spouse name: Not on file   Number of children: Not on file   Years of education: Not on file   Highest education level: Not on file  Occupational History   Not on file  Tobacco Use   Smoking status: Never   Smokeless tobacco: Never  Vaping Use   Vaping Use: Never used  Substance and Sexual Activity   Alcohol use: Yes    Alcohol/week: 0.0 standard drinks of alcohol    Comment: occasional   Drug use: No   Sexual activity: Yes  Other Topics Concern   Not on file  Social History Narrative   Not on file   Social Determinants of Health   Financial Resource Strain: Not on file  Food Insecurity: Not on file  Transportation Needs: Not on file  Physical Activity: Not on file  Stress: Not on file  Social Connections: Not on file  Intimate Partner Violence: Not on file   Family Status  Relation Name Status   Mother  Alive   Father  Deceased   Mat Odette Horns Deceased   Mat Uncle  Richard Deceased   Oceanographer  (Not Specified)   Oneal Grout  (Not Specified)   MGM  (Not Specified)   MGF  (Not Specified)   Cousin Darl Pikes Alive   Cousin Stacie Alive   Cousin Kristin Deceased   Cousin  Alive   Other  (Not Specified)   H Sister Carollee Herter Deceased   Neg Hx  (Not Specified)   Family History  Problem Relation Age of Onset   Arthritis Mother    Hyperlipidemia Mother    Hypertension Mother    Colon polyps Mother    Lung cancer Father 63       smoking hx   Prostate cancer Maternal Uncle 2   Hodgkin's lymphoma Maternal Uncle 74   Prostate cancer Maternal Uncle 60       mets   Cancer Paternal Aunt        x3 pat aunts; unknown cancer; dx unknown age   Cancer Paternal Uncle        unknown cancer; dx unknown age   Rheum arthritis Maternal Grandmother    Stroke Maternal Grandmother    Alcohol abuse Maternal Grandfather    Breast cancer Cousin 50       bilateral; mat female  cousin   Cancer - Other Cousin        PALB2 gene mutation   Breast cancer Cousin 3       triple negative; mat female cousin   Ovarian cancer Cousin 11       d. 75   Lung cancer Cousin 11       mat female cousin   Breast cancer Other        MGF's sister; d. 9s   Hodgkin's lymphoma Half-Sister 61       d. 43; pat half sister   Colon cancer Neg Hx    Esophageal cancer Neg Hx    Pancreatic cancer Neg Hx    Stomach cancer Neg Hx    Rectal cancer Neg Hx    Liver cancer Neg Hx    No Known Allergies  Patient Care Team: Carlean Jews, NP as PCP - General (Family Medicine)   Medications: Outpatient Medications Prior to Visit  Medication Sig   Calcium Carb-Cholecalciferol (CALCIUM 600 + D PO) Take 1 tablet by mouth daily.   Cholecalciferol (VITAMIN D PO) Take 2,000 tablets by mouth daily.   cyanocobalamin 1000 MCG tablet Take 1,000 mcg by mouth daily.   estradiol (ESTRACE) 0.1 MG/GM vaginal cream 1 gram vaginally twice weekly   fexofenadine (ALLEGRA) 180 MG tablet Take 180 mg by mouth daily.    fluticasone (FLONASE) 50 MCG/ACT nasal spray Place 2 sprays into both nostrils daily.   lisinopril (ZESTRIL) 2.5 MG tablet TAKE 1 TABLET(2.5 MG) BY MOUTH DAILY   meloxicam (MOBIC) 15 MG tablet TAKE 1 TABLET(15 MG) BY MOUTH DAILY   oxybutynin (DITROPAN XL) 15 MG 24 hr tablet Take 1 tablet (15 mg total) by mouth daily.   sertraline (ZOLOFT) 50 MG tablet Take 75 mg (1.5 tablets) po QPM. May increase to 100 mg po QHS as indicated.   simvastatin (ZOCOR) 20 MG tablet TAKE 1 TABLET(20 MG) BY MOUTH DAILY AT 6 PM   Facility-Administered Medications Prior to Visit  Medication Dose Route Frequency Provider   0.9 %  sodium chloride infusion  500 mL Intravenous Once Beverley Fiedler, MD    Review of Systems  Last CBC Lab Results  Component Value Date   WBC 4.6 01/31/2022   HGB 12.9 01/31/2022   HCT 38.5 01/31/2022   MCV 93 01/31/2022   MCH 31.2 01/31/2022   RDW 12.0 01/31/2022   PLT 253 01/31/2022   Last metabolic panel Lab Results  Component Value Date   GLUCOSE 97 01/31/2022   NA 141 01/31/2022   K 4.3 01/31/2022   CL 103 01/31/2022   CO2 23 01/31/2022   BUN 12 01/31/2022   CREATININE 0.75 01/31/2022   EGFR 92 01/31/2022   CALCIUM 8.9 01/31/2022   PROT 6.4 01/31/2022   ALBUMIN 4.5 01/31/2022   LABGLOB 1.9 01/31/2022   AGRATIO 2.4 (H) 01/31/2022   BILITOT 0.3 01/31/2022   ALKPHOS 47 01/31/2022   AST 24 01/31/2022   ALT 20 01/31/2022   Last lipids Lab Results  Component Value Date   CHOL 174 01/31/2022   HDL 60 01/31/2022   LDLCALC 97 01/31/2022   TRIG 92 01/31/2022   CHOLHDL 2.9 01/31/2022   Last hemoglobin A1c Lab Results  Component Value Date   HGBA1C 5.7 (H) 01/31/2022   Last thyroid functions Lab Results  Component Value Date   TSH 3.800 01/31/2022   Last vitamin D Lab Results  Component Value Date   VD25OH 51.9 01/31/2022  Objective    There were no vitals filed for this visit. There is no height or weight on file to calculate BMI.  BP  Readings from Last 3 Encounters:  11/26/22 (Abnormal) 148/84  09/24/22 124/82  08/20/22 (Abnormal) 145/80    Wt Readings from Last 3 Encounters:  11/26/22 169 lb (76.7 kg)  09/24/22 164 lb 6.4 oz (74.6 kg)  08/20/22 164 lb 12.8 oz (74.8 kg)     Physical Exam  ***  Last depression screening scores   Row Labels 11/26/2022    8:52 AM 09/24/2022    3:45 PM 08/20/2022   11:20 AM  PHQ 2/9 Scores   Section Header. No data exists in this row.     PHQ - 2 Score   0 0 0  PHQ- 9 Score    4    Last fall risk screening   Row Labels 12/25/2021    3:42 PM  Fall Risk    Section Header. No data exists in this row.   Falls in the past year?   0  Number falls in past yr:   0  Injury with Fall?   0  Follow up   Falls evaluation completed   Last Audit-C alcohol use screening   No data to display    A score of 3 or more in women, and 4 or more in men indicates increased risk for alcohol abuse, EXCEPT if all of the points are from question 1   No results found for any visits on 03/04/23.  Assessment & Plan    Routine Health Maintenance and Physical Exam  Exercise Activities and Dietary recommendations  Goals   None     Immunization History  Administered Date(s) Administered   Influenza,inj,Quad PF,6+ Mos 06/18/2018, 07/06/2022   Influenza-Unspecified 07/07/2020   Moderna Sars-Covid-2 Vaccination 09/28/2019, 10/26/2019, 07/29/2020   Td 11/03/2018   Tdap 01/16/2008   Zoster Recombinat (Shingrix) 02/07/2022, 08/08/2022    Health Maintenance  Topic Date Due   COVID-19 Vaccine (4 - 2023-24 season) 05/18/2022   INFLUENZA VACCINE  04/18/2023   MAMMOGRAM  02/14/2024   PAP SMEAR-Modifier  11/25/2025   Colonoscopy  12/05/2027   DTaP/Tdap/Td (3 - Td or Tdap) 11/03/2028   Hepatitis C Screening  Completed   HIV Screening  Completed   Zoster Vaccines- Shingrix  Completed   HPV VACCINES  Aged Out    Discussed health benefits of physical activity, and encouraged her to engage in  regular exercise appropriate for her age and condition.  There are no diagnoses linked to this encounter.  No follow-ups on file.        Carlean Jews, NP  [facility] [phone] (phone) [phone] (fax)  [facility]

## 2023-03-04 ENCOUNTER — Encounter: Payer: Self-pay | Admitting: Nurse Practitioner

## 2023-03-04 ENCOUNTER — Ambulatory Visit (INDEPENDENT_AMBULATORY_CARE_PROVIDER_SITE_OTHER): Payer: BC Managed Care – PPO | Admitting: Nurse Practitioner

## 2023-03-04 VITALS — BP 123/80 | HR 62 | Ht 63.0 in | Wt 164.0 lb

## 2023-03-04 DIAGNOSIS — Z0001 Encounter for general adult medical examination with abnormal findings: Secondary | ICD-10-CM | POA: Diagnosis not present

## 2023-03-04 DIAGNOSIS — I10 Essential (primary) hypertension: Secondary | ICD-10-CM

## 2023-03-04 DIAGNOSIS — E559 Vitamin D deficiency, unspecified: Secondary | ICD-10-CM | POA: Insufficient documentation

## 2023-03-04 DIAGNOSIS — R7301 Impaired fasting glucose: Secondary | ICD-10-CM | POA: Diagnosis not present

## 2023-03-04 DIAGNOSIS — G44209 Tension-type headache, unspecified, not intractable: Secondary | ICD-10-CM | POA: Insufficient documentation

## 2023-03-04 DIAGNOSIS — R5383 Other fatigue: Secondary | ICD-10-CM

## 2023-03-04 DIAGNOSIS — E782 Mixed hyperlipidemia: Secondary | ICD-10-CM

## 2023-03-04 MED ORDER — TIZANIDINE HCL 4 MG PO TABS
2.0000 mg | ORAL_TABLET | Freq: Four times a day (QID) | ORAL | 2 refills | Status: DC | PRN
Start: 2023-03-04 — End: 2023-06-05

## 2023-03-04 NOTE — Assessment & Plan Note (Signed)
Trial tizanidine 2-4 mg at bedtime as needed.  -take tylenol as needed and as indicated for acute headaches

## 2023-03-04 NOTE — Assessment & Plan Note (Signed)
Check vitamin d level and treat deficiency as indicated.   

## 2023-03-04 NOTE — Assessment & Plan Note (Signed)
Check HgbA1c with labs today 

## 2023-03-04 NOTE — Assessment & Plan Note (Signed)
Check fasting lipid panel today.  -adjust simvastatin as indicated

## 2023-03-04 NOTE — Assessment & Plan Note (Signed)
Slightly elevated today though generally stable -conitnue BP medication as prescribed

## 2023-03-04 NOTE — Assessment & Plan Note (Signed)
Check labs for further evaluation.  

## 2023-03-05 LAB — CBC
Hematocrit: 37.9 % (ref 34.0–46.6)
Hemoglobin: 12.8 g/dL (ref 11.1–15.9)
MCH: 31 pg (ref 26.6–33.0)
MCHC: 33.8 g/dL (ref 31.5–35.7)
MCV: 92 fL (ref 79–97)
Platelets: 254 10*3/uL (ref 150–450)
RBC: 4.13 x10E6/uL (ref 3.77–5.28)
RDW: 12 % (ref 11.7–15.4)
WBC: 4.6 10*3/uL (ref 3.4–10.8)

## 2023-03-05 LAB — COMPREHENSIVE METABOLIC PANEL
ALT: 22 IU/L (ref 0–32)
AST: 33 IU/L (ref 0–40)
Albumin: 4.6 g/dL (ref 3.8–4.9)
Alkaline Phosphatase: 52 IU/L (ref 44–121)
BUN/Creatinine Ratio: 11 — ABNORMAL LOW (ref 12–28)
BUN: 9 mg/dL (ref 8–27)
Bilirubin Total: 0.5 mg/dL (ref 0.0–1.2)
CO2: 25 mmol/L (ref 20–29)
Calcium: 9.2 mg/dL (ref 8.7–10.3)
Chloride: 105 mmol/L (ref 96–106)
Creatinine, Ser: 0.83 mg/dL (ref 0.57–1.00)
Globulin, Total: 2.1 g/dL (ref 1.5–4.5)
Glucose: 92 mg/dL (ref 70–99)
Potassium: 4.4 mmol/L (ref 3.5–5.2)
Sodium: 142 mmol/L (ref 134–144)
Total Protein: 6.7 g/dL (ref 6.0–8.5)
eGFR: 81 mL/min/{1.73_m2} (ref >59)

## 2023-03-05 LAB — LIPID PANEL
Chol/HDL Ratio: 3 ratio (ref 0.0–4.4)
Cholesterol, Total: 178 mg/dL (ref 100–199)
HDL: 60 mg/dL (ref >39)
LDL Chol Calc (NIH): 100 mg/dL — ABNORMAL HIGH (ref 0–99)
Triglycerides: 102 mg/dL (ref 0–149)
VLDL Cholesterol Cal: 18 mg/dL (ref 5–40)

## 2023-03-05 LAB — TSH+FREE T4
Free T4: 1.08 ng/dL (ref 0.82–1.77)
TSH: 2.69 u[IU]/mL (ref 0.450–4.500)

## 2023-03-05 LAB — HEMOGLOBIN A1C
Est. average glucose Bld gHb Est-mCnc: 120 mg/dL
Hgb A1c MFr Bld: 5.8 % — ABNORMAL HIGH (ref 4.8–5.6)

## 2023-03-05 LAB — VITAMIN D 25 HYDROXY (VIT D DEFICIENCY, FRACTURES): Vit D, 25-Hydroxy: 49.6 ng/mL (ref 30.0–100.0)

## 2023-03-21 ENCOUNTER — Other Ambulatory Visit: Payer: Self-pay | Admitting: Nurse Practitioner

## 2023-03-21 DIAGNOSIS — E782 Mixed hyperlipidemia: Secondary | ICD-10-CM

## 2023-03-22 ENCOUNTER — Other Ambulatory Visit: Payer: Self-pay

## 2023-03-22 DIAGNOSIS — N393 Stress incontinence (female) (male): Secondary | ICD-10-CM

## 2023-03-22 MED ORDER — OXYBUTYNIN CHLORIDE ER 15 MG PO TB24
15.0000 mg | ORAL_TABLET | Freq: Every day | ORAL | 0 refills | Status: DC
Start: 2023-03-22 — End: 2023-06-05

## 2023-04-29 ENCOUNTER — Other Ambulatory Visit: Payer: Self-pay | Admitting: Nurse Practitioner

## 2023-04-29 DIAGNOSIS — Z1231 Encounter for screening mammogram for malignant neoplasm of breast: Secondary | ICD-10-CM

## 2023-05-03 ENCOUNTER — Other Ambulatory Visit: Payer: Self-pay | Admitting: Nurse Practitioner

## 2023-05-03 DIAGNOSIS — G8929 Other chronic pain: Secondary | ICD-10-CM

## 2023-05-16 ENCOUNTER — Ambulatory Visit
Admission: RE | Admit: 2023-05-16 | Discharge: 2023-05-16 | Disposition: A | Discharge status: Home / Self Care | Payer: BC Managed Care – PPO | Source: Ambulatory Visit | Attending: Nurse Practitioner | Admitting: Nurse Practitioner

## 2023-05-16 DIAGNOSIS — Z1231 Encounter for screening mammogram for malignant neoplasm of breast: Secondary | ICD-10-CM | POA: Insufficient documentation

## 2023-06-02 ENCOUNTER — Other Ambulatory Visit: Payer: Self-pay | Admitting: Family Medicine

## 2023-06-02 DIAGNOSIS — N393 Stress incontinence (female) (male): Secondary | ICD-10-CM

## 2023-06-02 DIAGNOSIS — G8929 Other chronic pain: Secondary | ICD-10-CM

## 2023-06-03 ENCOUNTER — Ambulatory Visit (HOSPITAL_BASED_OUTPATIENT_CLINIC_OR_DEPARTMENT_OTHER): Payer: BC Managed Care – PPO | Admitting: Obstetrics & Gynecology

## 2023-06-05 ENCOUNTER — Encounter: Payer: Self-pay | Admitting: Family Medicine

## 2023-06-05 ENCOUNTER — Ambulatory Visit: Payer: BC Managed Care – PPO | Admitting: Family Medicine

## 2023-06-05 VITALS — BP 144/87 | HR 64 | Resp 18 | Ht 63.0 in | Wt 168.0 lb

## 2023-06-05 DIAGNOSIS — N393 Stress incontinence (female) (male): Secondary | ICD-10-CM | POA: Diagnosis not present

## 2023-06-05 DIAGNOSIS — F419 Anxiety disorder, unspecified: Secondary | ICD-10-CM

## 2023-06-05 DIAGNOSIS — M26609 Unspecified temporomandibular joint disorder, unspecified side: Secondary | ICD-10-CM | POA: Diagnosis not present

## 2023-06-05 DIAGNOSIS — I10 Essential (primary) hypertension: Secondary | ICD-10-CM | POA: Diagnosis not present

## 2023-06-05 DIAGNOSIS — F32A Depression, unspecified: Secondary | ICD-10-CM

## 2023-06-05 DIAGNOSIS — G44209 Tension-type headache, unspecified, not intractable: Secondary | ICD-10-CM

## 2023-06-05 MED ORDER — CYCLOBENZAPRINE HCL 5 MG PO TABS: ORAL_TABLET | ORAL | 1 refills | Status: DC

## 2023-06-05 MED ORDER — NORTRIPTYLINE HCL 10 MG PO CAPS
10.0000 mg | ORAL_CAPSULE | Freq: Every day | ORAL | 3 refills | Status: DC
Start: 2023-06-05 — End: 2023-06-05

## 2023-06-05 MED ORDER — OXYBUTYNIN CHLORIDE ER 15 MG PO TB24
15.0000 mg | ORAL_TABLET | Freq: Every day | ORAL | 1 refills | Status: DC
Start: 2023-06-05 — End: 2023-09-30

## 2023-06-05 MED ORDER — SERTRALINE HCL 50 MG PO TABS: ORAL_TABLET | ORAL | 1 refills | Status: DC

## 2023-06-05 NOTE — Patient Instructions (Addendum)
I am actually going to start the medicine cyclobenzaprine which is a little more effective than gabapentin for TMJ disease.  Similar to the gabapentin, it can make you sleepy which is why I am going to have you take it at bedtime.  Take your blood pressure at home twice a day for the next 3 days and send me a picture of the blood pressure log!

## 2023-06-05 NOTE — Assessment & Plan Note (Signed)
Presentation sounds similar to TMJ.  Trial of cyclobenzaprine 5 to 10 mg at bedtime for the next month.  If this does not alleviate headaches, consider referral to neurology or ENT.

## 2023-06-05 NOTE — Assessment & Plan Note (Signed)
BP goal <140/90.  Above goal.  Patient will check ambulatory blood pressure for the next 3 days and send those values to me.  If consistently above goal, increase to lisinopril 5 mg daily.

## 2023-06-05 NOTE — Progress Notes (Signed)
Established Patient Office Visit  Subjective   Patient ID: Pamela Francis, female    DOB: Nov 16, 1960  Age: 62 y.o. MRN: [id number]  Chief Complaint  Patient presents with   Hypertension   Headache    HPI Pamela Francis is a 62 y.o. female presenting today for follow up of hypertension, ongoing headaches. Hypertension: Patient here for follow-up of elevated blood pressure.  Pt denies chest pain, SOB, dizziness, edema, syncope, fatigue or heart palpitations. Taking lisinopril, reports excellent compliance with treatment. Denies side effects. Headaches: Headaches occur daily and have been occurring for quite some time.  She typically wakes up with a headache in the morning and immediately needs to take Tylenol.  She has not been evaluated by any specialists for headache.  They are most commonly frontal with some pain behind both the ears, but occasionally they can be in the occipital region.  Tylenol provides some relief but does not completely resolve headache.  A trial of tizanidine was not helpful after last appointment.  She endorses occasional photophobia and phonophobia with some headaches, but not with every headache.  She very rarely gets nauseous with the headaches.  She also endorses popping in her jaw.  Outpatient Medications Prior to Visit  Medication Sig Note   Calcium Carb-Cholecalciferol (CALCIUM 600 + D PO) Take 1 tablet by mouth daily.    Cholecalciferol (VITAMIN D PO) Take 2,000 tablets by mouth daily.    cyanocobalamin 1000 MCG tablet Take 1,000 mcg by mouth daily.    estradiol (ESTRACE) 0.1 MG/GM vaginal cream 1 gram vaginally twice weekly    fexofenadine (ALLEGRA) 180 MG tablet Take 180 mg by mouth daily.    fluticasone (FLONASE) 50 MCG/ACT nasal spray Place 2 sprays into both nostrils daily.    lisinopril (ZESTRIL) 2.5 MG tablet TAKE 1 TABLET(2.5 MG) BY MOUTH DAILY    omeprazole (PRILOSEC) 40 MG capsule Take 40 mg by mouth daily.    simvastatin (ZOCOR) 20 MG tablet  TAKE 1 TABLET(20 MG) BY MOUTH DAILY AT 6 PM    [DISCONTINUED] meloxicam (MOBIC) 15 MG tablet TAKE 1 TABLET(15 MG) BY MOUTH DAILY (Patient taking differently: Take 15 mg by mouth as needed for pain.) 06/05/2023: GI upset   [DISCONTINUED] oxybutynin (DITROPAN XL) 15 MG 24 hr tablet Take 1 tablet (15 mg total) by mouth daily.    [DISCONTINUED] sertraline (ZOLOFT) 50 MG tablet Take 75 mg (1.5 tablets) po QPM. May increase to 100 mg po QHS as indicated.    [DISCONTINUED] tiZANidine (ZANAFLEX) 4 MG tablet Take 0.5-1 tablets (2-4 mg total) by mouth every 6 (six) hours as needed for muscle spasms.    Facility-Administered Medications Prior to Visit  Medication Dose Route Frequency Provider   0.9 %  sodium chloride infusion  500 mL Intravenous Once Pyrtle, Carie Caddy, MD    ROS Negative unless otherwise noted in HPI   Objective:     BP (!) 144/87 (BP Location: Left Arm, Patient Position: Sitting, Cuff Size: Normal)   Pulse 64   Resp 18   Ht 5\' 3"  (1.6 m)   Wt 168 lb (76.2 kg)   LMP  (LMP Unknown)   SpO2 98%   BMI 29.76 kg/m   Physical Exam Constitutional:      General: She is not in acute distress.    Appearance: Normal appearance.  HENT:     Head: Normocephalic and atraumatic.     Salivary Glands: Right salivary gland is not diffusely enlarged. Left salivary gland is not  diffusely enlarged.     Comments: TTP over TMJ, masseter muscles, posterior to ears bilaterally.    Mouth/Throat:     Comments: Pain with dynamic loading (bit down on a tongue blade between upper and lower teeth on each side compressing the contralateral TMJ.  Positive for pain particularly on the right TMJ on testing) Eyes:     Extraocular Movements: Extraocular movements intact.     Right eye: Normal extraocular motion and no nystagmus.     Left eye: Normal extraocular motion and no nystagmus.     Pupils: Pupils are equal.     Right eye: Pupil is round and reactive.     Left eye: Pupil is round and reactive.   Cardiovascular:     Rate and Rhythm: Normal rate and regular rhythm.     Heart sounds: No murmur heard.    No friction rub. No gallop.  Pulmonary:     Effort: Pulmonary effort is normal. No respiratory distress.     Breath sounds: No wheezing, rhonchi or rales.  Musculoskeletal:        General: Normal range of motion.     Cervical back: Normal range of motion and neck supple. No rigidity.  Lymphadenopathy:     Cervical: No cervical adenopathy.  Skin:    General: Skin is warm and dry.  Neurological:     Mental Status: She is alert and oriented to person, place, and time.     GCS: GCS eye subscore is 4. GCS verbal subscore is 5. GCS motor subscore is 6.     Cranial Nerves: No cranial nerve deficit or facial asymmetry.     Gait: Gait normal.  Psychiatric:        Mood and Affect: Mood normal.        Behavior: Behavior normal.     Assessment & Plan:  Essential hypertension Assessment & Plan: BP goal <140/90.  Above goal.  Patient will check ambulatory blood pressure for the next 3 days and send those values to me.  If consistently above goal, increase to lisinopril 5 mg daily.   Muscle tension headache Assessment & Plan: Presentation sounds similar to TMJ.  Trial of cyclobenzaprine 5 to 10 mg at bedtime for the next month.  If this does not alleviate headaches, consider referral to neurology or ENT.   Stress incontinence -     oxyBUTYnin Chloride ER; Take 1 tablet (15 mg total) by mouth daily.  Dispense: 90 tablet; Refill: 1  TMJ disease -     Cyclobenzaprine HCl; Take 1-2 tablets (5-10 mg total) by mouth at bedtime.  Dispense: 30 tablet; Refill: 1  Anxiety and depression -     Sertraline HCl; Take 75 mg (1.5 tablets) po QPM. May increase to 100 mg po QHS as indicated.  Dispense: 180 tablet; Refill: 1    Return in about 4 weeks (around 07/03/2023) for follow-up for headache and TMJ.    [name], PA

## 2023-06-18 ENCOUNTER — Ambulatory Visit (HOSPITAL_BASED_OUTPATIENT_CLINIC_OR_DEPARTMENT_OTHER): Payer: BC Managed Care – PPO | Admitting: Obstetrics & Gynecology

## 2023-06-20 ENCOUNTER — Other Ambulatory Visit: Payer: Self-pay | Admitting: Nurse Practitioner

## 2023-06-20 DIAGNOSIS — I10 Essential (primary) hypertension: Secondary | ICD-10-CM

## 2023-06-21 MED ORDER — LISINOPRIL 2.5 MG PO TABS
2.5000 mg | ORAL_TABLET | Freq: Every day | ORAL | 0 refills | Status: DC
Start: 2023-06-21 — End: 2023-10-24

## 2023-06-27 NOTE — Progress Notes (Signed)
62 y.o. G5P1001 Married White or Caucasian female here for annual exam.  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 Lives with her spouse in [place]. Her daughter lives in [place] with her 4 children (1yo, preschool, elementary, middle). Pt keeps her grand-children on Mondays. Her Mother resides with her (drives short distances). Pt doing well emotionally.  Denies vaginal spotting or bleeding. Sexually active, some dryness. She has prescription for Estradiol intravaginal twice weekly but forgets occasionally-would like a refill. Pt reports both stress and urge urinary incontinence. Wasn't able to get to Pelvic Floor Physical therapy but remains open to it. She is agreeable to consult with UroGyn to discuss all options for management of mixed urinary incontinence. Referral placed.  Hx Osteopenia, due for follow-up Bone Density (would like [place] location). Takes Calcium/Vit D supplements. Limited milk intake. Does not smoke or drink alcohol.   No LMP recorded (lmp unknown). Patient is postmenopausal.          Sexually active: Yes.    The current method of family planning is post menopausal status.     The pregnancy intention screening data noted above was reviewed. Potential methods of contraception were discussed. The patient elected to proceed with No data recorded.  Exercising: Yes.     Smoker:  no  Health Maintenance: Pap:  For follow-up pap smear today, Hx LEEP 05/2021 Prior pap 11/2022 Negative.  MMG:  04/2023 Negative, BRCA 1/2 genetic screening Negative Colonoscopy:  UTD 11/2017  per pt, normal colonoscopy, repeat 11/2027 BMD:   Osteopenia noted 2022, f/u Dexa ordered Screening Labs: collected by PCP   reports that she has never smoked. She has never been exposed to tobacco smoke. She has never used smokeless tobacco. She reports current alcohol use. She reports that she does not use drugs.  Past Medical History:  Diagnosis Date   Allergy    Anxiety     Arthritis    Blood in stool    Chicken pox    Depression    Frequent headaches    Hyperlipidemia    Urine incontinence     Past Surgical History:  Procedure Laterality Date   BREAST BIOPSY Left 02/2005   neg   COLONOSCOPY     Dr. Alba Destine, [place] 2013   TONSILLECTOMY AND ADENOIDECTOMY     WRIST SURGERY Left     Current Outpatient Medications  Medication Sig Dispense Refill   Calcium Carb-Cholecalciferol (CALCIUM 600 + D PO) Take 1 tablet by mouth daily.     Cholecalciferol (VITAMIN D PO) Take 2,000 tablets by mouth daily.     cyanocobalamin 1000 MCG tablet Take 1,000 mcg by mouth daily.     cyclobenzaprine (FLEXERIL) 5 MG tablet Take 1-2 tablets (5-10 mg total) by mouth at bedtime. 30 tablet 1   fexofenadine (ALLEGRA) 180 MG tablet Take 180 mg by mouth daily.     fluticasone (FLONASE) 50 MCG/ACT nasal spray Place 2 sprays into both nostrils daily. 16 g 6   lisinopril (ZESTRIL) 2.5 MG tablet Take 1 tablet (2.5 mg total) by mouth daily. 90 tablet 0   omeprazole (PRILOSEC) 40 MG capsule Take 40 mg by mouth daily.     oxybutynin (DITROPAN XL) 15 MG 24 hr tablet Take 1 tablet (15 mg total) by mouth daily. 90 tablet 1   sertraline (ZOLOFT) 50 MG tablet Take 75 mg (1.5 tablets) po QPM. May increase to 100 mg po QHS as indicated. 180 tablet 1   simvastatin (ZOCOR)  20 MG tablet TAKE 1 TABLET(20 MG) BY MOUTH DAILY AT 6 PM 90 tablet 1   estradiol (ESTRACE) 0.1 MG/GM vaginal cream 1 gram vaginally twice weekly 42.5 g 4   No current facility-administered medications for this visit.    Family History  Problem Relation Age of Onset   Arthritis Mother    Hyperlipidemia Mother    Hypertension Mother    Colon polyps Mother    Lung cancer Father 40       smoking hx   Prostate cancer Maternal Uncle 36   Hodgkin's lymphoma Maternal Uncle 74   Prostate cancer Maternal Uncle 90       mets   Cancer Paternal Aunt        x3 pat aunts; unknown cancer; dx unknown age   Cancer Paternal  Uncle        unknown cancer; dx unknown age   Rheum arthritis Maternal Grandmother    Stroke Maternal Grandmother    Alcohol abuse Maternal Grandfather    Breast cancer Cousin 50       bilateral; mat female cousin   Cancer - Other Cousin        PALB2 gene mutation   Breast cancer Cousin 51       triple negative; mat female cousin   Ovarian cancer Cousin 11       d. 17   Lung cancer Cousin 65       mat female cousin   Breast cancer Other        MGF's sister; d. 34s   Hodgkin's lymphoma Half-Sister 26       d. 74; pat half sister   Colon cancer Neg Hx    Esophageal cancer Neg Hx    Pancreatic cancer Neg Hx    Stomach cancer Neg Hx    Rectal cancer Neg Hx    Liver cancer Neg Hx     ROS: Constitutional: negative Genitourinary:positive for urinary incontinence and vaginal dryness  Exam:   BP (!) 163/87 (BP Location: Left Arm, Patient Position: Sitting, Cuff Size: Normal)   Pulse 76   Ht 5\' 3"  (1.6 m)   Wt 167 lb (75.8 kg)   LMP  (LMP Unknown)   BMI 29.58 kg/m   Height: 5\' 3"  (160 cm)  General appearance: alert, cooperative and appears stated age Head: Normocephalic, without obvious abnormality, atraumatic Neck: no adenopathy, supple, symmetrical, trachea midline  Lungs: clear to auscultation bilaterally Breasts: normal appearance, no masses or tenderness, Inspection negative, No nipple retraction or dimpling, No nipple discharge or bleeding, No axillary or supraclavicular adenopathy, Normal to palpation without dominant masses Heart: regular rate and rhythm Abdomen: soft, non-tender; bowel sounds normal; no masses,  no organomegaly Extremities: extremities normal, atraumatic, no cyanosis or edema Skin: Skin color, texture, turgor normal. No rashes or lesions Lymph nodes: Cervical, supraclavicular, and axillary nodes normal. No abnormal inguinal nodes palpated Neurologic: Grossly normal   Pelvic: External genitalia:  no lesions              Urethra:  normal appearing  urethra with no masses, tenderness or lesions              Bartholins and Skenes: normal                 Vagina: normal appearing vagina with normal color and no discharge, no lesions              Cervix: multiparous appearance, no bleeding following Pap, no cervical  motion tenderness, and no lesions              Pap taken: Yes.   Bimanual Exam:  Uterus:  normal and normal size, contour, position, consistency, mobility, non-tender              Adnexa: no mass, fullness, tenderness              Anus:  +ext hemorrhoid, non-thrombosed, normal sphincter tone, no lesions  Chaperone, Hendricks Milo, CMA, was present for exam.  Assessment/Plan:  1. Dysplasia of cervix, low grade (CIN 1) -Hx LEEP 2022 - Cytology - PAP( [facility])  2. H/O LEEP -Follow-up pap planned for today - Cytology - PAP( [place])  3. Osteopenia after menopause -Continue weight bearing exercise and adequate Vitamin D/Calcium intake - DG BONE DENSITY (DXA); Future  4. Screening exam for skin cancer -Pt will establish care with Dermatology - Ambulatory referral to Dermatology  5. Mixed incontinence urge and stress - Ambulatory referral to Urogynecology - estradiol (ESTRACE) 0.1 MG/GM vaginal cream; 1 gram vaginally twice weekly  Dispense: 42.5 g; Refill: 4  6. Vaginal atrophy - estradiol (ESTRACE) 0.1 MG/GM vaginal cream; 1 gram vaginally twice weekly  Dispense: 42.5 g; Refill: 4  7. Encounter for gynecological examination with abnormal finding (vaginal atrophy/incontinence)  RTO one year for annual gyn exam and pap smear and prn if issues arise. Letta Kocher

## 2023-07-01 ENCOUNTER — Other Ambulatory Visit (HOSPITAL_COMMUNITY)
Admission: RE | Admit: 2023-07-01 | Discharge: 2023-07-01 | Disposition: A | Discharge status: Home / Self Care | Payer: BC Managed Care – PPO | Source: Ambulatory Visit | Attending: Certified Nurse Midwife | Admitting: Certified Nurse Midwife

## 2023-07-01 ENCOUNTER — Ambulatory Visit (HOSPITAL_BASED_OUTPATIENT_CLINIC_OR_DEPARTMENT_OTHER): Payer: BC Managed Care – PPO | Admitting: Certified Nurse Midwife

## 2023-07-01 ENCOUNTER — Encounter (HOSPITAL_BASED_OUTPATIENT_CLINIC_OR_DEPARTMENT_OTHER): Payer: Self-pay | Admitting: Certified Nurse Midwife

## 2023-07-01 VITALS — BP 163/87 | HR 76 | Ht 63.0 in | Wt 167.0 lb

## 2023-07-01 DIAGNOSIS — Z780 Asymptomatic menopausal state: Secondary | ICD-10-CM

## 2023-07-01 DIAGNOSIS — M8580 Other specified disorders of bone density and structure, unspecified site: Secondary | ICD-10-CM | POA: Insufficient documentation

## 2023-07-01 DIAGNOSIS — N870 Mild cervical dysplasia: Secondary | ICD-10-CM | POA: Insufficient documentation

## 2023-07-01 DIAGNOSIS — Z01411 Encounter for gynecological examination (general) (routine) with abnormal findings: Secondary | ICD-10-CM

## 2023-07-01 DIAGNOSIS — N952 Postmenopausal atrophic vaginitis: Secondary | ICD-10-CM

## 2023-07-01 DIAGNOSIS — N3946 Mixed incontinence: Secondary | ICD-10-CM

## 2023-07-01 DIAGNOSIS — Z9889 Other specified postprocedural states: Secondary | ICD-10-CM

## 2023-07-01 DIAGNOSIS — Z1283 Encounter for screening for malignant neoplasm of skin: Secondary | ICD-10-CM

## 2023-07-01 MED ORDER — ESTRADIOL 0.1 MG/GM VA CREA: TOPICAL_CREAM | VAGINAL | 4 refills | Status: AC

## 2023-07-04 LAB — CYTOLOGY - PAP
Adequacy: ABSENT
Comment: NEGATIVE
Diagnosis: UNDETERMINED — AB
High risk HPV: NEGATIVE

## 2023-07-08 ENCOUNTER — Ambulatory Visit: Payer: BC Managed Care – PPO | Admitting: Family Medicine

## 2023-07-08 VITALS — BP 134/79 | HR 81 | Resp 18 | Ht 63.0 in | Wt 167.0 lb

## 2023-07-08 DIAGNOSIS — R519 Headache, unspecified: Secondary | ICD-10-CM | POA: Diagnosis not present

## 2023-07-08 DIAGNOSIS — M26609 Unspecified temporomandibular joint disorder, unspecified side: Secondary | ICD-10-CM

## 2023-07-08 MED ORDER — AMITRIPTYLINE HCL 10 MG PO TABS
10.0000 mg | ORAL_TABLET | Freq: Every day | ORAL | 1 refills | Status: DC
Start: 2023-07-08 — End: 2023-11-11

## 2023-07-08 NOTE — Patient Instructions (Addendum)
You can continue taking the cyclobenzaprine at bedtime, or you can switch to another medication called amitriptyline that I sent in for you while you are waiting to see the neurologist.  DO NOT TAKE CYCLOBENZAPRINE AT THE SAME TIME AS AMITRIPTYLINE.

## 2023-07-08 NOTE — Progress Notes (Signed)
Established Patient Office Visit  Subjective   Patient ID: Pamela Francis, female    DOB: Feb 27, 1961  Age: 62 y.o. MRN: [id number]  Chief Complaint  Patient presents with   Headache   Temporomandibular Joint Pain    HPI Pamela Francis is a 62 y.o. female presenting today for follow up of headache and TMJ.  Previously endorsed daily headaches, typically waking up with a headache in the morning.  Initiated trial of cyclobenzaprine 5 to 10 mg at bedtime.  Minimal improvement with cyclobenzaprine, it also makes her very groggy the next day, so she has not taken more than 5 mg before bed.  At times, she has only taken 2.5 mg at bedtime.  She still gets daily headache in the morning requiring immediate pain medication.  Outpatient Medications Prior to Visit  Medication Sig   Calcium Carb-Cholecalciferol (CALCIUM 600 + D PO) Take 1 tablet by mouth daily.   Cholecalciferol (VITAMIN D PO) Take 2,000 tablets by mouth daily.   cyanocobalamin 1000 MCG tablet Take 1,000 mcg by mouth daily.   cyclobenzaprine (FLEXERIL) 5 MG tablet Take 1-2 tablets (5-10 mg total) by mouth at bedtime.   estradiol (ESTRACE) 0.1 MG/GM vaginal cream 1 gram vaginally twice weekly   fexofenadine (ALLEGRA) 180 MG tablet Take 180 mg by mouth daily.   fluticasone (FLONASE) 50 MCG/ACT nasal spray Place 2 sprays into both nostrils daily.   lisinopril (ZESTRIL) 2.5 MG tablet Take 1 tablet (2.5 mg total) by mouth daily.   omeprazole (PRILOSEC) 40 MG capsule Take 40 mg by mouth daily.   oxybutynin (DITROPAN XL) 15 MG 24 hr tablet Take 1 tablet (15 mg total) by mouth daily.   sertraline (ZOLOFT) 50 MG tablet Take 75 mg (1.5 tablets) po QPM. May increase to 100 mg po QHS as indicated.   simvastatin (ZOCOR) 20 MG tablet TAKE 1 TABLET(20 MG) BY MOUTH DAILY AT 6 PM   No facility-administered medications prior to visit.    ROS Negative unless otherwise noted in HPI   Objective:     BP 134/79 (BP Location: Left Arm, Patient  Position: Sitting, Cuff Size: Normal)   Pulse 81   Resp 18   Ht 5\' 3"  (1.6 m)   Wt 167 lb (75.8 kg)   LMP  (LMP Unknown)   SpO2 98%   BMI 29.58 kg/m   Physical Exam Constitutional:      General: She is not in acute distress.    Appearance: Normal appearance.  HENT:     Head: Normocephalic and atraumatic.  Pulmonary:     Effort: Pulmonary effort is normal. No respiratory distress.  Musculoskeletal:     Cervical back: Normal range of motion.  Neurological:     General: No focal deficit present.     Mental Status: She is alert and oriented to person, place, and time. Mental status is at baseline.  Psychiatric:        Mood and Affect: Mood normal.        Thought Content: Thought content normal.        Judgment: Judgment normal.      Assessment & Plan:  Chronic daily headache -     Ambulatory referral to Neurology -     Amitriptyline HCl; Take 1 tablet (10 mg total) by mouth at bedtime. May take instead of cyclobenzaprine at bedtime.  Dispense: 30 tablet; Refill: 1  TMJ disease -     Ambulatory referral to Neurology -     Amitriptyline HCl;  Take 1 tablet (10 mg total) by mouth at bedtime. May take instead of cyclobenzaprine at bedtime.  Dispense: 30 tablet; Refill: 1  Placing referral to neurology to discuss possible alternatives.  In the meantime, we discussed that she may continue taking cyclobenzaprine at bedtime, or she can switch to amitriptyline at bedtime.  Emphasized that she should not take both medications at the same time.  Patient verbalized understanding and is agreeable to this plan.  Return in about 3 months (around 10/08/2023) for follow-up for HTN, HA.    Pamela Quitter, PA

## 2023-08-29 ENCOUNTER — Other Ambulatory Visit: Payer: Self-pay | Admitting: Family Medicine

## 2023-08-29 DIAGNOSIS — E782 Mixed hyperlipidemia: Secondary | ICD-10-CM

## 2023-09-12 IMAGING — MG MM DIGITAL SCREENING BILAT W/ TOMO AND CAD
8 series · 8 of 24 positions shown · non-contrast
Comparison: Previous exam(s).

CLINICAL DATA: Screening.

EXAM:
DIGITAL SCREENING BILATERAL MAMMOGRAM WITH TOMOSYNTHESIS AND CAD
TECHNIQUE: Bilateral screening digital craniocaudal and mediolateral oblique
mammograms were obtained. Bilateral screening digital breast
tomosynthesis was performed. The images were evaluated with
computer-aided detection.

[R MLO synth-2D]
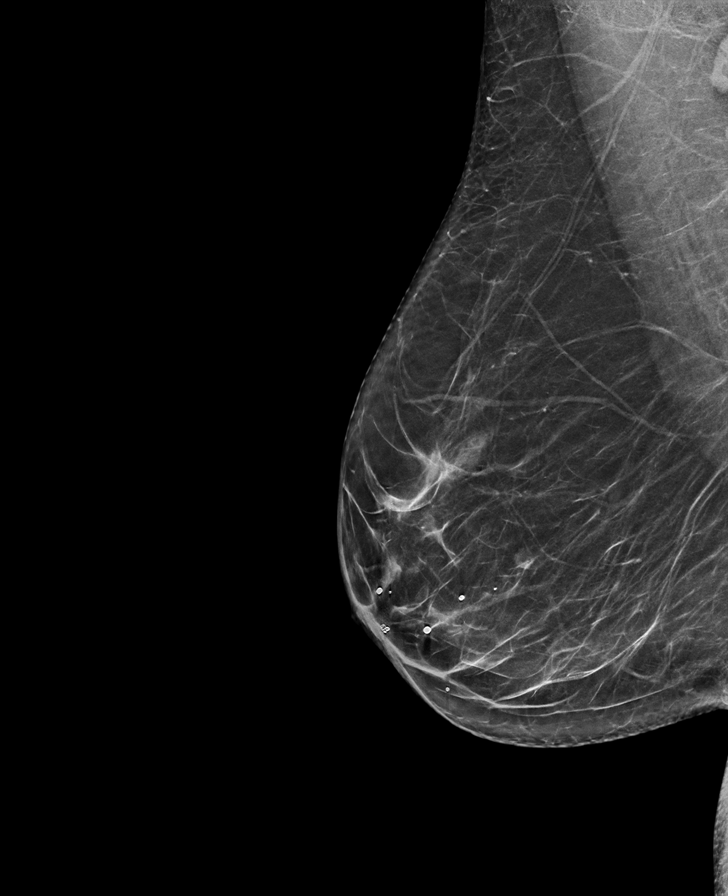

[L CC synth-2D]
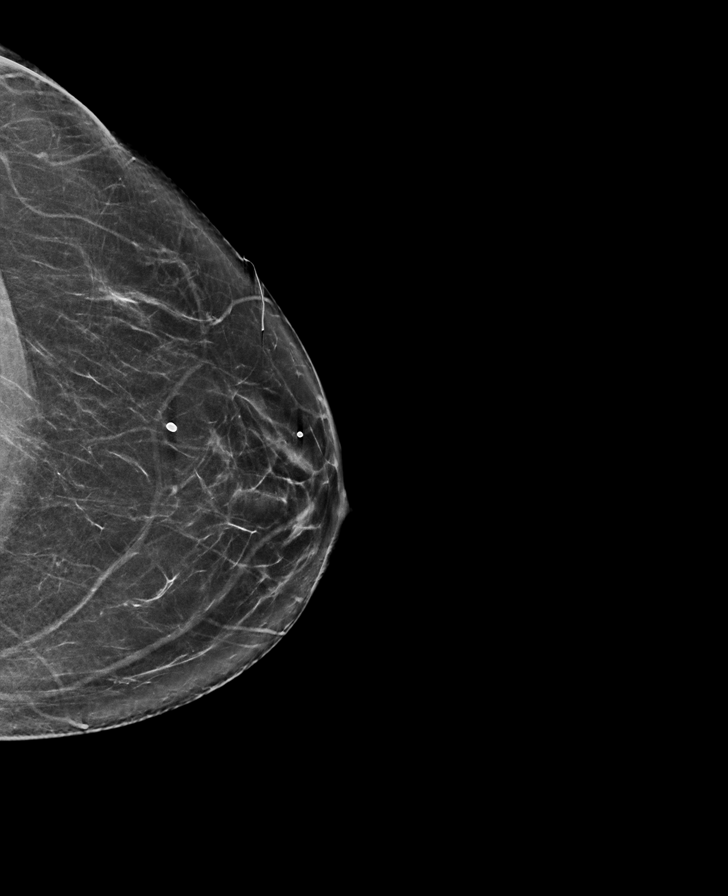

[R CC synth-2D]
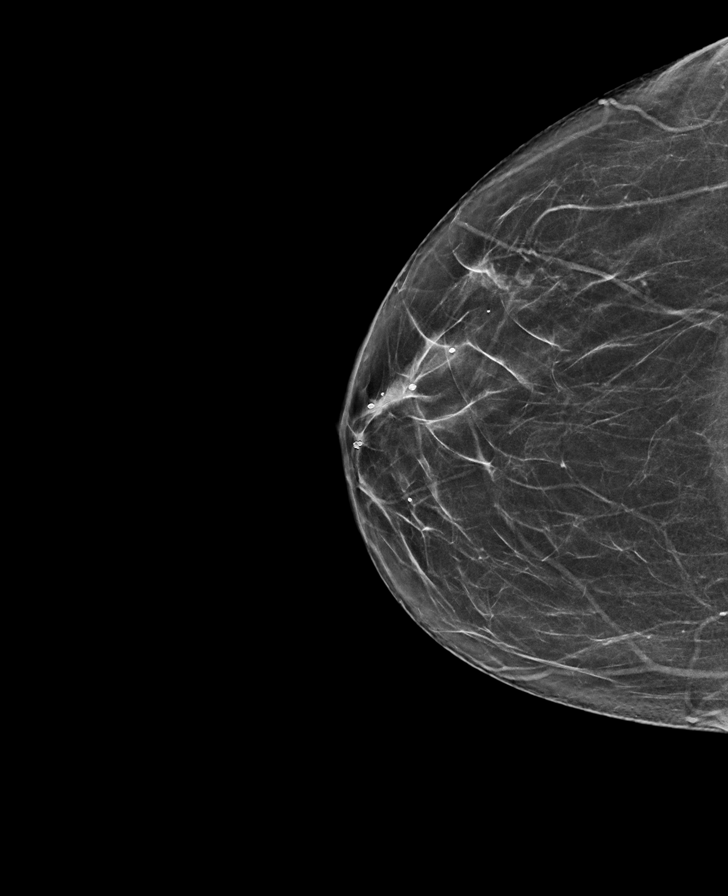

[L MLO synth-2D]
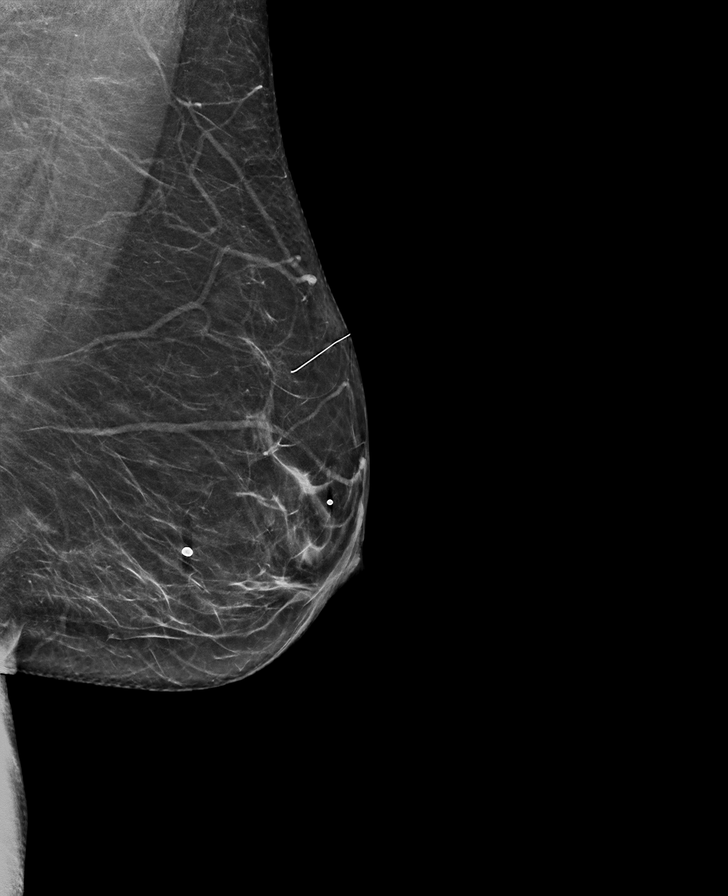

[R MLO tomo · tomo slice 37/72.0]
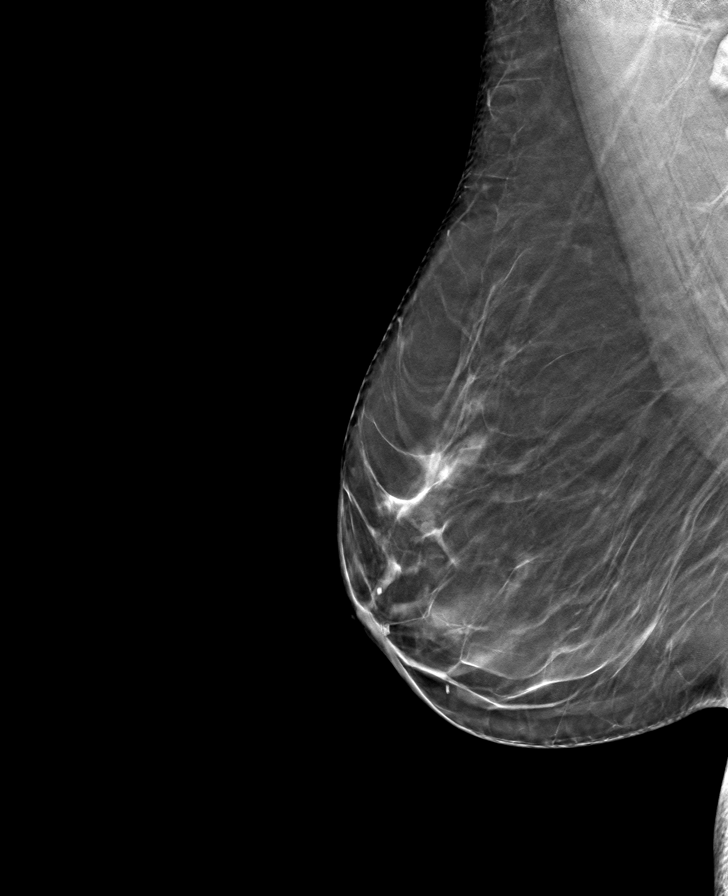

[R CC tomo · tomo slice 33/66.0]
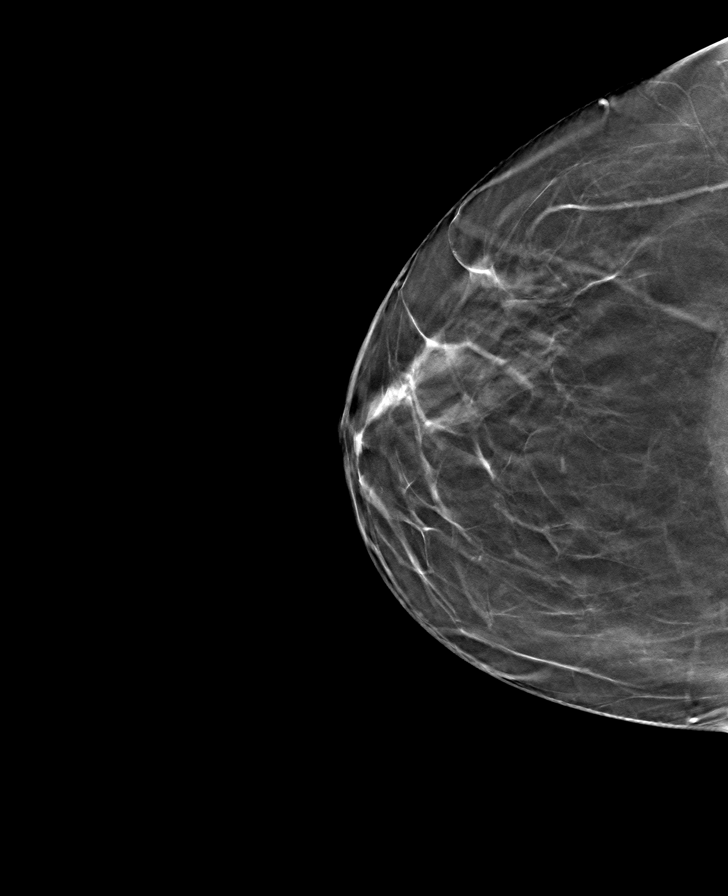

[L MLO tomo · tomo slice 35/68.0]
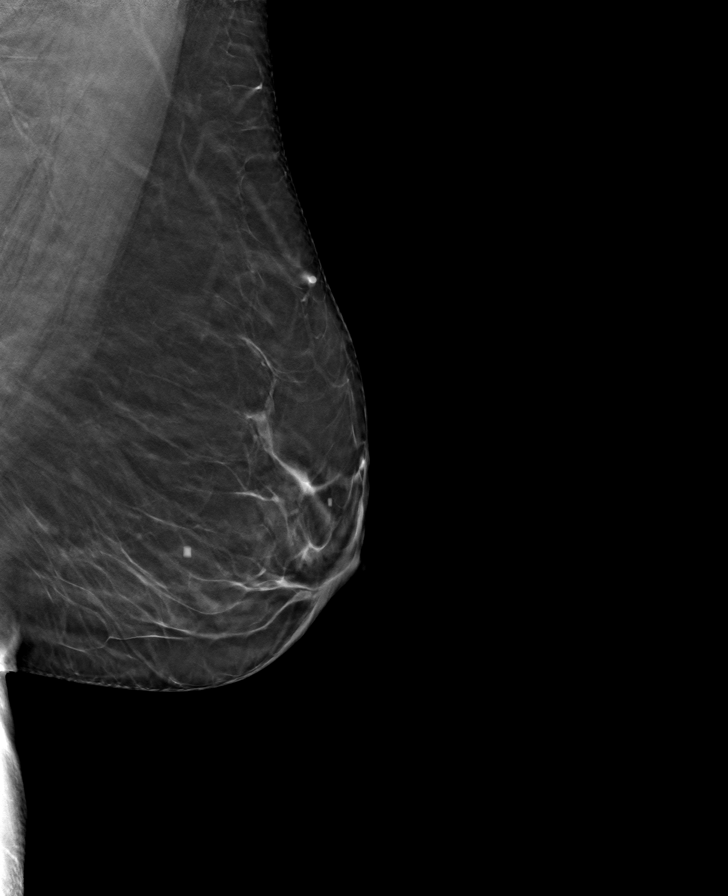

[L CC tomo · tomo slice 35/68.0]
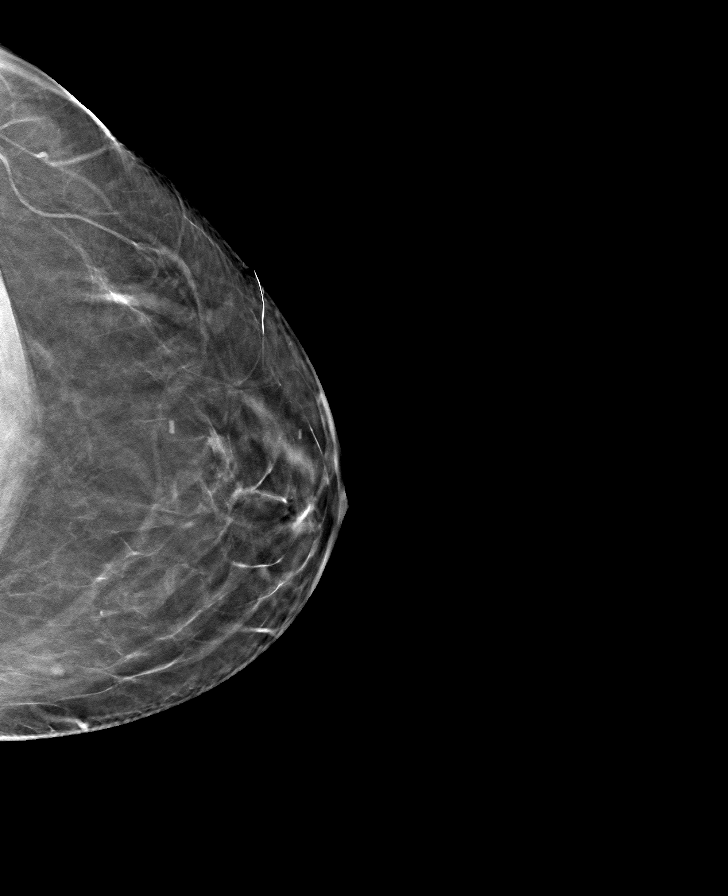

[8 of 24 positions shown; findings below may reference images not displayed]

ACR Breast Density Category b: There are scattered areas of
fibroglandular density.
FINDINGS: There are no findings suspicious for malignancy.
IMPRESSION: No mammographic evidence of malignancy. A result letter of this
screening mammogram will be mailed directly to the patient.

RECOMMENDATION:
Screening mammogram in one year. (Code:51-O-LD2)

BI-RADS CATEGORY  1: Negative.

## 2023-09-30 ENCOUNTER — Encounter: Payer: Self-pay | Admitting: Obstetrics and Gynecology

## 2023-09-30 ENCOUNTER — Ambulatory Visit: Payer: BC Managed Care – PPO | Admitting: Obstetrics and Gynecology

## 2023-09-30 VITALS — BP 154/80 | HR 70 | Ht 63.0 in | Wt 172.4 lb

## 2023-09-30 DIAGNOSIS — N3281 Overactive bladder: Secondary | ICD-10-CM

## 2023-09-30 DIAGNOSIS — N393 Stress incontinence (female) (male): Secondary | ICD-10-CM

## 2023-09-30 DIAGNOSIS — R350 Frequency of micturition: Secondary | ICD-10-CM

## 2023-09-30 DIAGNOSIS — R159 Full incontinence of feces: Secondary | ICD-10-CM | POA: Insufficient documentation

## 2023-09-30 DIAGNOSIS — N952 Postmenopausal atrophic vaginitis: Secondary | ICD-10-CM

## 2023-09-30 DIAGNOSIS — N812 Incomplete uterovaginal prolapse: Secondary | ICD-10-CM

## 2023-09-30 DIAGNOSIS — K5904 Chronic idiopathic constipation: Secondary | ICD-10-CM

## 2023-09-30 LAB — POCT URINALYSIS DIPSTICK
Bilirubin, UA: NEGATIVE
Glucose, UA: NEGATIVE
Ketones, UA: NEGATIVE
Leukocytes, UA: NEGATIVE
Nitrite, UA: NEGATIVE
Protein, UA: NEGATIVE
Spec Grav, UA: 1.02 (ref 1.010–1.025)
Urobilinogen, UA: 0.2 U/dL
pH, UA: 5.5 (ref 5.0–8.0)

## 2023-09-30 MED ORDER — MIRABEGRON ER 50 MG PO TB24
50.0000 mg | ORAL_TABLET | Freq: Every day | ORAL | 5 refills | Status: DC
Start: 2023-09-30 — End: 2024-04-23

## 2023-09-30 NOTE — Assessment & Plan Note (Signed)
-   Stage II anterior, Stage II posterior, Stage I apical prolapse - For treatment of pelvic organ prolapse, we discussed options for management including expectant management, conservative management, and surgical management, such as Kegels, a pessary, pelvic floor physical therapy, and specific surgical procedures. - We discussed two options for prolapse repair:  1) vaginal repair without mesh - Pros - safer, no mesh complications - Cons - not as strong as mesh repair, higher risk of recurrence  2) laparoscopic repair with mesh - Pros - stronger, better long-term success - Cons - risks of mesh implant (erosion into vagina or bladder, adhering to the rectum, pain) - these risks are lower than with a vaginal mesh but still exist - recommended hysterectomy with prolapse repair due to history of abnormal pap smears

## 2023-09-30 NOTE — Assessment & Plan Note (Addendum)
-   For treatment of stress urinary incontinence,  non-surgical options include expectant management, weight loss, physical therapy, as well as a pessary.  Surgical options include a midurethral sling, Burch urethropexy, and transurethral injection of a bulking agent. - Demonstrated leakage on exam today. We discussed this is likely contributing to her leakage symptoms and not treated with medication.  - We discussed that urethral bulking can be done in the office if she is not looking to have a more extensive surgery.

## 2023-09-30 NOTE — Progress Notes (Signed)
 New Patient Evaluation and Consultation  Referring Provider: Tad Arland POUR, CNM PCP: Wallace Joesph LABOR, PA Date of Service: 09/30/2023  SUBJECTIVE Chief Complaint: New Patient (Initial Visit) Pamela Francis is a 63 y.o. female is here for mixed incontinence./)  History of Present Illness: Pamela Francis is a 63 y.o. White or Caucasian female seen in consultation at the request of CNM Arland Tad for evaluation of incontinence.    Review of records significant for: Has both stress and urge incontinence. Was unable to get to pelvic PT.   Urinary Symptoms: Leaks urine with cough/ sneeze, laughing, exercise, lifting, going from sitting to standing, during sex, with a full bladder, with movement to the bathroom, with urgency, and continuously Leaks 5+ time(s) per day. SUI = UUI.  Pad use: 2-3 pads per day.   Patient is bothered by UI symptoms. Currently on oxybutyinin 15mg  XL, was previously on oxybutynin  5mg . Has not tried other medications.   Day time voids- every 2 hours.  Nocturia: 1-2 times per night to void. Voiding dysfunction:  empties bladder well.  Patient does not use a catheter to empty bladder.  When urinating, patient feels dribbling after finishing Drinks: 2 cups coffee, 1-2 glasses unsweet iced tea (decaf), water per day  UTIs:  0  UTI's in the last year.   Denies history of blood in urine and kidney or bladder stones   Pelvic Organ Prolapse Symptoms:                  Patient Admits to a feeling of a bulge the vaginal area- notices it sometimes Patient Admits to seeing a bulge- sometimes This bulge is bothersome.  Bowel Symptom: Bowel movements: 1 time(s) per day Stool consistency: hard or soft  (can be anywhere from [facility] 1-7 depending on the day) Straining: yes, sometimes Splinting: yes.  Incomplete evacuation: yes Patient Admits to accidental bowel leakage / fecal incontinence  Occurs: a couple time(s) per day  Consistency with leakage: liquid Bowel  regimen: none  HM Colonoscopy          Upcoming     Colonoscopy (Every 10 Years) Next due on 12/05/2027    12/04/2017  COLONOSCOPY   Only the first 1 history entries have been loaded, but more history exists.                Sexual Function Sexually active: yes.  Pain with sex: Yes, at the vaginal opening, deep in the pelvis, has discomfort due to dryness Using vaginal estrogen cream  Pelvic Pain Denies pelvic pain    Past Medical History:  Past Medical History:  Diagnosis Date   Allergy    Anxiety    Arthritis    Blood in stool    Chicken pox    Depression    Frequent headaches    Hyperlipidemia    Hypertension    Urine incontinence      Past Surgical History:   Past Surgical History:  Procedure Laterality Date   BREAST BIOPSY Left 02/2005   neg   COLONOSCOPY     Dr. Tamea [place], [place] 2013   LEEP     TONSILLECTOMY AND ADENOIDECTOMY     WRIST SURGERY Left      Past OB/GYN History: OB History  Gravida Para Term Preterm AB Living  1 1 1   1   SAB IAB Ectopic Multiple Live Births      1    # Outcome Date GA Lbr Len/2nd Weight Sex Type Anes  PTL Lv  1 Term 1986     Vag-Spont   LIV    Menopausal: Yes, Denies vaginal bleeding since menopause     Component Value Date/Time   DIAGPAP (A) 07/01/2023 1128    - Atypical squamous cells of undetermined significance (ASC-US )   DIAGPAP  11/26/2022 0925    - Negative for intraepithelial lesion or malignancy (NILM)   DIAGPAP - Low grade squamous intraepithelial lesion (LSIL) (A) 08/20/2022 1151   HPVHIGH Negative 07/01/2023 1128   HPVHIGH Positive (A) 08/20/2022 1151   HPVHIGH Positive (A) 02/07/2022 1610   ADEQPAP  07/01/2023 1128    Satisfactory for evaluation; transformation zone component ABSENT.   ADEQPAP  11/26/2022 0925    Satisfactory for evaluation; transformation zone component PRESENT.   ADEQPAP  08/20/2022 1151    Satisfactory for evaluation; transformation zone component ABSENT.     Medications: Patient has a current medication list which includes the following prescription(s): amitriptyline , calcium carb-cholecalciferol, vitamin d , cyanocobalamin, cyclobenzaprine , estradiol , fexofenadine, fluticasone , lisinopril , mirabegron  er, omeprazole, sertraline , and simvastatin .   Allergies: Patient has no known allergies.   Social History:  Social History   Tobacco Use   Smoking status: Never    Passive exposure: Never   Smokeless tobacco: Never  Vaping Use   Vaping status: Never Used  Substance Use Topics   Alcohol use: Yes    Alcohol/week: 0.0 standard drinks of alcohol    Comment: occasional   Drug use: No    Relationship status: married Patient lives with husband.   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 Regular exercise: Yes: walking History of abuse: No  Family History:   Family History  Problem Relation Age of Onset   Arthritis Mother    Hyperlipidemia Mother    Hypertension Mother    Colon polyps Mother    Lung cancer Father 64       smoking hx   Prostate cancer Maternal Uncle 27   Hodgkin's lymphoma Maternal Uncle 74   Prostate cancer Maternal Uncle 19       mets   Cancer Paternal Aunt        x3 pat aunts; unknown cancer; dx unknown age   Cancer Paternal Uncle        unknown cancer; dx unknown age   Rheum arthritis Maternal Grandmother    Stroke Maternal Grandmother    Alcohol abuse Maternal Grandfather    Breast cancer Cousin 50       bilateral; mat female cousin   Cancer - Other Cousin        PALB2 gene mutation   Breast cancer Cousin 110       triple negative; mat female cousin   Ovarian cancer Cousin 11       d. 44   Lung cancer Cousin 54       mat female cousin   Breast cancer Other        MGF's sister; d. 76s   Hodgkin's lymphoma Half-Sister 26       d. 59; pat half sister   Colon cancer Neg Hx    Esophageal cancer Neg Hx    Pancreatic cancer Neg Hx    Stomach cancer Neg Hx    Rectal cancer Neg Hx     Liver cancer Neg Hx      Review of Systems: Review of Systems  Constitutional:  Positive for malaise/fatigue. Negative for fever and weight loss.  Respiratory:  Negative for cough, shortness of breath and  wheezing.   Cardiovascular:  Positive for leg swelling. Negative for chest pain and palpitations.  Gastrointestinal:  Negative for abdominal pain and blood in stool.  Genitourinary:  Negative for dysuria.  Musculoskeletal:  Negative for myalgias.  Skin:  Negative for rash.  Neurological:  Positive for headaches. Negative for dizziness.  Endo/Heme/Allergies:  Does not bruise/bleed easily.  Psychiatric/Behavioral:  Negative for depression. The patient is nervous/anxious.      OBJECTIVE Physical Exam: Vitals:   09/30/23 0840  BP: (!) 154/80  Pulse: 70  Weight: 172 lb 6.4 oz (78.2 kg)  Height: 5' 3 (1.6 m)    Physical Exam   GU / Detailed Urogynecologic Evaluation:  Pelvic Exam: Normal external female genitalia; Bartholin's and Skene's glands normal in appearance; urethral meatus normal in appearance, no urethral masses or discharge.   CST: positive  Speculum exam reveals normal vaginal mucosa with atrophy. Cervix normal appearance. Uterus normal single, nontender. Adnexa no mass, fullness, tenderness.     Pelvic floor strength II/V, puborectalis III/V external anal sphincter IV/V  Pelvic floor musculature: Right levator non-tender, Right obturator tender, Left levator non-tender, Left obturator non-tender  POP-Q:   POP-Q  -1                                            Aa   -1                                           Ba  -6                                              C   3                                            Gh  4                                            Pb  10                                            tvl   0                                            Ap  0                                            Bp  -8.5  D      Rectal Exam:  Normal sphincter tone, moderate distal rectocele, enterocoele not present, no rectal masses, no sign of dyssynergia when asking the patient to bear down.  Post-Void Residual (PVR) by Bladder Scan: In order to evaluate bladder emptying, we discussed obtaining a postvoid residual and patient agreed to this procedure.  Procedure: The ultrasound unit was placed on the patient's abdomen in the suprapubic region after the patient had voided.    Post Void Residual - 09/30/23 0854       Post Void Residual   Post Void Residual 98 mL              Laboratory Results: Lab Results  Component Value Date   COLORU yellow 09/30/2023   CLARITYU clear 09/30/2023   GLUCOSEUR Negative 09/30/2023   BILIRUBINUR negative 09/30/2023   KETONESU negative 09/30/2023   SPECGRAV 1.020 09/30/2023   RBCUR Trace intact 09/30/2023   PHUR 5.5 09/30/2023   PROTEINUR Negative 09/30/2023   UROBILINOGEN 0.2 09/30/2023   LEUKOCYTESUR Negative 09/30/2023    Lab Results  Component Value Date   CREATININE 0.83 03/04/2023   CREATININE 0.75 01/31/2022   CREATININE 0.82 09/27/2020    Lab Results  Component Value Date   HGBA1C 5.8 (H) 03/04/2023    Lab Results  Component Value Date   HGB 12.8 03/04/2023     ASSESSMENT AND PLAN Ms. Wendt is a 63 y.o. with:  1. Overactive bladder   2. SUI (stress urinary incontinence, female)   3. Urinary frequency   4. Uterovaginal prolapse, incomplete   5. Vaginal atrophy   6. Chronic idiopathic constipation   7. Incontinence of feces, unspecified fecal incontinence type     Overactive bladder Assessment & Plan: - We discussed the symptoms of overactive bladder (OAB), which include urinary urgency, urinary frequency, nocturia, with or without urge incontinence.  While we do not know the exact etiology of OAB, several treatment options exist. We discussed management including behavioral therapy (decreasing bladder irritants,  urge suppression strategies, timed voids, bladder retraining), physical therapy, medication; for refractory cases posterior tibial nerve stimulation, sacral neuromodulation, and intravesical botulinum toxin injection.  - Stop oxybutynin . Prescribed myrbetriq  50mg  daily. For Beta-3 agonist medication, we discussed the potential side effect of elevated blood pressure which is more likely to occur in individuals with uncontrolled hypertension. - decrease bladder irritants- tea and coffee in take and increase water  Orders: -     Mirabegron  ER; Take 1 tablet (50 mg total) by mouth daily.  Dispense: 30 tablet; Refill: 5  SUI (stress urinary incontinence, female) Assessment & Plan: - For treatment of stress urinary incontinence,  non-surgical options include expectant management, weight loss, physical therapy, as well as a pessary.  Surgical options include a midurethral sling, Burch urethropexy, and transurethral injection of a bulking agent. - Demonstrated leakage on exam today. We discussed this is likely contributing to her leakage symptoms and not treated with medication.  - We discussed that urethral bulking can be done in the office if she is not looking to have a more extensive surgery.    Urinary frequency -     POCT urinalysis dipstick  Uterovaginal prolapse, incomplete Assessment & Plan: - Stage II anterior, Stage II posterior, Stage I apical prolapse - For treatment of pelvic organ prolapse, we discussed options for management including expectant management, conservative management, and surgical management, such as Kegels, a pessary, pelvic floor physical therapy, and specific surgical procedures. - We discussed two options for  prolapse repair:  1) vaginal repair without mesh - Pros - safer, no mesh complications - Cons - not as strong as mesh repair, higher risk of recurrence  2) laparoscopic repair with mesh - Pros - stronger, better long-term success - Cons - risks of mesh  implant (erosion into vagina or bladder, adhering to the rectum, pain) - these risks are lower than with a vaginal mesh but still exist - recommended hysterectomy with prolapse repair due to history of abnormal pap smears   Vaginal atrophy Assessment & Plan: - Try to be more consistent with using vaginal estrogen cream twice weekly.    Chronic idiopathic constipation Assessment & Plan: - recommended starting with fiber supplementation to improve stool consistency   Incontinence of feces, unspecified fecal incontinence type Assessment & Plan: - Treatment options include anti-diarrhea medication (loperamide/ Imodium OTC or prescription lomotil), fiber supplements, physical therapy, and possible sacral neuromodulation or surgery.   - Recommended starting with daily metamucil to help improve stool consistency.    Return 1 month   Pamela LOISE Caper, MD

## 2023-09-30 NOTE — Assessment & Plan Note (Signed)
-   recommended starting with fiber supplementation to improve stool consistency

## 2023-09-30 NOTE — Assessment & Plan Note (Signed)
-   Treatment options include anti-diarrhea medication (loperamide/ Imodium OTC or prescription lomotil), fiber supplements, physical therapy, and possible sacral neuromodulation or surgery.   - Recommended starting with daily metamucil to help improve stool consistency.

## 2023-09-30 NOTE — Assessment & Plan Note (Signed)
-   Try to be more consistent with using vaginal estrogen cream twice weekly.

## 2023-09-30 NOTE — Assessment & Plan Note (Addendum)
-   We discussed the symptoms of overactive bladder (OAB), which include urinary urgency, urinary frequency, nocturia, with or without urge incontinence.  While we do not know the exact etiology of OAB, several treatment options exist. We discussed management including behavioral therapy (decreasing bladder irritants, urge suppression strategies, timed voids, bladder retraining), physical therapy, medication; for refractory cases posterior tibial nerve stimulation, sacral neuromodulation, and intravesical botulinum toxin injection.  - Stop oxybutynin . Prescribed myrbetriq  50mg  daily. For Beta-3 agonist medication, we discussed the potential side effect of elevated blood pressure which is more likely to occur in individuals with uncontrolled hypertension. - decrease bladder irritants- tea and coffee in take and increase water

## 2023-09-30 NOTE — Patient Instructions (Addendum)
 Stop oxybutynin  and start Myrbetriq  for OAB symptoms. We discussed the symptoms of overactive bladder (OAB), which include urinary urgency, urinary frequency, nocturia, with or without urge incontinence.  While we do not know the exact etiology of OAB, several treatment options exist. We discussed management including behavioral therapy (decreasing bladder irritants, urge suppression strategies, timed voids, bladder retraining), physical therapy, medication; for refractory cases posterior tibial nerve stimulation, sacral neuromodulation, and intravesical botulinum toxin injection.   Today we talked about ways to manage bladder urgency such as altering your diet to avoid irritative beverages and foods (bladder diet) as well as attempting to decrease stress and other exacerbating factors.    The Most Bothersome Foods* The Least Bothersome Foods*  Coffee - Regular & Decaf Tea - caffeinated Carbonated beverages - cola, non-colas, diet & caffeine-free Alcohols - Beer, Red Wine, White Wine, [address] - Grapefruit, [place], Orange, [employer] - Cranberry, Grapefruit, Orange, Pineapple Vegetables - Tomato & Tomato Products Flavor Enhancers - Hot peppers, Spicy foods, Chili, Horseradish, Vinegar, Monosodium glutamate (MSG) Artificial Sweeteners - NutraSweet, Sweet 'N Low, Equal (sweetener), Saccharin Ethnic foods - Mexican, Thai, Indian food [employer] - low-fat & whole Fruits - Bananas, Blueberries, Honeydew melon, Pears, Raisins, Watermelon Vegetables - Broccoli, [address] Sprouts, [place], Carrots, Cauliflower, [place], Cucumber, Mushrooms, Peas, Radishes, Squash, Zucchini, White potatoes, Sweet potatoes & yams Poultry - Chicken, Eggs, Turkey, [employer] - Beef, [employer], Lamb Seafood - Shrimp, [place] fish, Salmon Grains - Oat, Rice Snacks - Pretzels, Popcorn  *Mitch ALF et al. Diet and its role in interstitial cystitis/bladder pain syndrome (IC/BPS) and comorbid conditions. [employer]JU  [employer]. BJU Int. 2012 Jan 11.   Accidental Bowel Leakage: Our goal is to achieve formed bowel movements daily or every-other-day without leakage.  You may need to try different combinations of the following options to find what works best for you.  Some management options include: Dietary changes (more leafy greens, vegetables and fruits; less processed foods) Fiber supplementation (Metamucil or something with psyllium as active ingredient)- Start a teaspoon and increase to a tablespoon daily Over-the-counter imodium (tablets or liquid) to help solidify the stool and prevent leakage of stool.   You have a stage 2 (out of 4) prolapse.  We discussed the fact that it is not life threatening but there are several treatment options. For treatment of pelvic organ prolapse, we discussed options for management including expectant management, conservative management, and surgical management, such as Kegels, a pessary, pelvic floor physical therapy, and specific surgical procedures.

## 2023-10-24 ENCOUNTER — Encounter: Payer: Self-pay | Admitting: Family Medicine

## 2023-10-24 ENCOUNTER — Other Ambulatory Visit: Payer: Self-pay | Admitting: Family Medicine

## 2023-10-24 DIAGNOSIS — I10 Essential (primary) hypertension: Secondary | ICD-10-CM

## 2023-10-28 ENCOUNTER — Ambulatory Visit: Payer: BC Managed Care – PPO | Admitting: Obstetrics and Gynecology

## 2023-11-06 ENCOUNTER — Ambulatory Visit: Payer: BC Managed Care – PPO | Admitting: Obstetrics and Gynecology

## 2023-11-08 ENCOUNTER — Ambulatory Visit (INDEPENDENT_AMBULATORY_CARE_PROVIDER_SITE_OTHER): Payer: BC Managed Care – PPO | Admitting: Obstetrics and Gynecology

## 2023-11-08 ENCOUNTER — Encounter: Payer: Self-pay | Admitting: Obstetrics and Gynecology

## 2023-11-08 VITALS — BP 130/85 | HR 81

## 2023-11-08 DIAGNOSIS — N812 Incomplete uterovaginal prolapse: Secondary | ICD-10-CM

## 2023-11-08 DIAGNOSIS — N393 Stress incontinence (female) (male): Secondary | ICD-10-CM | POA: Diagnosis not present

## 2023-11-08 DIAGNOSIS — N3281 Overactive bladder: Secondary | ICD-10-CM

## 2023-11-08 DIAGNOSIS — R159 Full incontinence of feces: Secondary | ICD-10-CM | POA: Diagnosis not present

## 2023-11-08 NOTE — Assessment & Plan Note (Signed)
-   Continue with Myrbetriq 50mg   - We also discussed the option of sacral nerve stimulation which can benefit both her OAB and bowel leakage symptoms. We discussed this involves a test phase and then placement of permanent wire in the OR. Handout provided for her to consider.

## 2023-11-08 NOTE — Assessment & Plan Note (Signed)
-   Not completely improved with metamucil. She is considering sacral nerve stimulation.

## 2023-11-08 NOTE — Assessment & Plan Note (Signed)
-   Reviewed options again for hysterectomy and prolapse repair. She is not sure if she wants to have surgery at this time.  - We also reviewed options of pelvic PT and pessary as well.

## 2023-11-08 NOTE — Assessment & Plan Note (Signed)
-   previously demonstrated SUI on exam. Will consider surgical treatment if she wants to proceed with prolapse repair

## 2023-11-08 NOTE — Progress Notes (Signed)
[facility] Return Visit  SUBJECTIVE  History of Present Illness: Pamela Francis is a 63 y.o. female seen in follow-up for mixed incontinence and prolapse. Plan at last visit was to start Myrbetriq 50mg . She was also considering surgery for prolapse.   Myrberiq has improved symptoms. Still has leakage every day but greatly improved. Most leakage is with urgency.   Using metamucil daily and still having daily bowel leakage.   Past Medical History: Patient  has a past medical history of Allergy, Anxiety, Arthritis, Blood in stool, Chicken pox, Depression, Frequent headaches, Hyperlipidemia, Hypertension, and Urine incontinence.   Past Surgical History: She  has a past surgical history that includes Tonsillectomy and adenoidectomy; Wrist surgery (Left); Colonoscopy; Breast biopsy (Left, 02/2005); and LEEP.   Medications: She has a current medication list which includes the following prescription(s): amitriptyline, calcium carb-cholecalciferol, vitamin d, cyanocobalamin, cyclobenzaprine, estradiol, fexofenadine, fluticasone, lisinopril, mirabegron er, omeprazole, sertraline, and simvastatin.   Allergies: Patient has no known allergies.   Social History: Patient  reports that she has never smoked. She has never been exposed to tobacco smoke. She has never used smokeless tobacco. She reports current alcohol use. She reports that she does not use drugs.     OBJECTIVE     Physical Exam: Vitals:   11/08/23 1115  BP: 130/85  Pulse: 81   Gen: No apparent distress, A&O x 3.  Detailed Urogynecologic Evaluation:  Deferred. Prior exam showed:      No data to display             ASSESSMENT AND PLAN    Pamela Francis is a 63 y.o. with:  1. Uterovaginal prolapse, incomplete   2. Overactive bladder   3. Incontinence of feces, unspecified fecal incontinence type   4. SUI (stress urinary incontinence, female)     Uterovaginal prolapse, incomplete Assessment & Plan: -  Reviewed options again for hysterectomy and prolapse repair. She is not sure if she wants to have surgery at this time.  - We also reviewed options of pelvic PT and pessary as well.    Overactive bladder Assessment & Plan: - Continue with Myrbetriq 50mg   - We also discussed the option of sacral nerve stimulation which can benefit both her OAB and bowel leakage symptoms. We discussed this involves a test phase and then placement of permanent wire in the OR. Handout provided for her to consider.    Incontinence of feces, unspecified fecal incontinence type Assessment & Plan: - Not completely improved with metamucil. She is considering sacral nerve stimulation.    SUI (stress urinary incontinence, female) Assessment & Plan: - previously demonstrated SUI on exam. Will consider surgical treatment if she wants to proceed with prolapse repair   Return 1 year or sooner if she wants to pursue other treatment options   Marguerita Beards, MD  Time spent: I spent 25 minutes dedicated to the care of this patient on the date of this encounter to include pre-visit review of records, face-to-face time with the patient and post visit documentation.

## 2023-11-11 ENCOUNTER — Encounter: Payer: Self-pay | Admitting: Neurology

## 2023-11-11 ENCOUNTER — Ambulatory Visit: Payer: BC Managed Care – PPO | Admitting: Neurology

## 2023-11-11 VITALS — BP 152/86 | HR 72 | Ht 63.0 in | Wt 175.5 lb

## 2023-11-11 DIAGNOSIS — R519 Headache, unspecified: Secondary | ICD-10-CM

## 2023-11-11 DIAGNOSIS — M542 Cervicalgia: Secondary | ICD-10-CM | POA: Diagnosis not present

## 2023-11-11 MED ORDER — SUMATRIPTAN SUCCINATE 50 MG PO TABS
ORAL_TABLET | ORAL | 6 refills | Status: AC
Start: 2023-11-11 — End: ?

## 2023-11-11 MED ORDER — NORTRIPTYLINE HCL 10 MG PO CAPS: 20.0000 mg | ORAL_CAPSULE | Freq: Every day | ORAL | 11 refills | Status: DC

## 2023-11-11 NOTE — Progress Notes (Signed)
 Chief Complaint  Patient presents with   Room 14    Pt is here Alone. Pt states that she will get a headache everyday. Pt states that she wakes up with headaches everyday. Pt denies any nausea or vomiting with her headaches. Pt states she has light and sound sensitivity with her headaches. Pt states that she has tension within her cervical with her headaches. Pt states that she has tension in her sinus area. Pt states that she has ringing in her ears. Pt states that she has a hard time concentrated with her headaches.       ASSESSMENT AND PLAN  Pamela Francis is a 63 y.o. female   Chronic daily headaches Chronic neck pain, urinary incontinence,  Hyperreflexia on examination, bilateral Hoffmann and Babinski signs careful gait  MRI of the brain and cervical spine to rule out structural abnormality  She certainly has medication overuse rebound headaches, stop daily over-the-counter medication use, nortriptyline titrating to 20 mg every night as preventive medication Imitrex 50 mg as needed  DIAGNOSTIC DATA (LABS, IMAGING, TESTING) - I reviewed patient records, labs, notes, testing and imaging myself where available.   MEDICAL HISTORY:  Pamela Francis, is a 63 year old female, seen in request by her primary care PA Saralyn Pilar for evaluation of daily headaches, chronic neck pain, initial evaluation was on November 11, 2023    History is obtained from the patient and review of electronic medical records. I personally reviewed pertinent available imaging films in PACS.   PMHx of  HTN HLD GERD Anxiety Urinary Incontinence on Myrbetriq  She works as a Interior and spatial designer at [employer], 8 hours each day, denies difficulty handling her job, but around 2023, she noticed mild unsteady gait, also developed slow worsening urinary frequency, to the point of incontinence have to wear pads daily, recently started on Myrbetriq, which has been helpful  Around the same time she began to  have daily headaches, woke up noticed the headache, can be at the frontal oftentimes occipital region as well, she take multiple doses of Tylenol or over-the-counter Excedrin Migraine Aleve as needed, yesterday she took 6 tablets of Tylenol around-the-clock to cover her headache, she has been doing that daily over the past couple years  She also complains of excessive sleepiness and fatigue, very tired after her work, tends to drifting to nap easily   PHYSICAL EXAM:   Vitals:   11/11/23 1523  BP: (!) 152/86  Pulse: 72  Weight: 175 lb 8 oz (79.6 kg)  Height: 5\' 3"  (1.6 m)       Body mass index is 31.09 kg/m.  PHYSICAL EXAMNIATION:  Gen: NAD, conversant, well nourised, well groomed                     Cardiovascular: Regular rate rhythm, no peripheral edema, warm, nontender. Eyes: Conjunctivae clear without exudates or hemorrhage Neck: Supple, no carotid bruits. Pulmonary: Clear to auscultation bilaterally   NEUROLOGICAL EXAM:  MENTAL STATUS: Speech/cognition: Awake, alert, oriented to history taking and casual conversation CRANIAL NERVES: CN II: Visual fields are full to confrontation. Pupils are round equal and briskly reactive to light. CN III, IV, VI: extraocular movement are normal. No ptosis. CN V: Facial sensation is intact to light touch CN VII: Face is symmetric with normal eye closure  CN VIII: Hearing is normal to causal conversation. CN IX, X: Phonation is normal. CN XI: Head turning and shoulder shrug are intact  MOTOR: There is no pronator drift of  out-stretched arms. Muscle bulk and tone are normal. Muscle strength is normal.  REFLEXES: Reflexes are 3 and symmetric at the biceps, triceps, knees, and ankles. Plantar responses are extensor bilaterally, bilateral Hoffmann signs,   SENSORY: Intact to light touch, pinprick and vibratory sensation are intact in fingers and toes.  COORDINATION: There is no trunk or limb dysmetria noted.  GAIT/STANCE:  Push-up to get up from seated position, cautious  REVIEW OF SYSTEMS:  Full 14 system review of systems performed and notable only for as above All other review of systems were negative.   ALLERGIES: No Known Allergies  HOME MEDICATIONS: Current Outpatient Medications  Medication Sig Dispense Refill   amitriptyline (ELAVIL) 10 MG tablet Take 1 tablet (10 mg total) by mouth at bedtime. May take instead of cyclobenzaprine at bedtime. 30 tablet 1   Calcium Carb-Cholecalciferol (CALCIUM 600 + D PO) Take 1 tablet by mouth daily.     Cholecalciferol (VITAMIN D PO) Take 2,000 tablets by mouth daily.     cyanocobalamin 1000 MCG tablet Take 1,000 mcg by mouth daily.     estradiol (ESTRACE) 0.1 MG/GM vaginal cream 1 gram vaginally twice weekly 42.5 g 4   fexofenadine (ALLEGRA) 180 MG tablet Take 180 mg by mouth daily.     fluticasone (FLONASE) 50 MCG/ACT nasal spray Place 2 sprays into both nostrils daily. 16 g 6   lisinopril (ZESTRIL) 2.5 MG tablet TAKE 1 TABLET(2.5 MG) BY MOUTH DAILY 90 tablet 0   mirabegron ER (MYRBETRIQ) 50 MG TB24 tablet Take 1 tablet (50 mg total) by mouth daily. 30 tablet 5   omeprazole (PRILOSEC) 40 MG capsule Take 40 mg by mouth daily.     sertraline (ZOLOFT) 50 MG tablet Take 75 mg (1.5 tablets) po QPM. May increase to 100 mg po QHS as indicated. 180 tablet 1   simvastatin (ZOCOR) 20 MG tablet TAKE 1 TABLET(20 MG) BY MOUTH DAILY AT 6 PM 90 tablet 0   cyclobenzaprine (FLEXERIL) 5 MG tablet Take 1-2 tablets (5-10 mg total) by mouth at bedtime. (Patient not taking: Reported on 11/11/2023) 30 tablet 1   No current facility-administered medications for this visit.    PAST MEDICAL HISTORY: Past Medical History:  Diagnosis Date   Allergy    Anxiety    Arthritis    Blood in stool    Chicken pox    Depression    Frequent headaches    Hyperlipidemia    Hypertension    Urine incontinence     PAST SURGICAL HISTORY: Past Surgical History:  Procedure Laterality Date    BREAST BIOPSY Left 02/2005   neg   COLONOSCOPY     Dr. Alba Dest[place], [place] 2013   LEEP     TONSILLECTOMY AND ADENOIDECTOMY     WRIST SURGERY Left     FAMILY HISTORY: Family History  Problem Relation Age of Onset   Arthritis Mother    Hyperlipidemia Mother    Hypertension Mother    Colon polyps Mother    Lung cancer Father 51       smoking hx   Prostate cancer Maternal Uncle 78   Hodgkin's lymphoma Maternal Uncle 74   Prostate cancer Maternal Uncle 66       mets   Cancer Paternal Aunt        x3 pat aunts; unknown cancer; dx unknown age   Cancer Paternal Uncle        unknown cancer; dx unknown age   Rheum arthritis Maternal Grandmother  Stroke Maternal Grandmother    Alcohol abuse Maternal Grandfather    Breast cancer Cousin 50       bilateral; mat female cousin   Cancer - Other Cousin        PALB2 gene mutation   Breast cancer Cousin 46       triple negative; mat female cousin   Ovarian cancer Cousin 11       d. 12   Lung cancer Cousin 1       mat female cousin   Breast cancer Other        MGF's sister; d. 68s   Hodgkin's lymphoma Half-Sister 76       d. 49; pat half sister   Colon cancer Neg Hx    Esophageal cancer Neg Hx    Pancreatic cancer Neg Hx    Stomach cancer Neg Hx    Rectal cancer Neg Hx    Liver cancer Neg Hx     SOCIAL HISTORY: Social History   Socioeconomic History   Marital status: Married    Spouse name: ALLEN Ivanov   Number of children: Not on file   Years of education: Not on file   Highest education level: Associate degree: occupational, Scientist, product/process development, or vocational program  Occupational History   Not on file  Tobacco Use   Smoking status: Never    Passive exposure: Never   Smokeless tobacco: Never  Vaping Use   Vaping status: Never Used  Substance and Sexual Activity   Alcohol use: Yes    Alcohol/week: 0.0 standard drinks of alcohol    Comment: occasional   Drug use: No   Sexual activity: Yes    Birth control/protection:  Post-menopausal  Other Topics Concern   Not on file  Social History Narrative   Not on file   Social Drivers of Health   Financial Resource Strain: Low Risk  (07/08/2023)   Overall Financial Resource Strain (CARDIA)    Difficulty of Paying Living Expenses: Not very hard  Food Insecurity: No Food Insecurity (07/08/2023)   Hunger Vital Sign    Worried About Running Out of Food in the Last Year: Never true    Ran Out of Food in the Last Year: Never true  Transportation Needs: No Transportation Needs (07/08/2023)   PRAPARE - Administrator, Civil Service (Medical): No    Lack of Transportation (Non-Medical): No  Physical Activity: Insufficiently Active (07/08/2023)   Exercise Vital Sign    Days of Exercise per Week: 4 days    Minutes of Exercise per Session: 30 min  Stress: Stress Concern Present (07/08/2023)   [employer] of Occupational Health - Occupational Stress Questionnaire    Feeling of Stress : Rather much  Social Connections: Moderately Integrated (07/08/2023)   Social Connection and Isolation Panel [NHANES]    Frequency of Communication with Friends and Family: More than three times a week    Frequency of Social Gatherings with Friends and Family: Three times a week    Attends Religious Services: 1 to 4 times per year    Active Member of Clubs or Organizations: No    Attends Banker Meetings: Not on file    Marital Status: Married  Catering manager Violence: Not on file      Levert Feinstein, M.D. Ph.D.  [facility] [address] [place], [place] [address] Ph: [phone] Fax: [phone]  CC:  [name], PA [address] [place],  [place] [address]  [name], PA

## 2023-11-19 ENCOUNTER — Ambulatory Visit: Payer: BC Managed Care – PPO

## 2023-11-19 DIAGNOSIS — R519 Headache, unspecified: Secondary | ICD-10-CM | POA: Diagnosis not present

## 2023-11-19 DIAGNOSIS — M542 Cervicalgia: Secondary | ICD-10-CM

## 2023-11-21 ENCOUNTER — Encounter: Payer: Self-pay | Admitting: Neurology

## 2024-01-28 ENCOUNTER — Other Ambulatory Visit: Payer: Self-pay | Admitting: Family Medicine

## 2024-01-28 DIAGNOSIS — I10 Essential (primary) hypertension: Secondary | ICD-10-CM

## 2024-01-28 DIAGNOSIS — E782 Mixed hyperlipidemia: Secondary | ICD-10-CM

## 2024-02-03 ENCOUNTER — Other Ambulatory Visit

## 2024-02-25 ENCOUNTER — Ambulatory Visit
Admission: RE | Admit: 2024-02-25 | Discharge: 2024-02-25 | Disposition: A | Discharge status: Home / Self Care | Source: Ambulatory Visit | Attending: Certified Nurse Midwife | Admitting: Certified Nurse Midwife

## 2024-02-25 DIAGNOSIS — M8580 Other specified disorders of bone density and structure, unspecified site: Secondary | ICD-10-CM | POA: Insufficient documentation

## 2024-02-25 DIAGNOSIS — M85852 Other specified disorders of bone density and structure, left thigh: Secondary | ICD-10-CM | POA: Diagnosis not present

## 2024-02-25 DIAGNOSIS — Z780 Asymptomatic menopausal state: Secondary | ICD-10-CM | POA: Diagnosis not present

## 2024-02-25 DIAGNOSIS — M85851 Other specified disorders of bone density and structure, right thigh: Secondary | ICD-10-CM | POA: Diagnosis not present

## 2024-03-02 ENCOUNTER — Ambulatory Visit (HOSPITAL_BASED_OUTPATIENT_CLINIC_OR_DEPARTMENT_OTHER): Payer: Self-pay | Admitting: Obstetrics & Gynecology

## 2024-03-05 ENCOUNTER — Encounter: Payer: Self-pay | Admitting: Dermatology

## 2024-03-05 ENCOUNTER — Ambulatory Visit: Admitting: Dermatology

## 2024-03-05 VITALS — BP 130/83 | HR 90

## 2024-03-05 DIAGNOSIS — L821 Other seborrheic keratosis: Secondary | ICD-10-CM

## 2024-03-05 DIAGNOSIS — D1801 Hemangioma of skin and subcutaneous tissue: Secondary | ICD-10-CM

## 2024-03-05 DIAGNOSIS — L814 Other melanin hyperpigmentation: Secondary | ICD-10-CM

## 2024-03-05 DIAGNOSIS — W908XXA Exposure to other nonionizing radiation, initial encounter: Secondary | ICD-10-CM

## 2024-03-05 DIAGNOSIS — Z1283 Encounter for screening for malignant neoplasm of skin: Secondary | ICD-10-CM

## 2024-03-05 DIAGNOSIS — L578 Other skin changes due to chronic exposure to nonionizing radiation: Secondary | ICD-10-CM | POA: Diagnosis not present

## 2024-03-05 DIAGNOSIS — D229 Melanocytic nevi, unspecified: Secondary | ICD-10-CM

## 2024-03-05 NOTE — Progress Notes (Signed)
   New Patient Visit   Subjective  Pamela Francis is a 64 y.o. female who presents for the following: Skin Cancer Screening and Full Body Skin Exam  The patient presents for Total-Body Skin Exam (TBSE) for skin cancer screening and mole check. The patient has spots, moles and lesions to be evaluated, some may be new or changing.  Pt has no hx of skin cancer no family hx of MM  The following portions of the chart were reviewed this encounter and updated as appropriate: medications, allergies, medical history  Review of Systems:  No other skin or systemic complaints except as noted in HPI or Assessment and Plan.  Objective  Well appearing patient in no apparent distress; mood and affect are within normal limits.  A full examination was performed including scalp, head, eyes, ears, nose, lips, neck, chest, axillae, abdomen, back, buttocks, bilateral upper extremities, bilateral lower extremities, hands, feet, fingers, toes, fingernails, and toenails. All findings within normal limits unless otherwise noted below.   Relevant physical exam findings are noted in the Assessment and Plan.    Assessment & Plan   SKIN CANCER SCREENING PERFORMED TODAY.  ACTINIC DAMAGE - Chronic condition, secondary to cumulative UV/sun exposure - diffuse scaly erythematous macules with underlying dyspigmentation - Recommend daily broad spectrum sunscreen SPF 30+ to sun-exposed areas, reapply every 2 hours as needed.  - Staying in the shade or wearing long sleeves, sun glasses (UVA+UVB protection) and wide brim hats (4-inch brim around the entire circumference of the hat) are also recommended for sun protection.  - Call for new or changing lesions.  MELANOCYTIC NEVI - Tan-brown and/or pink-flesh-colored symmetric macules and papules - Benign appearing on exam today - Observation - Call clinic for new or changing moles - Recommend daily use of broad spectrum spf 30+ sunscreen to sun-exposed areas.    LENTIGINES Exam: scattered tan macules Due to sun exposure Treatment Plan: Benign-appearing, observe. Recommend daily broad spectrum sunscreen SPF 30+ to sun-exposed areas, reapply every 2 hours as needed.  Call for any changes   HEMANGIOMA Exam: red papule(s) Discussed benign nature. Recommend observation. Call for changes.   SEBORRHEIC KERATOSIS - Stuck-on, waxy, tan-brown papules and/or plaques  - Benign-appearing - Discussed benign etiology and prognosis. - Observe - Call for any changes  Return in about 2 years (around 03/05/2026) for TBSE.  I, Wilson Hasten, CMA, am acting as scribe for Deneise Finlay, MD.   Documentation: I have reviewed the above documentation for accuracy and completeness, and I agree with the above.  Deneise Finlay, MD

## 2024-03-05 NOTE — Patient Instructions (Addendum)

## 2024-03-10 ENCOUNTER — Ambulatory Visit

## 2024-04-01 ENCOUNTER — Encounter: Payer: Self-pay | Admitting: Neurology

## 2024-04-01 ENCOUNTER — Ambulatory Visit: Payer: BC Managed Care – PPO | Admitting: Neurology

## 2024-04-01 VITALS — BP 138/85 | HR 87 | Ht 63.0 in | Wt 169.5 lb

## 2024-04-01 DIAGNOSIS — R519 Headache, unspecified: Secondary | ICD-10-CM

## 2024-04-01 DIAGNOSIS — M542 Cervicalgia: Secondary | ICD-10-CM

## 2024-04-01 MED ORDER — NORTRIPTYLINE HCL 10 MG PO CAPS: 30.0000 mg | ORAL_CAPSULE | Freq: Every day | ORAL | 5 refills | Status: AC

## 2024-04-01 NOTE — Progress Notes (Signed)
 Chief Complaint  Patient presents with   Headache    Rm13, alone, Ha: pt stated that she has ha's daily. Triggers: lights, sounds, food    Neck Pain    Rm13, alone, Neck pain: pt stated thather neck pain is progressively worsening. She did a massage yesterday and helped slightly      ASSESSMENT AND PLAN  Pamela Francis is a 63 y.o. female   Chronic daily headaches Chronic neck pain, urinary incontinence  - Increase nortriptyline  30 mg at bedtime - Referral to physical therapy, neuromuscular therapy for neck pain - MRI of the brain was unremarkable - MRI cervical spine showed multilevel degenerative changes with moderate to severe foraminal stenosis C3-4 down to C7-T1.  No spinal stenosis or cord signal abnormality. - Continue Imitrex  50 mg as needed for acute headache - Next steps: Gabapentin - Follow-up in 6 months  DIAGNOSTIC DATA (LABS, IMAGING, TESTING) - I reviewed patient records, labs, notes, testing and imaging myself where available.  MRI cervical spine without contrast demonstrating:  -Multilevel disc bulging and facet hypertrophy with moderate to severe  foraminal stenosis from C3-4 down to C7-T1 as above.  -No spinal stenosis or cord signal abnormality.   Unremarkable MRI brain (without). No acute findings.     MEDICAL HISTORY:  Pamela Francis, is a 63 year old female, seen in request by her primary care PA Wallace Search for evaluation of daily headaches, chronic neck pain, initial evaluation was on November 11, 2023    History is obtained from the patient and review of electronic medical records. I personally reviewed pertinent available imaging films in PACS.   PMHx of  HTN HLD GERD Anxiety Urinary Incontinence on Myrbetriq   She works as a Interior and spatial designer at [employer], 8 hours each day, denies difficulty handling her job, but around 2023, she noticed mild unsteady gait, also developed slow worsening urinary frequency, to the point of  incontinence have to wear pads daily, recently started on Myrbetriq , which has been helpful  Around the same time she began to have daily headaches, woke up noticed the headache, can be at the frontal oftentimes occipital region as well, she take multiple doses of Tylenol or over-the-counter Excedrin Migraine Aleve as needed, yesterday she took 6 tablets of Tylenol around-the-clock to cover her headache, she has been doing that daily over the past couple years  She also complains of excessive sleepiness and fatigue, very tired after her work, tends to drifting to nap easily  Update April 01, 2024 SS: MRI brain was unremarkable. MRI cervical spine showing multilevel degenerative changes.  No evidence of spinal cord compression, variable degree of foraminal narrowing. Reports daily headache, there in the morning. In the occipital area, around her neck, dull. Doesn't snore. Will localize frontal area. Sometimes sensitive to light or sound, nausea only few times a month. Taking Tylenol 1-2 a day. On nortriptyline  20 mg at bedtime, hasn't helped with headache, but sleeping better. Only took Imitrex  few times when severe with good benefit. Went to GYN urology, started on Myrbetriq , helped with urgency.   MRI cervical spine without contrast demonstrating: -Multilevel disc bulging and facet hypertrophy with moderate to severe foraminal stenosis from C3-4 down to C7-T1 as above. -No spinal stenosis or cord signal abnormality.  PHYSICAL EXAM:   Vitals:   04/01/24 1550  BP: 138/85  Pulse: 87  SpO2: 98%  Weight: 169 lb 8 oz (76.9 kg)  Height: 5' 3 (1.6 m)   Body mass index is 30.03 kg/m.  PHYSICAL  EXAMNIATION:  Gen: NAD, conversant, well nourised, well groomed                      NEUROLOGICAL EXAM:  MENTAL STATUS: Speech/cognition: Awake, alert, oriented to history taking and casual conversation CRANIAL NERVES: CN II: Visual fields are full to confrontation. Pupils are round equal and briskly  reactive to light. CN III, IV, VI: extraocular movement are normal. No ptosis. CN V: Facial sensation is intact to light touch CN VII: Face is symmetric with normal eye closure  CN VIII: Hearing is normal to causal conversation. CN IX, X: Phonation is normal. CN XI: Head turning and shoulder shrug are intact  MOTOR: There is no pronator drift of out-stretched arms. Muscle bulk and tone are normal. Muscle strength is normal.  REFLEXES: Reflexes are 3 at the knees   SENSORY: Intact to light touch  COORDINATION: There is no trunk or limb dysmetria noted.  GAIT/STANCE: Steady, independent  REVIEW OF SYSTEMS:  Full 14 system review of systems performed and notable only for as above All other review of systems were negative.   ALLERGIES: No Known Allergies  HOME MEDICATIONS: Current Outpatient Medications  Medication Sig Dispense Refill   Calcium Carb-Cholecalciferol (CALCIUM 600 + D PO) Take 1 tablet by mouth daily.     Cholecalciferol (VITAMIN D  PO) Take 2,000 tablets by mouth daily.     cyanocobalamin 1000 MCG tablet Take 1,000 mcg by mouth daily.     cyclobenzaprine  (FLEXERIL ) 5 MG tablet Take 1-2 tablets (5-10 mg total) by mouth at bedtime. 30 tablet 1   estradiol  (ESTRACE ) 0.1 MG/GM vaginal cream 1 gram vaginally twice weekly 42.5 g 4   fexofenadine (ALLEGRA) 180 MG tablet Take 180 mg by mouth daily.     fluticasone  (FLONASE ) 50 MCG/ACT nasal spray Place 2 sprays into both nostrils daily. 16 g 6   lisinopril  (ZESTRIL ) 2.5 MG tablet TAKE 1 TABLET(2.5 MG) BY MOUTH DAILY 90 tablet 0   mirabegron  ER (MYRBETRIQ ) 50 MG TB24 tablet Take 1 tablet (50 mg total) by mouth daily. 30 tablet 5   nortriptyline  (PAMELOR ) 10 MG capsule Take 2 capsules (20 mg total) by mouth at bedtime. 60 capsule 11   sertraline  (ZOLOFT ) 50 MG tablet Take 75 mg (1.5 tablets) po QPM. May increase to 100 mg po QHS as indicated. 180 tablet 1   simvastatin  (ZOCOR ) 20 MG tablet TAKE 1 TABLET(20 MG) BY MOUTH  DAILY AT 6 PM 90 tablet 0   SUMAtriptan  (IMITREX ) 50 MG tablet May repeat in 2 hours if headache persists or recurs. 10 tablet 6   No current facility-administered medications for this visit.    PAST MEDICAL HISTORY: Past Medical History:  Diagnosis Date   Allergy    Anxiety    Arthritis    Blood in stool    Chicken pox    Depression    Frequent headaches    Hyperlipidemia    Hypertension    Urine incontinence     PAST SURGICAL HISTORY: Past Surgical History:  Procedure Laterality Date   BREAST BIOPSY Left 02/2005   neg   COLONOSCOPY     Dr. Tamea Holland, [place] 2013   LEEP     TONSILLECTOMY AND ADENOIDECTOMY     WRIST SURGERY Left     FAMILY HISTORY: Family History  Problem Relation Age of Onset   Arthritis Mother    Hyperlipidemia Mother    Hypertension Mother    Colon polyps Mother  Lung cancer Father 23       smoking hx   Prostate cancer Maternal Uncle 69   Hodgkin's lymphoma Maternal Uncle 74   Prostate cancer Maternal Uncle 66       mets   Cancer Paternal Aunt        x3 pat aunts; unknown cancer; dx unknown age   Cancer Paternal Uncle        unknown cancer; dx unknown age   Rheum arthritis Maternal Grandmother    Stroke Maternal Grandmother    Alcohol abuse Maternal Grandfather    Breast cancer Cousin 50       bilateral; mat female cousin   Cancer - Other Cousin        PALB2 gene mutation   Breast cancer Cousin 70       triple negative; mat female cousin   Ovarian cancer Cousin 11       d. 60   Lung cancer Cousin 78       mat female cousin   Breast cancer Other        MGF's sister; d. 54s   Hodgkin's lymphoma Half-Sister 26       d. 58; pat half sister   Colon cancer Neg Hx    Esophageal cancer Neg Hx    Pancreatic cancer Neg Hx    Stomach cancer Neg Hx    Rectal cancer Neg Hx    Liver cancer Neg Hx     SOCIAL HISTORY: Social History   Socioeconomic History   Marital status: Married    Spouse name: ALLEN Karen   Number of  children: Not on file   Years of education: Not on file   Highest education level: Associate degree: occupational, Scientist, product/process development, or vocational program  Occupational History   Not on file  Tobacco Use   Smoking status: Never    Passive exposure: Never   Smokeless tobacco: Never  Vaping Use   Vaping status: Never Used  Substance and Sexual Activity   Alcohol use: Yes    Alcohol/week: 0.0 standard drinks of alcohol    Comment: occasional   Drug use: No   Sexual activity: Yes    Birth control/protection: Post-menopausal  Other Topics Concern   Not on file  Social History Narrative   Not on file   Social Drivers of Health   Financial Resource Strain: Low Risk  (07/08/2023)   Overall Financial Resource Strain (CARDIA)    Difficulty of Paying Living Expenses: Not very hard  Food Insecurity: No Food Insecurity (07/08/2023)   Hunger Vital Sign    Worried About Running Out of Food in the Last Year: Never true    Ran Out of Food in the Last Year: Never true  Transportation Needs: No Transportation Needs (07/08/2023)   PRAPARE - Administrator, Civil Service (Medical): No    Lack of Transportation (Non-Medical): No  Physical Activity: Insufficiently Active (07/08/2023)   Exercise Vital Sign    Days of Exercise per Week: 4 days    Minutes of Exercise per Session: 30 min  Stress: Stress Concern Present (07/08/2023)   [employer] of Occupational Health - Occupational Stress Questionnaire    Feeling of Stress : Rather much  Social Connections: Moderately Integrated (07/08/2023)   Social Connection and Isolation Panel    Frequency of Communication with Friends and Family: More than three times a week    Frequency of Social Gatherings with Friends and Family: Three times a week  Attends Religious Services: 1 to 4 times per year    Active Member of Clubs or Organizations: No    Attends Banker Meetings: Not on file    Marital Status: Married  Careers information officer Violence: Not on file   Lauraine Born, SCHARLENE, DNP  [facility] [address] [place], [place] [address] [phone]

## 2024-04-01 NOTE — Patient Instructions (Signed)
 Increase nortriptyline  30 mg at bedtime.  Recommend massage, heat for your neck pain.  Referral to physical therapy.  Follow-up in 6 months.  Thanks

## 2024-04-07 ENCOUNTER — Ambulatory Visit: Attending: Neurology

## 2024-04-07 DIAGNOSIS — M542 Cervicalgia: Secondary | ICD-10-CM | POA: Insufficient documentation

## 2024-04-07 DIAGNOSIS — R519 Headache, unspecified: Secondary | ICD-10-CM | POA: Insufficient documentation

## 2024-04-07 DIAGNOSIS — G4486 Cervicogenic headache: Secondary | ICD-10-CM | POA: Insufficient documentation

## 2024-04-07 NOTE — Therapy (Signed)
 OUTPATIENT PHYSICAL THERAPY CERVICAL EVALUATION   Patient Name: Pamela Francis MRN: [id number] DOB:August 02, 1961, 63 y.o., female Today's Date: 04/07/2024  END OF SESSION:  PT End of Session - 04/07/24 0741     Visit Number 1    Number of Visits 17    Date for PT Re-Evaluation 06/05/24    PT Start Time 0742    PT Stop Time 0821    PT Time Calculation (min) 39 min    Activity Tolerance Patient limited by pain;Patient tolerated treatment well    Behavior During Therapy [facility] for tasks assessed/performed          Past Medical History:  Diagnosis Date   Allergy    Anxiety    Arthritis    Blood in stool    Chicken pox    Depression    Frequent headaches    Hyperlipidemia    Hypertension    Urine incontinence    Past Surgical History:  Procedure Laterality Date   BREAST BIOPSY Left 02/2005   neg   COLONOSCOPY     Dr. Tamea Holland, [place] 2013   LEEP     TONSILLECTOMY AND ADENOIDECTOMY     WRIST SURGERY Left    Patient Active Problem List   Diagnosis Date Noted   Neck pain 11/11/2023   Daily headache 11/11/2023   Overactive bladder 09/30/2023   Uterovaginal prolapse, incomplete 09/30/2023   Chronic idiopathic constipation 09/30/2023   Incontinence of feces 09/30/2023   Dysplasia of cervix, low grade (CIN 1) 07/01/2023   Vaginal atrophy 07/01/2023   Mixed incontinence urge and stress 07/01/2023   Osteopenia after menopause 07/01/2023   H/O LEEP 07/01/2023   Impaired fasting glucose 03/04/2023   Vitamin D  deficiency 03/04/2023   Other fatigue 03/04/2023   Genetic testing 11/22/2022   Essential hypertension 08/19/2022   Tinnitus of both ears 08/19/2022   High grade squamous intraepithelial lesion (HGSIL), grade 3 CIN, on biopsy of cervix 07/11/2022   History of loop electrical excision procedure (LEEP) 07/11/2022   Chronic bilateral low back pain with bilateral sciatica 12/31/2021   SUI (stress urinary incontinence, female) 12/31/2021   Body mass index  28.0-28.9, adult 12/31/2021   Osteoarthritis 11/06/2018   Mixed hyperlipidemia 12/20/2014   Anxiety and depression 12/20/2014   Muscle tension headache 12/20/2014    PCP: Gayle Saddie [name]   REFERRING PROVIDER: Gayland Lauraine PARAS, NP  REFERRING DIAG: M54.2 (ICD-10-CM) - Neck pain R51.9 (ICD-10-CM) - Daily headache M54.2 (ICD-10-CM) - Cervicalgia  THERAPY DIAG:  Cervicalgia - Plan: PT plan of care cert/re-cert  Cervicogenic headache - Plan: PT plan of care cert/re-cert  Rationale for Evaluation and Treatment: Rehabilitation  ONSET DATE: 04/01/2024 (Date PT referral signed. Chronic condition)  SUBJECTIVE:  SUBJECTIVE STATEMENT: See pertinent history.    Hand dominance: Left  PERTINENT HISTORY:  Cervicalgia, headaches. Gradual onset, for a while. Pamela Francis also states feeling B UE paresthesias to her 5th digits (along the ulnar nerve/C8/T1 dermatome). Headaches located suboccipital area and posterior neck as well as the sinus areas.    No latex allergies.       PAIN:  Are you having pain? Yes: NPRS scale: 7/10 currently neck pain and headache.  Pain location: posterior neck, suboccipital area and B upper trap area Pain description: tight, sore Aggravating factors: cervical rotation R and L, cervical flexion and extension.  Relieving factors: heat  PRECAUTIONS: OSTEOPENIA  RED FLAGS: Bowel or bladder incontinence: Yes: bladder (doctor aware) and Cauda equina syndrome: No     WEIGHT BEARING RESTRICTIONS: No  FALLS:  Has patient fallen in last 6 months? No  LIVING ENVIRONMENT: Lives with: lives with their spouse Lives in: House/apartment Stairs: Yes: External: 3 steps; on left going up Has following equipment at home: None  OCCUPATION: Hair Dresser at [employer]  PLOF: Independent  PATIENT GOALS: move her neck without it hurting and get rid of the headaches.   NEXT MD VISIT: January 2026  OBJECTIVE:  Note: Objective measures were completed at Evaluation unless otherwise noted.  DIAGNOSTIC FINDINGS:  MR CERVICAL SPINE WO CONTRAST 11/19/2023  Narrative & Impression  [facility]   NEUROIMAGING REPORT     STUDY DATE: 11/19/23 PATIENT NAME: Pamela Francis DOB: 1961-05-09 MRN: [id number]   ORDERING CLINICIAN: Onita Duos, MD  CLINICAL HISTORY: 63 y.o. year old female with: 1. Neck pain   2. Daily headache   3. Cervicalgia       EXAM: MR CERVICAL SPINE WO CONTRAST  TECHNIQUE: MRI of the cervical spine was obtained utilizing multiplanar, multiecho pulse sequences. CONTRAST:  Diagnostic Product Medications (last 72 hours)       None         COMPARISON: none   IMAGING SITE: [facility] [address]     [address] [place] [address] [phone]       FINDINGS:    On sagittal views the vertebral bodies have normal height and alignment.  The spinal cord is normal in size and appearance. The posterior fossa, pituitary gland and paraspinal soft tissues are unremarkable.     On axial views: C2-3 disc bulging with no spinal stenosis or foraminal narrowing C3-4 disc bulging facet hypertrophy with severe bilateral foraminal stenosis C4-5 this bulging and facet artery with moderate right and mild left foraminal stenosis C5-6 disc bulging and facet hypertrophy with severe right and moderate left foraminal stenosis C6-7 disc bulging and facet hypertrophy with severe left foraminal stenosis C7-T1 disc bulging and facet hypertrophy with moderate bilateral foraminal stenosis   Limited views of the soft tissues of the head and neck are unremarkable.     IMPRESSION:    MRI cervical spine without contrast demonstrating: -Multilevel disc  bulging and facet hypertrophy with moderate to severe foraminal stenosis from C3-4 down to C7-T1 as above. -No spinal stenosis or cord signal abnormality.       INTERPRETING PHYSICIAN:  EDUARD FABIENE HANLON, MD Certified in Neurology, Neurophysiology and Neuroimaging   [facility] [address], [address] [place], [place] [address] [phone]      MR BRAIN WO CONTRAST 11/19/2023  Narrative & Impression  [facility]   NEUROIMAGING REPORT     STUDY DATE: 11/19/23 PATIENT  NAME: Stashia Sia DOB: June 11, 1961 MRN: [id number]   ORDERING CLINICIAN: Onita Duos, MD  CLINICAL HISTORY: 63 y.o. year old female with: 1. Neck pain   2. Daily headache   3. Cervicalgia       EXAM: MR BRAIN WO CONTRAST  TECHNIQUE: MRI of the brain without contrast was obtained utilizing multiplanar, multiecho pulse sequences. CONTRAST:  Diagnostic Product Medications (last 72 hours)       None         COMPARISON: none   IMAGING SITE: [facility] [address]     [address] [place] [address] [phone]       FINDINGS:    No abnormal lesions are seen on diffusion-weighted views to suggest acute ischemia. The cortical sulci, fissures and cisterns are normal in size and appearance. Lateral, third and fourth ventricle are normal in size and appearance. No extra-axial fluid collections are seen. No evidence of mass effect or midline shift.  Minimal punctate periventricular subcortical foci of nonspecific T2 hyperintensities.       On sagittal views the posterior fossa, pituitary gland and corpus callosum are unremarkable. No evidence of intracranial hemorrhage on gradient-echo views. The orbits and their contents, paranasal sinuses and calvarium are unremarkable.  Intracranial flow voids are present.     IMPRESSION:    Unremarkable MRI brain (without). No acute findings.           INTERPRETING PHYSICIAN:  EDUARD FABIENE HANLON, MD Certified in Neurology, Neurophysiology and Neuroimaging   [facility] [address], [address] [place], [place] [address] [phone]        PATIENT SURVEYS:  NDI:  NECK DISABILITY INDEX  Date: 04/07/2024 Score  Pain intensity 3 = The pain is fairly severe at the moment  2. Personal care (washing, dressing, etc.) 0 = I can look after myself normally without causing extra pain  3. Lifting 1 =  I can lift heavy weights but it gives extra pain  4. Reading 2 =  I can read as much as I want with moderate pain in my neck  5. Headaches 4 = I have severe headaches, which come frequently   6. Concentration 1 =  I can concentrate fully when I want to with slight difficulty   7. Work 2 = I can do most of my usual work, but no more  8. Driving 1 =  I can drive my car as long as I want with slight pain in my neck  9. Sleeping 2 = My sleep is mildly disturbed (1-2 hrs sleepless)  10. Recreation 2 = I am able to engage in most, but not all of my usual recreation activities because of   pain in my neck  Total 18/50 (36%)   Minimum Detectable Change (90% confidence): 5 points or 10% points  COGNITION: Overall cognitive status: Within functional limits for tasks assessed  SENSATION:   POSTURE: forward neck, B protracted shoulders, slight L trunk lateral shift, slight upper thoracic kyphosis, R shoulder lower, movement crease around C5/6 and C6/7 areas  PALPATION: TTP suboccipital area and upper cervical spine. Muscle tension suboccipital area and B cervical paraspinal, and B upper trap area.    CERVICAL ROM:   Active ROM A/PROM (deg) eval  Flexion WFL with posterior cervical pulling  Extension Limited with B scalene and sternocleidomastoid muscle area pain.   Right lateral flexion WFL with R lateral neck pain  Left lateral flexion WFL with L  lateral neck pain  Right rotation 55 with L posterior lateral neck pain to  shoulder (50 in supine)  Left rotation 30 with L posterior lateral neck pain > R posterior lateral neck pain. (53 in supine with L cervical side bend)   (Blank rows = not tested)  UPPER EXTREMITY ROM:  Active ROM Right eval Left eval  Shoulder flexion    Shoulder extension    Shoulder abduction    Shoulder adduction    Shoulder extension    Shoulder internal rotation    Shoulder external rotation    Elbow flexion    Elbow extension    Wrist flexion    Wrist extension    Wrist ulnar deviation    Wrist radial deviation    Wrist pronation    Wrist supination     (Blank rows = not tested)  UPPER EXTREMITY MMT:  MMT Right eval Left eval  Shoulder flexion 4 4-  Shoulder extension 4 4  Shoulder abduction    Shoulder adduction    Shoulder extension    Shoulder internal rotation    Shoulder external rotation    Middle trapezius    Lower trapezius    Elbow flexion 4- 4-  Elbow extension 4 4-  Wrist flexion    Wrist extension 4 4-  Wrist ulnar deviation    Wrist radial deviation    Wrist pronation    Wrist supination    Grip strength     (Blank rows = not tested)  CERVICAL SPECIAL TESTS:    FUNCTIONAL TESTS:    TREATMENT DATE: 04/07/2024                                                                                                                               Blood pressure L arm sitting, normal cuff, mechanically taken: 151/78, HR 75  Manual therapy Supine STM suboccipital area to decrease tension    PATIENT EDUCATION:  Education details: POC Person educated: Patient Education method: Explanation Education comprehension: verbalized understanding  HOME EXERCISE PROGRAM:   ASSESSMENT:  CLINICAL IMPRESSION: Patient is a 63 y.o. female who was seen today for physical therapy evaluation and treatment for neck pain and headaches. She also demonstrates forward neck, upper cervical extension, upper thoracic flexion, and B protracted shoulder posture,  altered cervical mechanics, TTP to posterior neck and suboccipital areas, muscle tension to B upper trap, suboccipital and cervical paraspinal areas, reproduction of neck pain with cervical AROM, and difficulty performing tasks which involves looking around secondary to neck pain and headaches. Pt will benefit from skilled physical therapy services to address the aforementioned deficits.    OBJECTIVE IMPAIRMENTS: decreased ROM, decreased strength, improper body mechanics, postural dysfunction, and pain.   ACTIVITY LIMITATIONS: looking around  PARTICIPATION LIMITATIONS:   PERSONAL FACTORS: Fitness, Past/current experiences, Profession, Time since onset of injury/illness/exacerbation, and 3+ comorbidities: anxiety, depression, HTN are also affecting patient's functional outcome.   REHAB POTENTIAL: Fair  CLINICAL DECISION MAKING: Stable/uncomplicated  EVALUATION COMPLEXITY: Low   GOALS: Goals reviewed with patient? Yes  SHORT TERM GOALS: Target date: 04/17/2024  Pt will be independent with her initial HEP to decrease pain, improve cervical posture, UE strength and function.  Baseline: Pt has not yet started her initial HEP (04/07/2024) Goal status: INITIAL  LONG TERM GOALS: Target date: 06/05/2024  Pt will have a decrease in neck pain at worst to 3/10 or less to promote ability to look around, perform her job more comfortably.  Baseline: At least 7/10 at worst for the past 3 months (04/07/2024) Goal status: INITIAL  2.  Pt will improve her Neck Disability Index Score by at least 10% as a demonstration of improved function.  Baseline: Neck Disability Index Score 18/50 (36%) (04/07/2024) Goal status: INITIAL  3. Pt will improve R and L cervical rotation AROM in the upright position by at least 10 degrees to promote ability to look around with less difficulty.  Baseline:  Active ROM A/PROM (deg) eval  Right rotation 55 with L posterior lateral neck pain to shoulder (50 in supine)  Left  rotation 30 with L posterior lateral neck pain > R posterior lateral neck pain. (53 in supine with L cervical side bend)   Goal status: INITIAL    PLAN:  PT FREQUENCY: 1-2x/week  PT DURATION: 8 weeks  PLANNED INTERVENTIONS: 97110-Therapeutic exercises, 97530- Therapeutic activity, 97112- Neuromuscular re-education, 97535- Self Care, 02859- Manual therapy, [id number]- Electrical stimulation (unattended), [phone]- Traction (mechanical), [id number]- Ionotophoresis 4mg /ml Dexamethasone, Patient/Family education, Joint mobilization, and Spinal mobilization  PLAN FOR NEXT SESSION: posture, scapular and anterior cervical strengthening, cervical and scapular mechanics, manual technique, modalities PRN   Aarron Wierzbicki, PT, DPT 04/07/2024, 8:55 AM

## 2024-04-13 ENCOUNTER — Ambulatory Visit

## 2024-04-13 VITALS — BP 138/85 | HR 80 | Temp 97.7°F | Ht 63.0 in | Wt 165.1 lb

## 2024-04-13 DIAGNOSIS — I10 Essential (primary) hypertension: Secondary | ICD-10-CM

## 2024-04-13 DIAGNOSIS — R5383 Other fatigue: Secondary | ICD-10-CM | POA: Diagnosis not present

## 2024-04-13 DIAGNOSIS — G44209 Tension-type headache, unspecified, not intractable: Secondary | ICD-10-CM

## 2024-04-13 DIAGNOSIS — Z8261 Family history of arthritis: Secondary | ICD-10-CM

## 2024-04-13 DIAGNOSIS — M2550 Pain in unspecified joint: Secondary | ICD-10-CM | POA: Insufficient documentation

## 2024-04-13 NOTE — Patient Instructions (Addendum)
 It was nice to see you today!  As we discussed in clinic:  -Continue regular follow up with neurology and with physical therapy. I hope that after a few more weeks of consistent physical therapy, you will begin to notice a difference in your headache frequency.  -I will get some autoimmune labs from you next week for the workup of rheumatoid arthritis.  -Keep an eye on blood pressure at home and please notify me if blood pressure is consistently running >140/90 at home.  -If you would like, reach out to your insurance company and ask if they will cover Zavzpret, which is a nasal spray used to treat headaches, similar to Sumatriptan .   I will see you back in 4-5 months for your physical! It was nice to meet you!   If you have any problems before your next visit feel free to message me via MyChart (minor issues or questions) or call the office, otherwise you may reach out to schedule an office visit.  Thank you! Saddie Sacks, PA-C

## 2024-04-13 NOTE — Assessment & Plan Note (Signed)
 Given family history of RA in mother and maternal grandmother + description of symptoms, will check autoimmune labs to evaluate for RA. ANA, Anti-CCP, RF, ESR, CRP ordered today. Will follow up with results and clinically coordinate.

## 2024-04-13 NOTE — Assessment & Plan Note (Signed)
 BP goal <140/90. BP initially above goal in office today, at goal on recheck. Advised patient to check BP at home more often so that we can get a better idea of what her BP is running at home on average. For now, continue lisinopril  2.5 mg daily. If BP still elevated at next visit, consider increasing to 5 mg.

## 2024-04-13 NOTE — Assessment & Plan Note (Signed)
 MRI brain unremarkable. MRI C-spine showed multilevel degenerative changes with severe foraminal stenosis. Last visit with neurology on 04/01/24. Continue with nortripyline 30 mg at bedtime, PT for neck pain twice weekly, Imitrex  50 mg as needed for acute headache. Next follow up with neurology in 6 months. Discussed adding Zavzpret as additional rescue therapy for acute headache if neurology agrees and her insurance will cover it. Will cont to monitor.

## 2024-04-13 NOTE — Progress Notes (Signed)
 Established Patient Office Visit  Subjective   Patient ID: Pamela Francis, female    DOB: Feb 28, 1961  Age: 63 y.o. MRN: [id number]  Chief Complaint  Patient presents with   Medical Management of Chronic Issues    HPI  Pamela Francis is a 63 y.o. y/o female who presents to the clinic today for follow up on HTN, HA.   HTN: Patient currently taking lisinopril  2.5 mg  for control of their blood pressure. Reports excellence compliance with this medication. Denies side effects or episodes of hypotension. Checks BP at home periodically but not regularly enough to report averages. Denies CP, SOB, Palpitations, vision changes, HA, or edema.  HA: Patient following with neurology. Most recent appointment was 04/01/24. Per last A&P from neurology:   Increase nortriptyline  30 mg at bedtime - Referral to physical therapy, neuromuscular therapy for neck pain - MRI of the brain was unremarkable - MRI cervical spine showed multilevel degenerative changes with moderate to severe foraminal stenosis C3-4 down to C7-T1.  No spinal stenosis or cord signal abnormality. - Continue Imitrex  50 mg as needed for acute headache - Next steps: Gabapentin - Follow-up in 6 months Today, patient reports that she has been to one PT session so far. She goes back this Friday with plans to go twice per week for the next few months and will monitor for improvement. Using current medication regimen listed above, but notes that there has been no improvement, but also no worsening, in her headache severity and frequency.   Patient also reports that she has been experiencing wide spread joint pain in her bilateral finger joints, elbows, and ankles. Denies swelling or redness to the joints. Denies fevers. Denies known injury. Reports that the pain is similar to stiffness and is worse in the morning but does improve after she has been up moving for several hours. Her mom and maternal grandmother both had rheumatoid arthritis and  she would like to be worked up for this.   ROS Per HPI.    Objective:     BP 138/85 (BP Location: Right Arm, Patient Position: Sitting, Cuff Size: Normal)   Pulse 80   Temp 97.7 F (36.5 C) (Oral)   Ht 5' 3 (1.6 m)   Wt 165 lb 1.9 oz (74.9 kg)   LMP  (LMP Unknown)   SpO2 98%   BMI 29.25 kg/m    Physical Exam Constitutional:      General: She is not in acute distress.    Appearance: Normal appearance.  Cardiovascular:     Rate and Rhythm: Normal rate and regular rhythm.     Heart sounds: Normal heart sounds. No murmur heard.    No friction rub. No gallop.  Pulmonary:     Effort: Pulmonary effort is normal. No respiratory distress.     Breath sounds: Normal breath sounds.  Musculoskeletal:        General: No swelling.  Skin:    General: Skin is warm and dry.  Neurological:     General: No focal deficit present.     Mental Status: She is alert.  Psychiatric:        Mood and Affect: Mood normal.        Behavior: Behavior normal.        Thought Content: Thought content normal.    No results found for any visits on 04/13/24.    The 10-year ASCVD risk score (Arnett DK, et al., 2019) is: 5%    Assessment & Plan:  Other fatigue -     Rheumatoid factor; Future -     ANA, IFA (with reflex); Future -     Sedimentation rate; Future -     C-reactive protein; Future -     CYCLIC CITRUL PEPTIDE ANTIBODY, IGG/IGA; Future  Family history of rheumatoid arthritis -     Rheumatoid factor; Future -     ANA, IFA (with reflex); Future -     Sedimentation rate; Future -     C-reactive protein; Future -     CYCLIC CITRUL PEPTIDE ANTIBODY, IGG/IGA; Future  Arthralgia, unspecified joint Assessment & Plan: Given family history of RA in mother and maternal grandmother + description of symptoms, will check autoimmune labs to evaluate for RA. ANA, Anti-CCP, RF, ESR, CRP ordered today. Will follow up with results and clinically coordinate.   Orders: -     Rheumatoid factor;  Future -     ANA, IFA (with reflex); Future -     Sedimentation rate; Future -     C-reactive protein; Future -     CYCLIC CITRUL PEPTIDE ANTIBODY, IGG/IGA; Future  Muscle tension headache Assessment & Plan: MRI brain unremarkable. MRI C-spine showed multilevel degenerative changes with severe foraminal stenosis. Last visit with neurology on 04/01/24. Continue with nortripyline 30 mg at bedtime, PT for neck pain twice weekly, Imitrex  50 mg as needed for acute headache. Next follow up with neurology in 6 months. Discussed adding Zavzpret as additional rescue therapy for acute headache if neurology agrees and her insurance will cover it. Will cont to monitor.   Essential hypertension Assessment & Plan: BP goal <140/90. BP initially above goal in office today, at goal on recheck. Advised patient to check BP at home more often so that we can get a better idea of what her BP is running at home on average. For now, continue lisinopril  2.5 mg daily. If BP still elevated at next visit, consider increasing to 5 mg.      Return in about 4 months (around 08/14/2024) for Physical.    Saddie JULIANNA Sacks, PA-C

## 2024-04-17 ENCOUNTER — Ambulatory Visit: Attending: Neurology

## 2024-04-17 DIAGNOSIS — G4486 Cervicogenic headache: Secondary | ICD-10-CM | POA: Diagnosis not present

## 2024-04-17 DIAGNOSIS — M542 Cervicalgia: Secondary | ICD-10-CM | POA: Insufficient documentation

## 2024-04-17 NOTE — Therapy (Signed)
 OUTPATIENT PHYSICAL THERAPY CERVICAL TREATMENT   Patient Name: Pamela Francis MRN: [id number] DOB:07/18/1961, 63 y.o., female Today's Date: 04/17/2024  END OF SESSION:  PT End of Session - 04/17/24 0951     Visit Number 2    Number of Visits 17    Date for PT Re-Evaluation 06/05/24    PT Start Time 0948    PT Stop Time 1030    PT Time Calculation (min) 42 min    Activity Tolerance Patient limited by pain;Patient tolerated treatment well    Behavior During Therapy [facility] for tasks assessed/performed          Past Medical History:  Diagnosis Date   Allergy    Anxiety    Arthritis    Blood in stool    Chicken pox    Depression    Frequent headaches    Hyperlipidemia    Hypertension    Urine incontinence    Past Surgical History:  Procedure Laterality Date   BREAST BIOPSY Left 02/2005   neg   COLONOSCOPY     Dr. Tamea Holland, [place] 2013   LEEP     TONSILLECTOMY AND ADENOIDECTOMY     WRIST SURGERY Left    Patient Active Problem List   Diagnosis Date Noted   Joint pain 04/13/2024   Neck pain 11/11/2023   Daily headache 11/11/2023   Overactive bladder 09/30/2023   Uterovaginal prolapse, incomplete 09/30/2023   Chronic idiopathic constipation 09/30/2023   Incontinence of feces 09/30/2023   Dysplasia of cervix, low grade (CIN 1) 07/01/2023   Vaginal atrophy 07/01/2023   Mixed incontinence urge and stress 07/01/2023   Osteopenia after menopause 07/01/2023   H/O LEEP 07/01/2023   Impaired fasting glucose 03/04/2023   Vitamin D  deficiency 03/04/2023   Other fatigue 03/04/2023   Genetic testing 11/22/2022   Essential hypertension 08/19/2022   Tinnitus of both ears 08/19/2022   High grade squamous intraepithelial lesion (HGSIL), grade 3 CIN, on biopsy of cervix 07/11/2022   History of loop electrical excision procedure (LEEP) 07/11/2022   Chronic bilateral low back pain with bilateral sciatica 12/31/2021   SUI (stress urinary incontinence, female) 12/31/2021    Body mass index 28.0-28.9, adult 12/31/2021   Osteoarthritis 11/06/2018   Mixed hyperlipidemia 12/20/2014   Anxiety and depression 12/20/2014   Muscle tension headache 12/20/2014    PCP: Gayle Saddie [name]   REFERRING PROVIDER: Gayland Lauraine PARAS, NP  REFERRING DIAG: M54.2 (ICD-10-CM) - Neck pain R51.9 (ICD-10-CM) - Daily headache M54.2 (ICD-10-CM) - Cervicalgia  THERAPY DIAG:  Cervicalgia  Cervicogenic headache  Rationale for Evaluation and Treatment: Rehabilitation  ONSET DATE: 04/01/2024 (Date PT referral signed. Chronic condition)  SUBJECTIVE:  SUBJECTIVE STATEMENT: Pt reports rams head distribution headache. 5-6/10 NPS.    Hand dominance: Left  PERTINENT HISTORY:  Cervicalgia, headaches. Gradual onset, for a while. [name] also states feeling B UE paresthesias to her 5th digits (along the ulnar nerve/C8/T1 dermatome). Headaches located suboccipital area and posterior neck as well as the sinus areas.    No latex allergies.       PAIN:  Are you having pain? Yes: NPRS scale: 5/10 currently neck pain and headache.  Pain location: posterior neck, suboccipital area and B upper trap area Pain description: tight, sore Aggravating factors: cervical rotation R and L, cervical flexion and extension.  Relieving factors: heat  PRECAUTIONS: OSTEOPENIA  RED FLAGS: Bowel or bladder incontinence: Yes: bladder (doctor aware) and Cauda equina syndrome: No     WEIGHT BEARING RESTRICTIONS: No  FALLS:  Has patient fallen in last 6 months? No  LIVING ENVIRONMENT: Lives with: lives with their spouse Lives in: House/apartment Stairs: Yes: External: 3 steps; on left going up Has following equipment at home: None  OCCUPATION: Hair Dresser at [employer]  PLOF: Independent  PATIENT  GOALS: move her neck without it hurting and get rid of the headaches.   NEXT MD VISIT: January 2026  OBJECTIVE:  Note: Objective measures were completed at Evaluation unless otherwise noted.  DIAGNOSTIC FINDINGS:  MR CERVICAL SPINE WO CONTRAST 11/19/2023  Narrative & Impression  [facility]   NEUROIMAGING REPORT     STUDY DATE: 11/19/23 PATIENT NAME: Pamela Francis DOB: 06/30/1961 MRN: [id number]   ORDERING CLINICIAN: Onita Duos, MD  CLINICAL HISTORY: 63 y.o. year old female with: 1. Neck pain   2. Daily headache   3. Cervicalgia       EXAM: MR CERVICAL SPINE WO CONTRAST  TECHNIQUE: MRI of the cervical spine was obtained utilizing multiplanar, multiecho pulse sequences. CONTRAST:  Diagnostic Product Medications (last 72 hours)       None         COMPARISON: none   IMAGING SITE: [facility] [address]     [address] [place] [address] [phone]       FINDINGS:    On sagittal views the vertebral bodies have normal height and alignment.  The spinal cord is normal in size and appearance. The posterior fossa, pituitary gland and paraspinal soft tissues are unremarkable.     On axial views: C2-3 disc bulging with no spinal stenosis or foraminal narrowing C3-4 disc bulging facet hypertrophy with severe bilateral foraminal stenosis C4-5 this bulging and facet artery with moderate right and mild left foraminal stenosis C5-6 disc bulging and facet hypertrophy with severe right and moderate left foraminal stenosis C6-7 disc bulging and facet hypertrophy with severe left foraminal stenosis C7-T1 disc bulging and facet hypertrophy with moderate bilateral foraminal stenosis   Limited views of the soft tissues of the head and neck are unremarkable.     IMPRESSION:    MRI cervical spine without contrast demonstrating: -Multilevel disc bulging and facet hypertrophy with moderate  to severe foraminal stenosis from C3-4 down to C7-T1 as above. -No spinal stenosis or cord signal abnormality.       INTERPRETING PHYSICIAN:  EDUARD FABIENE HANLON, MD Certified in Neurology, Neurophysiology and Neuroimaging   [facility] [address] [place], [place] [address] [phone]      MR BRAIN WO CONTRAST 11/19/2023  Narrative & Impression  [facility]   NEUROIMAGING REPORT  STUDY DATE: 11/19/23 PATIENT NAME: Kesha Hurrell DOB: Mar 01, 1961 MRN: [id number]   ORDERING CLINICIAN: Onita Duos, MD  CLINICAL HISTORY: 63 y.o. year old female with: 1. Neck pain   2. Daily headache   3. Cervicalgia       EXAM: MR BRAIN WO CONTRAST  TECHNIQUE: MRI of the brain without contrast was obtained utilizing multiplanar, multiecho pulse sequences. CONTRAST:  Diagnostic Product Medications (last 72 hours)       None         COMPARISON: none   IMAGING SITE: [facility] [address]     [address] [place] [address] [phone]       FINDINGS:    No abnormal lesions are seen on diffusion-weighted views to suggest acute ischemia. The cortical sulci, fissures and cisterns are normal in size and appearance. Lateral, third and fourth ventricle are normal in size and appearance. No extra-axial fluid collections are seen. No evidence of mass effect or midline shift.  Minimal punctate periventricular subcortical foci of nonspecific T2 hyperintensities.       On sagittal views the posterior fossa, pituitary gland and corpus callosum are unremarkable. No evidence of intracranial hemorrhage on gradient-echo views. The orbits and their contents, paranasal sinuses and calvarium are unremarkable.  Intracranial flow voids are present.     IMPRESSION:    Unremarkable MRI brain (without). No acute findings.          INTERPRETING PHYSICIAN:  EDUARD FABIENE HANLON,  MD Certified in Neurology, Neurophysiology and Neuroimaging   [facility] [address], [address] [place], [place] [address] [phone]        PATIENT SURVEYS:  NDI:  NECK DISABILITY INDEX  Date: 04/07/2024 Score  Pain intensity 3 = The pain is fairly severe at the moment  2. Personal care (washing, dressing, etc.) 0 = I can look after myself normally without causing extra pain  3. Lifting 1 =  I can lift heavy weights but it gives extra pain  4. Reading 2 =  I can read as much as I want with moderate pain in my neck  5. Headaches 4 = I have severe headaches, which come frequently   6. Concentration 1 =  I can concentrate fully when I want to with slight difficulty   7. Work 2 = I can do most of my usual work, but no more  8. Driving 1 =  I can drive my car as long as I want with slight pain in my neck  9. Sleeping 2 = My sleep is mildly disturbed (1-2 hrs sleepless)  10. Recreation 2 = I am able to engage in most, but not all of my usual recreation activities because of   pain in my neck  Total 18/50 (36%)   Minimum Detectable Change (90% confidence): 5 points or 10% points  COGNITION: Overall cognitive status: Within functional limits for tasks assessed  SENSATION:   POSTURE: forward neck, B protracted shoulders, slight L trunk lateral shift, slight upper thoracic kyphosis, R shoulder lower, movement crease around C5/6 and C6/7 areas  PALPATION: TTP suboccipital area and upper cervical spine. Muscle tension suboccipital area and B cervical paraspinal, and B upper trap area.    CERVICAL ROM:   Active ROM A/PROM (deg) eval  Flexion WFL with posterior cervical pulling  Extension Limited with B scalene and sternocleidomastoid muscle area pain.   Right lateral flexion WFL with R lateral neck pain  Left lateral  flexion WFL with L lateral neck pain  Right rotation 55 with L posterior lateral neck pain to shoulder (50 in supine)  Left rotation 30 with L  posterior lateral neck pain > R posterior lateral neck pain. (53 in supine with L cervical side bend)   (Blank rows = not tested)  UPPER EXTREMITY ROM:  Active ROM Right eval Left eval  Shoulder flexion    Shoulder extension    Shoulder abduction    Shoulder adduction    Shoulder extension    Shoulder internal rotation    Shoulder external rotation    Elbow flexion    Elbow extension    Wrist flexion    Wrist extension    Wrist ulnar deviation    Wrist radial deviation    Wrist pronation    Wrist supination     (Blank rows = not tested)  UPPER EXTREMITY MMT:  MMT Right eval Left eval  Shoulder flexion 4 4-  Shoulder extension 4 4  Shoulder abduction    Shoulder adduction    Shoulder extension    Shoulder internal rotation    Shoulder external rotation    Middle trapezius    Lower trapezius    Elbow flexion 4- 4-  Elbow extension 4 4-  Wrist flexion    Wrist extension 4 4-  Wrist ulnar deviation    Wrist radial deviation    Wrist pronation    Wrist supination    Grip strength     (Blank rows = not tested)  CERVICAL SPECIAL TESTS:    FUNCTIONAL TESTS:    TREATMENT DATE: 04/17/2024                                                                                                                               There.Ex:  Cervical retractions in supine: 2x8  Seated upper trap stretch: 2x30 sec/side with gentle hand OP   Scap retractions with GTB: 3x12  Reviewed HEP hand out reviewing reps/sets/frequency. Expected response and when to continue or discontinue exercises.      Manual therapy: 23 min supine Supine STM suboccipital area: 8 minutes for pain modulation, tissue extensibility for rams head distribution head ache.   Cervical STM 8 minutes R/L paraspinals for pain modulation and tissue extensibility.   R/L Grades 2-3 lateral cervical glides C2-C7, 2x5 sec bouts/segment for pain modulation, improved cervical rotation AROM.   Gentle, manual cervical  traction 2x20 sec for pain modulation and improved joint spacing for B radicular symptoms at ulnar distribution.  PATIENT EDUCATION:  Education details: POC Person educated: Patient Education method: Explanation Education comprehension: verbalized understanding  HOME EXERCISE PROGRAM: Access Code: [email or web address] URL: [email or web address] Date: 04/17/2024 Prepared by: Dorina Kingfisher  Exercises - Supine Chin Tuck  - 1 x daily - 4-5 x weekly - 2 sets - 8 reps - Seated Upper Trapezius Stretch  - 1 x daily - 7 x weekly - 1 sets - 3 reps -  30 hold - Scapular Retraction with Resistance  - 1 x daily - 7 x weekly - 2 sets - 12 reps  ASSESSMENT:  CLINICAL IMPRESSION: Pt arriving for first treatment session. Focus of session on manual interventions to improve pain and cervical AROM. Pt does have concordant symptoms/pain primarily with lateral glides but reports as tolerable. Provided initial HEP for postural strengthening and cervical ROM. Good demonstration from pt on form/technique with exercises provided for home use. Education provided on reps/sets/frequency and expected response/purpose of exercises. Pt will benefit from skilled physical therapy services to address the aforementioned deficits.    OBJECTIVE IMPAIRMENTS: decreased ROM, decreased strength, improper body mechanics, postural dysfunction, and pain.   ACTIVITY LIMITATIONS: looking around  PARTICIPATION LIMITATIONS:   PERSONAL FACTORS: Fitness, Past/current experiences, Profession, Time since onset of injury/illness/exacerbation, and 3+ comorbidities: anxiety, depression, HTN are also affecting patient's functional outcome.   REHAB POTENTIAL: Fair    CLINICAL DECISION MAKING: Stable/uncomplicated  EVALUATION COMPLEXITY: Low   GOALS: Goals reviewed with patient? Yes  SHORT TERM GOALS: Target date: 04/17/2024  Pt will be independent with her initial HEP to decrease pain, improve cervical posture, UE strength and  function.  Baseline: Pt has not yet started her initial HEP (04/07/2024) Goal status: INITIAL  LONG TERM GOALS: Target date: 06/05/2024  Pt will have a decrease in neck pain at worst to 3/10 or less to promote ability to look around, perform her job more comfortably.  Baseline: At least 7/10 at worst for the past 3 months (04/07/2024) Goal status: INITIAL  2.  Pt will improve her Neck Disability Index Score by at least 10% as a demonstration of improved function.  Baseline: Neck Disability Index Score 18/50 (36%) (04/07/2024) Goal status: INITIAL  3. Pt will improve R and L cervical rotation AROM in the upright position by at least 10 degrees to promote ability to look around with less difficulty.  Baseline:  Active ROM A/PROM (deg) eval  Right rotation 55 with L posterior lateral neck pain to shoulder (50 in supine)  Left rotation 30 with L posterior lateral neck pain > R posterior lateral neck pain. (53 in supine with L cervical side bend)   Goal status: INITIAL    PLAN:  PT FREQUENCY: 1-2x/week  PT DURATION: 8 weeks  PLANNED INTERVENTIONS: 97110-Therapeutic exercises, 97530- Therapeutic activity, [id number]- Neuromuscular re-education, 97535- Self Care, 02859- Manual therapy, [id number]- Electrical stimulation (unattended), 02987- Traction (mechanical), [id number]- Ionotophoresis 4mg /ml Dexamethasone, Patient/Family education, Joint mobilization, and Spinal mobilization  PLAN FOR NEXT SESSION: F/u on response to HEP. Posture, scapular and anterior cervical strengthening, cervical and scapular mechanics, manual technique, modalities PRN   Pamela Francis M. Fairly IV, PT, DPT Physical Therapist- [place]  [facility] 04/17/2024, 11:34 AM

## 2024-04-20 ENCOUNTER — Other Ambulatory Visit: Payer: Self-pay

## 2024-04-20 ENCOUNTER — Ambulatory Visit

## 2024-04-20 ENCOUNTER — Other Ambulatory Visit

## 2024-04-20 DIAGNOSIS — Z8261 Family history of arthritis: Secondary | ICD-10-CM

## 2024-04-20 DIAGNOSIS — Z130 Encounter for screening for diseases of the blood and blood-forming organs and certain disorders involving the immune mechanism: Secondary | ICD-10-CM

## 2024-04-20 DIAGNOSIS — R5383 Other fatigue: Secondary | ICD-10-CM

## 2024-04-20 DIAGNOSIS — M2550 Pain in unspecified joint: Secondary | ICD-10-CM | POA: Diagnosis not present

## 2024-04-20 DIAGNOSIS — Z6829 Body mass index (BMI) 29.0-29.9, adult: Secondary | ICD-10-CM

## 2024-04-22 ENCOUNTER — Ambulatory Visit

## 2024-04-22 ENCOUNTER — Ambulatory Visit: Payer: Self-pay

## 2024-04-22 DIAGNOSIS — M542 Cervicalgia: Secondary | ICD-10-CM | POA: Diagnosis not present

## 2024-04-22 DIAGNOSIS — G4486 Cervicogenic headache: Secondary | ICD-10-CM | POA: Diagnosis not present

## 2024-04-22 LAB — RHEUMATOID FACTOR: Rheumatoid fact SerPl-aCnc: <10 [IU]/mL (ref <14.0)

## 2024-04-22 LAB — ANTINUCLEAR ANTIBODIES, IFA: ANA Titer 1: NEGATIVE

## 2024-04-22 LAB — C-REACTIVE PROTEIN: CRP: 2 mg/L (ref 0–10)

## 2024-04-22 LAB — SEDIMENTATION RATE: Sed Rate: 5 mm/h (ref 0–40)

## 2024-04-22 LAB — CYCLIC CITRUL PEPTIDE ANTIBODY, IGG/IGA: Cyclic Citrullin Peptide Ab: 7 U (ref 0–19)

## 2024-04-22 NOTE — Therapy (Signed)
 OUTPATIENT PHYSICAL THERAPY CERVICAL TREATMENT   Patient Name: Pamela Francis MRN: [id number] DOB:23-Aug-1961, 63 y.o., female Today's Date: 04/22/2024  END OF SESSION:  PT End of Session - 04/22/24 0735     Visit Number 3    Number of Visits 17    Date for PT Re-Evaluation 06/05/24    PT Start Time 0735    PT Stop Time 0813    PT Time Calculation (min) 38 min    Activity Tolerance Patient limited by pain;Patient tolerated treatment well    Behavior During Therapy [facility] for tasks assessed/performed           Past Medical History:  Diagnosis Date   Allergy    Anxiety    Arthritis    Blood in stool    Chicken pox    Depression    Frequent headaches    Hyperlipidemia    Hypertension    Urine incontinence    Past Surgical History:  Procedure Laterality Date   BREAST BIOPSY Left 02/2005   neg   COLONOSCOPY     Dr. Tamea [place], [place] 2013   LEEP     TONSILLECTOMY AND ADENOIDECTOMY     WRIST SURGERY Left    Patient Active Problem List   Diagnosis Date Noted   Joint pain 04/13/2024   Neck pain 11/11/2023   Daily headache 11/11/2023   Overactive bladder 09/30/2023   Uterovaginal prolapse, incomplete 09/30/2023   Chronic idiopathic constipation 09/30/2023   Incontinence of feces 09/30/2023   Dysplasia of cervix, low grade (CIN 1) 07/01/2023   Vaginal atrophy 07/01/2023   Mixed incontinence urge and stress 07/01/2023   Osteopenia after menopause 07/01/2023   H/O LEEP 07/01/2023   Impaired fasting glucose 03/04/2023   Vitamin D  deficiency 03/04/2023   Other fatigue 03/04/2023   Genetic testing 11/22/2022   Essential hypertension 08/19/2022   Tinnitus of both ears 08/19/2022   High grade squamous intraepithelial lesion (HGSIL), grade 3 CIN, on biopsy of cervix 07/11/2022   History of loop electrical excision procedure (LEEP) 07/11/2022   Chronic bilateral low back pain with bilateral sciatica 12/31/2021   SUI (stress urinary incontinence, female) 12/31/2021    Body mass index 28.0-28.9, adult 12/31/2021   Osteoarthritis 11/06/2018   Mixed hyperlipidemia 12/20/2014   Anxiety and depression 12/20/2014   Muscle tension headache 12/20/2014    PCP: Gayle Saddie [name]   REFERRING PROVIDER: Gayland Lauraine PARAS, NP  REFERRING DIAG: M54.2 (ICD-10-CM) - Neck pain R51.9 (ICD-10-CM) - Daily headache M54.2 (ICD-10-CM) - Cervicalgia  THERAPY DIAG:  Cervicalgia  Cervicogenic headache  Rationale for Evaluation and Treatment: Rehabilitation  ONSET DATE: 04/01/2024 (Date PT referral signed. Chronic condition)  SUBJECTIVE:  SUBJECTIVE STATEMENT: Neck is a little sore. The headache today is not as bad as yesterday. Had a bad headache all day yesterday. 6-7/10 neck pain currently, 5/10 headache currently.       Hand dominance: Left  PERTINENT HISTORY:  Cervicalgia, headaches. Gradual onset, for a while. [name] also states feeling B UE paresthesias to her 5th digits (along the ulnar nerve/C8/T1 dermatome). Headaches located suboccipital area and posterior neck as well as the sinus areas.    No latex allergies.       PAIN:  Are you having pain? Yes: NPRS scale: /10 currently neck pain and headache.  Pain location: posterior neck, suboccipital area and B upper trap area Pain description: tight, sore Aggravating factors: cervical rotation R and L, cervical flexion and extension.  Relieving factors: heat  PRECAUTIONS: OSTEOPENIA  RED FLAGS: Bowel or bladder incontinence: Yes: bladder (doctor aware) and Cauda equina syndrome: No     WEIGHT BEARING RESTRICTIONS: No  FALLS:  Has patient fallen in last 6 months? No  LIVING ENVIRONMENT: Lives with: lives with their spouse Lives in: House/apartment Stairs: Yes: External: 3 steps; on left going  up Has following equipment at home: None  OCCUPATION: Hair Dresser at [employer]  PLOF: Independent  PATIENT GOALS: move her neck without it hurting and get rid of the headaches.   NEXT MD VISIT: January 2026  OBJECTIVE:  Note: Objective measures were completed at Evaluation unless otherwise noted.  DIAGNOSTIC FINDINGS:  MR CERVICAL SPINE WO CONTRAST 11/19/2023  Narrative & Impression  [facility]   NEUROIMAGING REPORT     STUDY DATE: 11/19/23 PATIENT NAME: Gayatri Teasdale DOB: 04-06-1961 MRN: [id number]   ORDERING CLINICIAN: Onita Duos, MD  CLINICAL HISTORY: 63 y.o. year old female with: 1. Neck pain   2. Daily headache   3. Cervicalgia       EXAM: MR CERVICAL SPINE WO CONTRAST  TECHNIQUE: MRI of the cervical spine was obtained utilizing multiplanar, multiecho pulse sequences. CONTRAST:  Diagnostic Product Medications (last 72 hours)       None         COMPARISON: none   IMAGING SITE: [facility] [address]     [address] [place] [address] [phone]       FINDINGS:    On sagittal views the vertebral bodies have normal height and alignment.  The spinal cord is normal in size and appearance. The posterior fossa, pituitary gland and paraspinal soft tissues are unremarkable.     On axial views: C2-3 disc bulging with no spinal stenosis or foraminal narrowing C3-4 disc bulging facet hypertrophy with severe bilateral foraminal stenosis C4-5 this bulging and facet artery with moderate right and mild left foraminal stenosis C5-6 disc bulging and facet hypertrophy with severe right and moderate left foraminal stenosis C6-7 disc bulging and facet hypertrophy with severe left foraminal stenosis C7-T1 disc bulging and facet hypertrophy with moderate bilateral foraminal stenosis   Limited views of the soft tissues of the head and neck are unremarkable.     IMPRESSION:     MRI cervical spine without contrast demonstrating: -Multilevel disc bulging and facet hypertrophy with moderate to severe foraminal stenosis from C3-4 down to C7-T1 as above. -No spinal stenosis or cord signal abnormality.       INTERPRETING PHYSICIAN:  EDUARD FABIENE HANLON, MD Certified in Neurology, Neurophysiology and Neuroimaging   [facility] [address] [place], [place] [address] [phone]  MR BRAIN WO CONTRAST 11/19/2023  Narrative & Impression  [facility]   NEUROIMAGING REPORT     STUDY DATE: 11/19/23 PATIENT NAME: Cesar Alf DOB: 03-Mar-1961 MRN: [id number]   ORDERING CLINICIAN: Onita Duos, MD  CLINICAL HISTORY: 63 y.o. year old female with: 1. Neck pain   2. Daily headache   3. Cervicalgia       EXAM: MR BRAIN WO CONTRAST  TECHNIQUE: MRI of the brain without contrast was obtained utilizing multiplanar, multiecho pulse sequences. CONTRAST:  Diagnostic Product Medications (last 72 hours)       None         COMPARISON: none   IMAGING SITE: [facility] [address]     [address] [place] [address] [phone]       FINDINGS:    No abnormal lesions are seen on diffusion-weighted views to suggest acute ischemia. The cortical sulci, fissures and cisterns are normal in size and appearance. Lateral, third and fourth ventricle are normal in size and appearance. No extra-axial fluid collections are seen. No evidence of mass effect or midline shift.  Minimal punctate periventricular subcortical foci of nonspecific T2 hyperintensities.       On sagittal views the posterior fossa, pituitary gland and corpus callosum are unremarkable. No evidence of intracranial hemorrhage on gradient-echo views. The orbits and their contents, paranasal sinuses and calvarium are unremarkable.  Intracranial flow voids are present.     IMPRESSION:     Unremarkable MRI brain (without). No acute findings.          INTERPRETING PHYSICIAN:  EDUARD FABIENE HANLON, MD Certified in Neurology, Neurophysiology and Neuroimaging   [facility] [address], [address] [place], [place] [address] [phone]        PATIENT SURVEYS:  NDI:  NECK DISABILITY INDEX  Date: 04/07/2024 Score  Pain intensity 3 = The pain is fairly severe at the moment  2. Personal care (washing, dressing, etc.) 0 = I can look after myself normally without causing extra pain  3. Lifting 1 =  I can lift heavy weights but it gives extra pain  4. Reading 2 =  I can read as much as I want with moderate pain in my neck  5. Headaches 4 = I have severe headaches, which come frequently   6. Concentration 1 =  I can concentrate fully when I want to with slight difficulty   7. Work 2 = I can do most of my usual work, but no more  8. Driving 1 =  I can drive my car as long as I want with slight pain in my neck  9. Sleeping 2 = My sleep is mildly disturbed (1-2 hrs sleepless)  10. Recreation 2 = I am able to engage in most, but not all of my usual recreation activities because of   pain in my neck  Total 18/50 (36%)   Minimum Detectable Change (90% confidence): 5 points or 10% points  COGNITION: Overall cognitive status: Within functional limits for tasks assessed  SENSATION:   POSTURE: forward neck, B protracted shoulders, slight L trunk lateral shift, slight upper thoracic kyphosis, R shoulder lower, movement crease around C5/6 and C6/7 areas  PALPATION: TTP suboccipital area and upper cervical spine. Muscle tension suboccipital area and B cervical paraspinal, and B upper trap area.    CERVICAL ROM:   Active ROM A/PROM (deg) eval  Flexion WFL with posterior cervical pulling  Extension Limited with  B scalene and sternocleidomastoid muscle area pain.   Right lateral flexion WFL with R lateral neck pain  Left lateral flexion WFL with L lateral  neck pain  Right rotation 55 with L posterior lateral neck pain to shoulder (50 in supine)  Left rotation 30 with L posterior lateral neck pain > R posterior lateral neck pain. (53 in supine with L cervical side bend)   (Blank rows = not tested)  UPPER EXTREMITY ROM:  Active ROM Right eval Left eval  Shoulder flexion    Shoulder extension    Shoulder abduction    Shoulder adduction    Shoulder extension    Shoulder internal rotation    Shoulder external rotation    Elbow flexion    Elbow extension    Wrist flexion    Wrist extension    Wrist ulnar deviation    Wrist radial deviation    Wrist pronation    Wrist supination     (Blank rows = not tested)  UPPER EXTREMITY MMT:  MMT Right eval Left eval  Shoulder flexion 4 4-  Shoulder extension 4 4  Shoulder abduction    Shoulder adduction    Shoulder extension    Shoulder internal rotation    Shoulder external rotation    Middle trapezius    Lower trapezius    Elbow flexion 4- 4-  Elbow extension 4 4-  Wrist flexion    Wrist extension 4 4-  Wrist ulnar deviation    Wrist radial deviation    Wrist pronation    Wrist supination    Grip strength     (Blank rows = not tested)  CERVICAL SPECIAL TESTS:    FUNCTIONAL TESTS:    TREATMENT DATE: 04/22/2024                                                                                                                               Therapeutic exercises.  Hooklying cervical nod with pressing tongue at roof of mouth. 10x5 seconds , then 10x2  B shoulder flexion to promote thoracic extension 10x3 with 5 second holds  B scapular retraction 10x5 seconds, then 10x. Discomfort, slowly eases off with rest.    Bridge 10x to promote upper thoracic extension    Chin tuck 5x   Discomfort, slowly eases off with rest.   Seated B scapular retraction 10x3   Improved exercise technique, movement at target joints, use of target muscles after mod verbal, visual, tactile cues.    Manual therapy Supine STM suboccipital area to decrease tension   No change in neck pain or headache   PATIENT EDUCATION:  Education details: POC Person educated: Patient Education method: Explanation Education comprehension: verbalized understanding  HOME EXERCISE PROGRAM: Access Code: [email or web address] URL: [email or web address] Date: 04/17/2024 Prepared by: Dorina Kingfisher  Exercises - Supine Chin Tuck  - 1 x daily - 4-5 x weekly - 2 sets - 8 reps - Seated Upper Trapezius Stretch  -  1 x daily - 7 x weekly - 1 sets - 3 reps - 30 hold - Scapular Retraction with Resistance  - 1 x daily - 7 x weekly - 2 sets - 12 reps  ASSESSMENT:  CLINICAL IMPRESSION: Worked on thoracic extension, anterior cervical strengthenining and STM to decrease tension to posterior neck. Pt tolerated session without aggravation of symptoms. Pt will benefit from skilled physical therapy services to address the aforementioned deficits.    OBJECTIVE IMPAIRMENTS: decreased ROM, decreased strength, improper body mechanics, postural dysfunction, and pain.   ACTIVITY LIMITATIONS: looking around  PARTICIPATION LIMITATIONS:   PERSONAL FACTORS: Fitness, Past/current experiences, Profession, Time since onset of injury/illness/exacerbation, and 3+ comorbidities: anxiety, depression, HTN are also affecting patient's functional outcome.   REHAB POTENTIAL: Fair    CLINICAL DECISION MAKING: Stable/uncomplicated  EVALUATION COMPLEXITY: Low   GOALS: Goals reviewed with patient? Yes  SHORT TERM GOALS: Target date: 04/17/2024  Pt will be independent with her initial HEP to decrease pain, improve cervical posture, UE strength and function.  Baseline: Pt has not yet started her initial HEP (04/07/2024) Goal status: INITIAL  LONG TERM GOALS: Target date: 06/05/2024  Pt will have a decrease in neck pain at worst to 3/10 or less to promote ability to look around, perform her job more comfortably.  Baseline: At  least 7/10 at worst for the past 3 months (04/07/2024) Goal status: INITIAL  2.  Pt will improve her Neck Disability Index Score by at least 10% as a demonstration of improved function.  Baseline: Neck Disability Index Score 18/50 (36%) (04/07/2024) Goal status: INITIAL  3. Pt will improve R and L cervical rotation AROM in the upright position by at least 10 degrees to promote ability to look around with less difficulty.  Baseline:  Active ROM A/PROM (deg) eval  Right rotation 55 with L posterior lateral neck pain to shoulder (50 in supine)  Left rotation 30 with L posterior lateral neck pain > R posterior lateral neck pain. (53 in supine with L cervical side bend)   Goal status: INITIAL    PLAN:  PT FREQUENCY: 1-2x/week  PT DURATION: 8 weeks  PLANNED INTERVENTIONS: 97110-Therapeutic exercises, 97530- Therapeutic activity, [id number]- Neuromuscular re-education, 97535- Self Care, 02859- Manual therapy, [id number]- Electrical stimulation (unattended), 02987- Traction (mechanical), [id number]- Ionotophoresis 4mg /ml Dexamethasone, Patient/Family education, Joint mobilization, and Spinal mobilization  PLAN FOR NEXT SESSION: F/u on response to HEP. Posture, scapular and anterior cervical strengthening, cervical and scapular mechanics, manual technique, modalities PRN   Jasiah Buntin, PT, DPT Physical Therapist- [facility]  [facility] 04/22/2024, 6:40 PM

## 2024-04-23 ENCOUNTER — Other Ambulatory Visit: Payer: Self-pay | Admitting: Obstetrics and Gynecology

## 2024-04-23 DIAGNOSIS — N3281 Overactive bladder: Secondary | ICD-10-CM

## 2024-04-25 ENCOUNTER — Other Ambulatory Visit: Payer: Self-pay | Admitting: Family Medicine

## 2024-04-25 DIAGNOSIS — E782 Mixed hyperlipidemia: Secondary | ICD-10-CM

## 2024-04-25 DIAGNOSIS — I10 Essential (primary) hypertension: Secondary | ICD-10-CM

## 2024-04-27 ENCOUNTER — Encounter

## 2024-04-28 ENCOUNTER — Ambulatory Visit

## 2024-04-28 DIAGNOSIS — M542 Cervicalgia: Secondary | ICD-10-CM | POA: Diagnosis not present

## 2024-04-28 DIAGNOSIS — G4486 Cervicogenic headache: Secondary | ICD-10-CM | POA: Diagnosis not present

## 2024-04-28 NOTE — Therapy (Signed)
 OUTPATIENT PHYSICAL THERAPY CERVICAL TREATMENT   Patient Name: Pamela Francis MRN: [id number] DOB:April 30, 1961, 63 y.o., female Today's Date: 04/28/2024  END OF SESSION:  PT End of Session - 04/28/24 0744     Visit Number 4    Number of Visits 17    Date for PT Re-Evaluation 06/05/24    PT Start Time 0744   pt arrived late   PT Stop Time 0815    PT Time Calculation (min) 31 min    Activity Tolerance Patient limited by pain;Patient tolerated treatment well    Behavior During Therapy [facility] for tasks assessed/performed            Past Medical History:  Diagnosis Date   Allergy    Anxiety    Arthritis    Blood in stool    Chicken pox    Depression    Frequent headaches    Hyperlipidemia    Hypertension    Urine incontinence    Past Surgical History:  Procedure Laterality Date   BREAST BIOPSY Left 02/2005   neg   COLONOSCOPY     Dr. Tamea [place], [place] 2013   LEEP     TONSILLECTOMY AND ADENOIDECTOMY     WRIST SURGERY Left    Patient Active Problem List   Diagnosis Date Noted   Joint pain 04/13/2024   Neck pain 11/11/2023   Daily headache 11/11/2023   Overactive bladder 09/30/2023   Uterovaginal prolapse, incomplete 09/30/2023   Chronic idiopathic constipation 09/30/2023   Incontinence of feces 09/30/2023   Dysplasia of cervix, low grade (CIN 1) 07/01/2023   Vaginal atrophy 07/01/2023   Mixed incontinence urge and stress 07/01/2023   Osteopenia after menopause 07/01/2023   H/O LEEP 07/01/2023   Impaired fasting glucose 03/04/2023   Vitamin D  deficiency 03/04/2023   Other fatigue 03/04/2023   Genetic testing 11/22/2022   Essential hypertension 08/19/2022   Tinnitus of both ears 08/19/2022   High grade squamous intraepithelial lesion (HGSIL), grade 3 CIN, on biopsy of cervix 07/11/2022   History of loop electrical excision procedure (LEEP) 07/11/2022   Chronic bilateral low back pain with bilateral sciatica 12/31/2021   SUI (stress urinary incontinence,  female) 12/31/2021   Body mass index 28.0-28.9, adult 12/31/2021   Osteoarthritis 11/06/2018   Mixed hyperlipidemia 12/20/2014   Anxiety and depression 12/20/2014   Muscle tension headache 12/20/2014    PCP: Pamela Francis   REFERRING PROVIDER: Gayland Lauraine PARAS, NP  REFERRING DIAG: M54.2 (ICD-10-CM) - Neck pain R51.9 (ICD-10-CM) - Daily headache M54.2 (ICD-10-CM) - Cervicalgia  THERAPY DIAG:  Cervicalgia  Cervicogenic headache  Rationale for Evaluation and Treatment: Rehabilitation  ONSET DATE: 04/01/2024 (Date PT referral signed. Chronic condition)  SUBJECTIVE:  SUBJECTIVE STATEMENT: Has a headache (posterior) currently, 5-6/10. Neck is stiff, 5/10 neck pain currently. Woke up on her L side this morning. Sometimes sleeps on her stomach. Usually sleeps on her side.     Hand dominance: Left  PERTINENT HISTORY:  Cervicalgia, headaches. Gradual onset, for a while. Pamela Francis also states feeling B UE paresthesias to her 5th digits (along the ulnar nerve/C8/T1 dermatome). Headaches located suboccipital area and posterior neck as well as the sinus areas.    No latex allergies.        PAIN:  Are you having pain? Yes: NPRS scale: /10 currently neck pain and headache.  Pain location: posterior neck, suboccipital area and B upper trap area Pain description: tight, sore Aggravating factors: cervical rotation R and L, cervical flexion and extension.  Relieving factors: heat  PRECAUTIONS: OSTEOPENIA  RED FLAGS: Bowel or bladder incontinence: Yes: bladder (doctor aware) and Cauda equina syndrome: No     WEIGHT BEARING RESTRICTIONS: No  FALLS:  Has patient fallen in last 6 months? No  LIVING ENVIRONMENT: Lives with: lives with their spouse Lives in: House/apartment Stairs:  Yes: External: 3 steps; on left going up Has following equipment at home: None  OCCUPATION: Hair Dresser at [employer]  PLOF: Independent  PATIENT GOALS: move her neck without it hurting and get rid of the headaches.   NEXT MD VISIT: January 2026  OBJECTIVE:  Note: Objective measures were completed at Evaluation unless otherwise noted.  DIAGNOSTIC FINDINGS:  MR CERVICAL SPINE WO CONTRAST 11/19/2023  Narrative & Impression  [facility]   NEUROIMAGING REPORT     STUDY DATE: 11/19/23 PATIENT NAME: Pamela Francis DOB: 09-Mar-1961 MRN: [id number]   ORDERING CLINICIAN: Onita Duos, MD  CLINICAL HISTORY: 63 y.o. year old female with: 1. Neck pain   2. Daily headache   3. Cervicalgia       EXAM: MR CERVICAL SPINE WO CONTRAST  TECHNIQUE: MRI of the cervical spine was obtained utilizing multiplanar, multiecho pulse sequences. CONTRAST:  Diagnostic Product Medications (last 72 hours)       None         COMPARISON: none   IMAGING SITE: [facility] [address]     [address] [place] [address] [phone]       FINDINGS:    On sagittal views the vertebral bodies have normal height and alignment.  The spinal cord is normal in size and appearance. The posterior fossa, pituitary gland and paraspinal soft tissues are unremarkable.     On axial views: C2-3 disc bulging with no spinal stenosis or foraminal narrowing C3-4 disc bulging facet hypertrophy with severe bilateral foraminal stenosis C4-5 this bulging and facet artery with moderate right and mild left foraminal stenosis C5-6 disc bulging and facet hypertrophy with severe right and moderate left foraminal stenosis C6-7 disc bulging and facet hypertrophy with severe left foraminal stenosis C7-T1 disc bulging and facet hypertrophy with moderate bilateral foraminal stenosis   Limited views of the soft tissues of the head and neck  are unremarkable.     IMPRESSION:    MRI cervical spine without contrast demonstrating: -Multilevel disc bulging and facet hypertrophy with moderate to severe foraminal stenosis from C3-4 down to C7-T1 as above. -No spinal stenosis or cord signal abnormality.       INTERPRETING PHYSICIAN:  EDUARD FABIENE HANLON, MD Certified in Neurology, Neurophysiology and Neuroimaging   [facility] [address], [address] [place], [place] [address] [phone]  MR BRAIN WO CONTRAST 11/19/2023  Narrative & Impression  [facility]   NEUROIMAGING REPORT     STUDY DATE: 11/19/23 PATIENT NAME: Pamela Francis DOB: 1960-11-29 MRN: [id number]   ORDERING CLINICIAN: Onita Duos, MD  CLINICAL HISTORY: 63 y.o. year old female with: 1. Neck pain   2. Daily headache   3. Cervicalgia       EXAM: MR BRAIN WO CONTRAST  TECHNIQUE: MRI of the brain without contrast was obtained utilizing multiplanar, multiecho pulse sequences. CONTRAST:  Diagnostic Product Medications (last 72 hours)       None         COMPARISON: none   IMAGING SITE: [facility] [address]     [address] [place] [address] [phone]       FINDINGS:    No abnormal lesions are seen on diffusion-weighted views to suggest acute ischemia. The cortical sulci, fissures and cisterns are normal in size and appearance. Lateral, third and fourth ventricle are normal in size and appearance. No extra-axial fluid collections are seen. No evidence of mass effect or midline shift.  Minimal punctate periventricular subcortical foci of nonspecific T2 hyperintensities.       On sagittal views the posterior fossa, pituitary gland and corpus callosum are unremarkable. No evidence of intracranial hemorrhage on gradient-echo views. The orbits and their contents, paranasal sinuses and calvarium are unremarkable.  Intracranial flow  voids are present.     IMPRESSION:    Unremarkable MRI brain (without). No acute findings.          INTERPRETING PHYSICIAN:  EDUARD FABIENE HANLON, MD Certified in Neurology, Neurophysiology and Neuroimaging   [facility] [address] [place], [place] [address] [phone]        PATIENT SURVEYS:  NDI:  NECK DISABILITY INDEX  Date: 04/07/2024 Score  Pain intensity 3 = The pain is fairly severe at the moment  2. Personal care (washing, dressing, etc.) 0 = I can look after myself normally without causing extra pain  3. Lifting 1 =  I can lift heavy weights but it gives extra pain  4. Reading 2 =  I can read as much as I want with moderate pain in my neck  5. Headaches 4 = I have severe headaches, which come frequently   6. Concentration 1 =  I can concentrate fully when I want to with slight difficulty   7. Work 2 = I can do most of my usual work, but no more  8. Driving 1 =  I can drive my car as long as I want with slight pain in my neck  9. Sleeping 2 = My sleep is mildly disturbed (1-2 hrs sleepless)  10. Recreation 2 = I am able to engage in most, but not all of my usual recreation activities because of   pain in my neck  Total 18/50 (36%)   Minimum Detectable Change (90% confidence): 5 points or 10% points  COGNITION: Overall cognitive status: Within functional limits for tasks assessed  SENSATION:   POSTURE: forward neck, B protracted shoulders, slight L trunk lateral shift, slight upper thoracic kyphosis, R shoulder lower, movement crease around C5/6 and C6/7 areas  PALPATION: TTP suboccipital area and upper cervical spine. Muscle tension suboccipital area and B cervical paraspinal, and B upper trap area.    CERVICAL ROM:   Active ROM A/PROM (deg) eval  Flexion WFL with posterior cervical pulling  Extension Limited with  B scalene and sternocleidomastoid muscle area pain.   Right lateral flexion WFL with R lateral neck pain   Left lateral flexion WFL with L lateral neck pain  Right rotation 55 with L posterior lateral neck pain to shoulder (50 in supine)  Left rotation 30 with L posterior lateral neck pain > R posterior lateral neck pain. (53 in supine with L cervical side bend)   (Blank rows = not tested)  UPPER EXTREMITY ROM:  Active ROM Right eval Left eval  Shoulder flexion    Shoulder extension    Shoulder abduction    Shoulder adduction    Shoulder extension    Shoulder internal rotation    Shoulder external rotation    Elbow flexion    Elbow extension    Wrist flexion    Wrist extension    Wrist ulnar deviation    Wrist radial deviation    Wrist pronation    Wrist supination     (Blank rows = not tested)  UPPER EXTREMITY MMT:  MMT Right eval Left eval  Shoulder flexion 4 4-  Shoulder extension 4 4  Shoulder abduction    Shoulder adduction    Shoulder extension    Shoulder internal rotation    Shoulder external rotation    Middle trapezius    Lower trapezius    Elbow flexion 4- 4-  Elbow extension 4 4-  Wrist flexion    Wrist extension 4 4-  Wrist ulnar deviation    Wrist radial deviation    Wrist pronation    Wrist supination    Grip strength     (Blank rows = not tested)  CERVICAL SPECIAL TESTS:    FUNCTIONAL TESTS:    TREATMENT DATE: 04/28/2024                                                                                                                               Therapeutic exercises  Hooklying chin tuck 10x5 seconds for 3 sets   Pt was recommended to sleep on her back instead of her stomach or sides to see if it will help her with her posterior headaches in the morning. Pt verbalized understanding.  Hooklying   B shoulder horizontal abduction 10x3 with 5 second holds to promote upper thoracic extension and decrease stress to neck.    Improved exercise technique, movement at target joints, use of target muscles after mod verbal, visual, tactile cues.    Manual therapy Supine STM suboccipital area to decrease tension  Supine sustained P to A pressure to B C4 and 5 TP to promote gentle extension   Slight decrease in symptoms afterwards   PATIENT EDUCATION:  Education details: POC Person educated: Patient Education method: Explanation Education comprehension: verbalized understanding  HOME EXERCISE PROGRAM: Access Code: [email or web address] URL: [email or web address] Date: 04/17/2024 Prepared by: Dorina Kingfisher  Exercises - Supine Chin Tuck  - 1 x daily - 4-5 x weekly - 2 sets - 8  reps - Seated Upper Trapezius Stretch  - 1 x daily - 7 x weekly - 1 sets - 3 reps - 30 hold - Scapular Retraction with Resistance  - 1 x daily - 7 x weekly - 2 sets - 12 reps  Chin tuck throughtout the day.   ASSESSMENT:  CLINICAL IMPRESSION: Pt arrived late so session was adjusted accordingly. Worked on STM to promote gentle extension mid cervical spine to promote better placement of discs as well as STM to suboccipital area to decrease pressure to greater occipital nerves and decrease headache. Slight decreased in symptoms reported afterwards. Continued working on thoracic extension to help decrease stress to her neck.  Pt tolerated session without aggravation of symptoms. Pt will benefit from skilled physical therapy services to address the aforementioned deficits.    OBJECTIVE IMPAIRMENTS: decreased ROM, decreased strength, improper body mechanics, postural dysfunction, and pain.   ACTIVITY LIMITATIONS: looking around  PARTICIPATION LIMITATIONS:   PERSONAL FACTORS: Fitness, Past/current experiences, Profession, Time since onset of injury/illness/exacerbation, and 3+ comorbidities: anxiety, depression, HTN are also affecting patient's functional outcome.   REHAB POTENTIAL: Fair    CLINICAL DECISION MAKING: Stable/uncomplicated  EVALUATION COMPLEXITY: Low   GOALS: Goals reviewed with patient? Yes  SHORT TERM GOALS: Target date:  04/17/2024  Pt will be independent with her initial HEP to decrease pain, improve cervical posture, UE strength and function.  Baseline: Pt has not yet started her initial HEP (04/07/2024) Goal status: INITIAL  LONG TERM GOALS: Target date: 06/05/2024  Pt will have a decrease in neck pain at worst to 3/10 or less to promote ability to look around, perform her job more comfortably.  Baseline: At least 7/10 at worst for the past 3 months (04/07/2024) Goal status: INITIAL  2.  Pt will improve her Neck Disability Index Score by at least 10% as a demonstration of improved function.  Baseline: Neck Disability Index Score 18/50 (36%) (04/07/2024) Goal status: INITIAL  3. Pt will improve R and L cervical rotation AROM in the upright position by at least 10 degrees to promote ability to look around with less difficulty.  Baseline:  Active ROM A/PROM (deg) eval  Right rotation 55 with L posterior lateral neck pain to shoulder (50 in supine)  Left rotation 30 with L posterior lateral neck pain > R posterior lateral neck pain. (53 in supine with L cervical side bend)   Goal status: INITIAL    PLAN:  PT FREQUENCY: 1-2x/week  PT DURATION: 8 weeks  PLANNED INTERVENTIONS: 97110-Therapeutic exercises, 97530- Therapeutic activity, [id number]- Neuromuscular re-education, 97535- Self Care, 02859- Manual therapy, [id number]- Electrical stimulation (unattended), 02987- Traction (mechanical), [id number]- Ionotophoresis 4mg /ml Dexamethasone, Patient/Family education, Joint mobilization, and Spinal mobilization  PLAN FOR NEXT SESSION: F/u on response to HEP. Posture, scapular and anterior cervical strengthening, cervical and scapular mechanics, manual technique, modalities PRN   Azyiah Bo, PT, DPT Physical Therapist- [facility]  [facility] 04/28/2024, 4:45 PM

## 2024-04-29 ENCOUNTER — Encounter

## 2024-04-30 ENCOUNTER — Ambulatory Visit

## 2024-04-30 ENCOUNTER — Telehealth: Payer: Self-pay

## 2024-04-30 NOTE — Telephone Encounter (Signed)
No show. Called patient but unable to leave a message secondary to mailbox being full

## 2024-05-04 ENCOUNTER — Encounter

## 2024-05-05 ENCOUNTER — Ambulatory Visit

## 2024-05-05 DIAGNOSIS — M542 Cervicalgia: Secondary | ICD-10-CM | POA: Diagnosis not present

## 2024-05-05 DIAGNOSIS — G4486 Cervicogenic headache: Secondary | ICD-10-CM

## 2024-05-05 NOTE — Therapy (Signed)
 OUTPATIENT PHYSICAL THERAPY CERVICAL TREATMENT   Patient Name: Pamela Francis MRN: [id number] DOB:[birth year], 63 y.o., female Today's Date: 05/05/2024  END OF SESSION:  PT End of Session - 05/05/24 0735     Visit Number 5    Number of Visits 17    Date for PT Re-Evaluation 06/05/24    PT Start Time 0735    PT Stop Time 0829    PT Time Calculation (min) 54 min    Activity Tolerance Patient limited by pain;Patient tolerated treatment well    Behavior During Therapy [facility] for tasks assessed/performed             Past Medical History:  Diagnosis Date   Allergy    Anxiety    Arthritis    Blood in stool    Chicken pox    Depression    Frequent headaches    Hyperlipidemia    Hypertension    Urine incontinence    Past Surgical History:  Procedure Laterality Date   BREAST BIOPSY Left 02/2005   neg   COLONOSCOPY     Dr. Tamea [place], [place] 2013   LEEP     TONSILLECTOMY AND ADENOIDECTOMY     WRIST SURGERY Left    Patient Active Problem List   Diagnosis Date Noted   Joint pain 04/13/2024   Neck pain 11/11/2023   Daily headache 11/11/2023   Overactive bladder 09/30/2023   Uterovaginal prolapse, incomplete 09/30/2023   Chronic idiopathic constipation 09/30/2023   Incontinence of feces 09/30/2023   Dysplasia of cervix, low grade (CIN 1) 07/01/2023   Vaginal atrophy 07/01/2023   Mixed incontinence urge and stress 07/01/2023   Osteopenia after menopause 07/01/2023   H/O LEEP 07/01/2023   Impaired fasting glucose 03/04/2023   Vitamin D  deficiency 03/04/2023   Other fatigue 03/04/2023   Genetic testing 11/22/2022   Essential hypertension 08/19/2022   Tinnitus of both ears 08/19/2022   High grade squamous intraepithelial lesion (HGSIL), grade 3 CIN, on biopsy of cervix 07/11/2022   History of loop electrical excision procedure (LEEP) 07/11/2022   Chronic bilateral low back pain with bilateral sciatica 12/31/2021   SUI (stress urinary incontinence, female)  12/31/2021   Body mass index 28.0-28.9, adult 12/31/2021   Osteoarthritis 11/06/2018   Mixed hyperlipidemia 12/20/2014   Anxiety and depression 12/20/2014   Muscle tension headache 12/20/2014    PCP: Pamela Francis   REFERRING PROVIDER: Gayland Lauraine PARAS, NP  REFERRING DIAG: M54.2 (ICD-10-CM) - Neck pain R51.9 (ICD-10-CM) - Daily headache M54.2 (ICD-10-CM) - Cervicalgia  THERAPY DIAG:  Cervicalgia  Cervicogenic headache  Rationale for Evaluation and Treatment: Rehabilitation  ONSET DATE: 04/01/2024 (Date PT referral signed. Chronic condition)  SUBJECTIVE:  SUBJECTIVE STATEMENT: Headache is not as bad. Neck hurts, 6/10 currently.      Hand dominance: Left  PERTINENT HISTORY:  Cervicalgia, headaches. Gradual onset, for a while. Pamela Francis also states feeling B UE paresthesias to her 5th digits (along the ulnar nerve/C8/T1 dermatome). Headaches located suboccipital area and posterior neck as well as the sinus areas.    No latex allergies.         PAIN:  Are you having pain? Yes: NPRS scale: /10 currently neck pain and headache.  Pain location: posterior neck, suboccipital area and B upper trap area Pain description: tight, sore Aggravating factors: cervical rotation R and L, cervical flexion and extension.  Relieving factors: heat  PRECAUTIONS: OSTEOPENIA  RED FLAGS: Bowel or bladder incontinence: Yes: bladder (doctor aware) and Cauda equina syndrome: No     WEIGHT BEARING RESTRICTIONS: No  FALLS:  Has patient fallen in last 6 months? No  LIVING ENVIRONMENT: Lives with: lives with their spouse Lives in: House/apartment Stairs: Yes: External: 3 steps; on left going up Has following equipment at home: None  OCCUPATION: Hair Dresser at [employer]  PLOF:  Independent  PATIENT GOALS: move her neck without it hurting and get rid of the headaches.   NEXT MD VISIT: January 2026  OBJECTIVE:  Note: Objective measures were completed at Evaluation unless otherwise noted.  DIAGNOSTIC FINDINGS:  MR CERVICAL SPINE WO CONTRAST 11/19/2023  Narrative & Impression  [facility]   NEUROIMAGING REPORT     STUDY DATE: 11/19/23 PATIENT NAME: Pamela Francis DOB: 1961-01-18 MRN: [id number]   ORDERING CLINICIAN: Onita Duos, MD  CLINICAL HISTORY: 63 y.o. year old female with: 1. Neck pain   2. Daily headache   3. Cervicalgia       EXAM: MR CERVICAL SPINE WO CONTRAST  TECHNIQUE: MRI of the cervical spine was obtained utilizing multiplanar, multiecho pulse sequences. CONTRAST:  Diagnostic Product Medications (last 72 hours)       None         COMPARISON: none   IMAGING SITE: [facility] [address]     [address] [place] [address] [phone]       FINDINGS:    On sagittal views the vertebral bodies have normal height and alignment.  The spinal cord is normal in size and appearance. The posterior fossa, pituitary gland and paraspinal soft tissues are unremarkable.     On axial views: C2-3 disc bulging with no spinal stenosis or foraminal narrowing C3-4 disc bulging facet hypertrophy with severe bilateral foraminal stenosis C4-5 this bulging and facet artery with moderate right and mild left foraminal stenosis C5-6 disc bulging and facet hypertrophy with severe right and moderate left foraminal stenosis C6-7 disc bulging and facet hypertrophy with severe left foraminal stenosis C7-T1 disc bulging and facet hypertrophy with moderate bilateral foraminal stenosis   Limited views of the soft tissues of the head and neck are unremarkable.     IMPRESSION:    MRI cervical spine without contrast demonstrating: -Multilevel disc bulging and facet  hypertrophy with moderate to severe foraminal stenosis from C3-4 down to C7-T1 as above. -No spinal stenosis or cord signal abnormality.       INTERPRETING PHYSICIAN:  Pamela FABIENE HANLON, MD Certified in Neurology, Neurophysiology and Neuroimaging   [facility] [address], [address] [place], [place] [address] [phone]      MR BRAIN WO CONTRAST 11/19/2023  Narrative & Impression  [facility]  NEUROIMAGING REPORT     STUDY DATE: 11/19/23 PATIENT NAME: Keneshia Tena DOB: [birth year] MRN: [id number]   ORDERING CLINICIAN: Onita Duos, MD  CLINICAL HISTORY: 63 y.o. year old female with: 1. Neck pain   2. Daily headache   3. Cervicalgia       EXAM: MR BRAIN WO CONTRAST  TECHNIQUE: MRI of the brain without contrast was obtained utilizing multiplanar, multiecho pulse sequences. CONTRAST:  Diagnostic Product Medications (last 72 hours)       None         COMPARISON: none   IMAGING SITE: [facility] [address]     [address] [place] [address] [phone]       FINDINGS:    No abnormal lesions are seen on diffusion-weighted views to suggest acute ischemia. The cortical sulci, fissures and cisterns are normal in size and appearance. Lateral, third and fourth ventricle are normal in size and appearance. No extra-axial fluid collections are seen. No evidence of mass effect or midline shift.  Minimal punctate periventricular subcortical foci of nonspecific T2 hyperintensities.       On sagittal views the posterior fossa, pituitary gland and corpus callosum are unremarkable. No evidence of intracranial hemorrhage on gradient-echo views. The orbits and their contents, paranasal sinuses and calvarium are unremarkable.  Intracranial flow voids are present.     IMPRESSION:    Unremarkable MRI brain (without). No acute findings.          INTERPRETING  PHYSICIAN:  Pamela FABIENE HANLON, MD Certified in Neurology, Neurophysiology and Neuroimaging   [facility] [address] [place], [place] [address] [phone]        PATIENT SURVEYS:  NDI:  NECK DISABILITY INDEX  Date: 04/07/2024 Score  Pain intensity 3 = The pain is fairly severe at the moment  2. Personal care (washing, dressing, etc.) 0 = I can look after myself normally without causing extra pain  3. Lifting 1 =  I can lift heavy weights but it gives extra pain  4. Reading 2 =  I can read as much as I want with moderate pain in my neck  5. Headaches 4 = I have severe headaches, which come frequently   6. Concentration 1 =  I can concentrate fully when I want to with slight difficulty   7. Work 2 = I can do most of my usual work, but no more  8. Driving 1 =  I can drive my car as long as I want with slight pain in my neck  9. Sleeping 2 = My sleep is mildly disturbed (1-2 hrs sleepless)  10. Recreation 2 = I am able to engage in most, but not all of my usual recreation activities because of   pain in my neck  Total 18/50 (36%)   Minimum Detectable Change (90% confidence): 5 points or 10% points  COGNITION: Overall cognitive status: Within functional limits for tasks assessed  SENSATION:   POSTURE: forward neck, B protracted shoulders, slight L trunk lateral shift, slight upper thoracic kyphosis, R shoulder lower, movement crease around C5/6 and C6/7 areas  PALPATION: TTP suboccipital area and upper cervical spine. Muscle tension suboccipital area and B cervical paraspinal, and B upper trap area.    CERVICAL ROM:   Active ROM A/PROM (deg) eval  Flexion WFL with posterior cervical pulling  Extension Limited with B scalene and sternocleidomastoid muscle area pain.   Right lateral flexion WFL with R  lateral neck pain  Left lateral flexion WFL with L lateral neck pain  Right rotation 55 with L posterior lateral neck pain to shoulder (50 in  supine)  Left rotation 30 with L posterior lateral neck pain > R posterior lateral neck pain. (53 in supine with L cervical side bend)   (Blank rows = not tested)  UPPER EXTREMITY ROM:  Active ROM Right eval Left eval  Shoulder flexion    Shoulder extension    Shoulder abduction    Shoulder adduction    Shoulder extension    Shoulder internal rotation    Shoulder external rotation    Elbow flexion    Elbow extension    Wrist flexion    Wrist extension    Wrist ulnar deviation    Wrist radial deviation    Wrist pronation    Wrist supination     (Blank rows = not tested)  UPPER EXTREMITY MMT:  MMT Right eval Left eval  Shoulder flexion 4 4-  Shoulder extension 4 4  Shoulder abduction    Shoulder adduction    Shoulder extension    Shoulder internal rotation    Shoulder external rotation    Middle trapezius    Lower trapezius    Elbow flexion 4- 4-  Elbow extension 4 4-  Wrist flexion    Wrist extension 4 4-  Wrist ulnar deviation    Wrist radial deviation    Wrist pronation    Wrist supination    Grip strength     (Blank rows = not tested)  CERVICAL SPECIAL TESTS:    FUNCTIONAL TESTS:    TREATMENT DATE: 05/05/2024                                                                                                                               Manual therapy Supine STM suboccipital area R >L  to decrease tension  Supine sustained P to A pressure to B C4 and C5 TP to promote gentle extension   Supine manual cervical traction   Slight decrease in neck pain reported   Therapeutic exercises  Hooklying  chin tuck 10x5 seconds for 3 sets    With towel roll behind thoracic spine    B scapular retraction 10x5 seconds   Seated thoracic extension at chair back 10x5 seconds for 2 sets   Standing B shoulder extension red band with B scapular retraction   10x2 with 5 second holds  To promote thoracic extension and decrease stress to cervical spine.  Seated  manually resisted scapular retraction targeting the lower trap muscle  R 10x5 seconds   L 10x5 seconds    Seated R first rib stretch with strap 30 seconds x 3  Unclear whether or not there is improvement in neck pain  Seated L cervical rotation 10x with sustained gentle P to A pressure to R C2 TP    Improved exercise technique, movement at target joints, use  of target muscles after mod verbal, visual, tactile cues.    PATIENT EDUCATION:  Education details: POC Person educated: Patient Education method: Explanation Education comprehension: verbalized understanding  HOME EXERCISE PROGRAM: Access Code: [email or web address] URL: [email or web address] Date: 04/17/2024 Prepared by: Dorina Kingfisher  Exercises - Supine Chin Tuck  - 1 x daily - 4-5 x weekly - 2 sets - 8 reps - Seated Upper Trapezius Stretch  - 1 x daily - 7 x weekly - 1 sets - 3 reps - 30 hold - Scapular Retraction with Resistance  - 1 x daily - 7 x weekly - 2 sets - 12 reps  Chin tuck throughtout the day.   ASSESSMENT:  CLINICAL IMPRESSION:  Decreased neck symptoms reported after treatment initially especially with manual cervical traction to decrease pressure to cervical joints. Symptoms however seem to have not improved based on pt reports after session.  Pt tolerated session without aggravation of symptoms. Pt will benefit from skilled physical therapy services to address the aforementioned deficits.      OBJECTIVE IMPAIRMENTS: decreased ROM, decreased strength, improper body mechanics, postural dysfunction, and pain.   ACTIVITY LIMITATIONS: looking around  PARTICIPATION LIMITATIONS:   PERSONAL FACTORS: Fitness, Past/current experiences, Profession, Time since onset of injury/illness/exacerbation, and 3+ comorbidities: anxiety, depression, HTN are also affecting patient's functional outcome.   REHAB POTENTIAL: Fair    CLINICAL DECISION MAKING: Stable/uncomplicated  EVALUATION COMPLEXITY:  Low   GOALS: Goals reviewed with patient? Yes  SHORT TERM GOALS: Target date: 04/17/2024  Pt will be independent with her initial HEP to decrease pain, improve cervical posture, UE strength and function.  Baseline: Pt has not yet started her initial HEP (04/07/2024) Goal status: INITIAL  LONG TERM GOALS: Target date: 06/05/2024  Pt will have a decrease in neck pain at worst to 3/10 or less to promote ability to look around, perform her job more comfortably.  Baseline: At least 7/10 at worst for the past 3 months (04/07/2024) Goal status: INITIAL  2.  Pt will improve her Neck Disability Index Score by at least 10% as a demonstration of improved function.  Baseline: Neck Disability Index Score 18/50 (36%) (04/07/2024) Goal status: INITIAL  3. Pt will improve R and L cervical rotation AROM in the upright position by at least 10 degrees to promote ability to look around with less difficulty.  Baseline:  Active ROM A/PROM (deg) eval  Right rotation 55 with L posterior lateral neck pain to shoulder (50 in supine)  Left rotation 30 with L posterior lateral neck pain > R posterior lateral neck pain. (53 in supine with L cervical side bend)   Goal status: INITIAL    PLAN:  PT FREQUENCY: 1-2x/week  PT DURATION: 8 weeks  PLANNED INTERVENTIONS: 97110-Therapeutic exercises, 97530- Therapeutic activity, [id number]- Neuromuscular re-education, 97535- Self Care, 02859- Manual therapy, [id number]- Electrical stimulation (unattended), 02987- Traction (mechanical), [id number]- Ionotophoresis 4mg /ml Dexamethasone, Patient/Family education, Joint mobilization, and Spinal mobilization  PLAN FOR NEXT SESSION: F/u on response to HEP. Posture, scapular and anterior cervical strengthening, cervical and scapular mechanics, manual technique, modalities PRN   Ahmad Vanwey, PT, DPT Physical Therapist- [facility]  [facility] 05/05/2024, 8:41 AM

## 2024-05-08 ENCOUNTER — Ambulatory Visit

## 2024-05-08 DIAGNOSIS — M542 Cervicalgia: Secondary | ICD-10-CM

## 2024-05-08 DIAGNOSIS — G4486 Cervicogenic headache: Secondary | ICD-10-CM | POA: Diagnosis not present

## 2024-05-08 NOTE — Therapy (Signed)
 OUTPATIENT PHYSICAL THERAPY CERVICAL TREATMENT   Patient Name: Pamela Francis MRN: [id number] DOB:Sep 22, 1960, 63 y.o., female Today's Date: 05/08/2024  END OF SESSION:  PT End of Session - 05/08/24 0818     Visit Number 6    Number of Visits 17    Date for PT Re-Evaluation 06/05/24    PT Start Time 0818    PT Stop Time 0857    PT Time Calculation (min) 39 min    Activity Tolerance Patient limited by pain;Patient tolerated treatment well    Behavior During Therapy [facility] for tasks assessed/performed              Past Medical History:  Diagnosis Date   Allergy    Anxiety    Arthritis    Blood in stool    Chicken pox    Depression    Frequent headaches    Hyperlipidemia    Hypertension    Urine incontinence    Past Surgical History:  Procedure Laterality Date   BREAST BIOPSY Left 02/2005   neg   COLONOSCOPY     Dr. Tamea [place], [place] 2013   LEEP     TONSILLECTOMY AND ADENOIDECTOMY     WRIST SURGERY Left    Patient Active Problem List   Diagnosis Date Noted   Joint pain 04/13/2024   Neck pain 11/11/2023   Daily headache 11/11/2023   Overactive bladder 09/30/2023   Uterovaginal prolapse, incomplete 09/30/2023   Chronic idiopathic constipation 09/30/2023   Incontinence of feces 09/30/2023   Dysplasia of cervix, low grade (CIN 1) 07/01/2023   Vaginal atrophy 07/01/2023   Mixed incontinence urge and stress 07/01/2023   Osteopenia after menopause 07/01/2023   H/O LEEP 07/01/2023   Impaired fasting glucose 03/04/2023   Vitamin D  deficiency 03/04/2023   Other fatigue 03/04/2023   Genetic testing 11/22/2022   Essential hypertension 08/19/2022   Tinnitus of both ears 08/19/2022   High grade squamous intraepithelial lesion (HGSIL), grade 3 CIN, on biopsy of cervix 07/11/2022   History of loop electrical excision procedure (LEEP) 07/11/2022   Chronic bilateral low back pain with bilateral sciatica 12/31/2021   SUI (stress urinary incontinence, female)  12/31/2021   Body mass index 28.0-28.9, adult 12/31/2021   Osteoarthritis 11/06/2018   Mixed hyperlipidemia 12/20/2014   Anxiety and depression 12/20/2014   Muscle tension headache 12/20/2014    PCP: Pamela Francis   REFERRING PROVIDER: Gayland Lauraine PARAS, NP  REFERRING DIAG: M54.2 (ICD-10-CM) - Neck pain R51.9 (ICD-10-CM) - Daily headache M54.2 (ICD-10-CM) - Cervicalgia  THERAPY DIAG:  Cervicalgia  Cervicogenic headache  Rationale for Evaluation and Treatment: Rehabilitation  ONSET DATE: 04/01/2024 (Date PT referral signed. Chronic condition)  SUBJECTIVE:  SUBJECTIVE STATEMENT: Neck hurts 7/10 currently. Headache is not as bad this morning. Neck was sore at work after last session.     Hand dominance: Left  PERTINENT HISTORY:  Cervicalgia, headaches. Gradual onset, for a while. Pamela Francis also states feeling B UE paresthesias to her 5th digits (along the ulnar nerve/C8/T1 dermatome). Headaches located suboccipital area and posterior neck as well as the sinus areas.    No latex allergies.         PAIN:  Are you having pain? Yes: NPRS scale: /10 currently neck pain and headache.  Pain location: posterior neck, suboccipital area and B upper trap area Pain description: tight, sore Aggravating factors: cervical rotation R and L, cervical flexion and extension.  Relieving factors: heat  PRECAUTIONS: OSTEOPENIA  RED FLAGS: Bowel or bladder incontinence: Yes: bladder (doctor aware) and Cauda equina syndrome: No     WEIGHT BEARING RESTRICTIONS: No  FALLS:  Has patient fallen in last 6 months? No  LIVING ENVIRONMENT: Lives with: lives with their spouse Lives in: House/apartment Stairs: Yes: External: 3 steps; on left going up Has following equipment at home:  None  OCCUPATION: Hair Dresser at [employer]  PLOF: Independent  PATIENT GOALS: move her neck without it hurting and get rid of the headaches.   NEXT MD VISIT: January 2026  OBJECTIVE:  Note: Objective measures were completed at Evaluation unless otherwise noted.  DIAGNOSTIC FINDINGS:  MR CERVICAL SPINE WO CONTRAST 11/19/2023  Narrative & Impression  [facility]   NEUROIMAGING REPORT     STUDY DATE: 11/19/23 PATIENT NAME: Pamela Francis DOB: [birth year] MRN: [id number]   ORDERING CLINICIAN: Onita Duos, MD  CLINICAL HISTORY: 63 y.o. year old female with: 1. Neck pain   2. Daily headache   3. Cervicalgia       EXAM: MR CERVICAL SPINE WO CONTRAST  TECHNIQUE: MRI of the cervical spine was obtained utilizing multiplanar, multiecho pulse sequences. CONTRAST:  Diagnostic Product Medications (last 72 hours)       None         COMPARISON: none   IMAGING SITE: [facility] [address]     [address] [place] [address] [phone]       FINDINGS:    On sagittal views the vertebral bodies have normal height and alignment.  The spinal cord is normal in size and appearance. The posterior fossa, pituitary gland and paraspinal soft tissues are unremarkable.     On axial views: C2-3 disc bulging with no spinal stenosis or foraminal narrowing C3-4 disc bulging facet hypertrophy with severe bilateral foraminal stenosis C4-5 this bulging and facet artery with moderate right and mild left foraminal stenosis C5-6 disc bulging and facet hypertrophy with severe right and moderate left foraminal stenosis C6-7 disc bulging and facet hypertrophy with severe left foraminal stenosis C7-T1 disc bulging and facet hypertrophy with moderate bilateral foraminal stenosis   Limited views of the soft tissues of the head and neck are unremarkable.     IMPRESSION:    MRI cervical spine without  contrast demonstrating: -Multilevel disc bulging and facet hypertrophy with moderate to severe foraminal stenosis from C3-4 down to C7-T1 as above. -No spinal stenosis or cord signal abnormality.       INTERPRETING PHYSICIAN:  Pamela FABIENE HANLON, MD Certified in Neurology, Neurophysiology and Neuroimaging   [facility] [address] [place], [place] [address] [phone]      MR BRAIN WO CONTRAST 11/19/2023  Narrative & Impression  [facility]   NEUROIMAGING REPORT     STUDY DATE: 11/19/23 PATIENT NAME: Pamela Francis DOB: 1961/02/12 MRN: [id number]   ORDERING CLINICIAN: Onita Duos, MD  CLINICAL HISTORY: 63 y.o. year old female with: 1. Neck pain   2. Daily headache   3. Cervicalgia       EXAM: MR BRAIN WO CONTRAST  TECHNIQUE: MRI of the brain without contrast was obtained utilizing multiplanar, multiecho pulse sequences. CONTRAST:  Diagnostic Product Medications (last 72 hours)       None         COMPARISON: none   IMAGING SITE: [facility] [address]     [address] [place] [address] [phone]       FINDINGS:    No abnormal lesions are seen on diffusion-weighted views to suggest acute ischemia. The cortical sulci, fissures and cisterns are normal in size and appearance. Lateral, third and fourth ventricle are normal in size and appearance. No extra-axial fluid collections are seen. No evidence of mass effect or midline shift.  Minimal punctate periventricular subcortical foci of nonspecific T2 hyperintensities.       On sagittal views the posterior fossa, pituitary gland and corpus callosum are unremarkable. No evidence of intracranial hemorrhage on gradient-echo views. The orbits and their contents, paranasal sinuses and calvarium are unremarkable.  Intracranial flow voids are present.     IMPRESSION:    Unremarkable MRI brain  (without). No acute findings.          INTERPRETING PHYSICIAN:  Pamela FABIENE HANLON, MD Certified in Neurology, Neurophysiology and Neuroimaging   [facility] [address] [place], [place] [address] [phone]        PATIENT SURVEYS:  NDI:  NECK DISABILITY INDEX  Date: 04/07/2024 Score  Pain intensity 3 = The pain is fairly severe at the moment  2. Personal care (washing, dressing, etc.) 0 = I can look after myself normally without causing extra pain  3. Lifting 1 =  I can lift heavy weights but it gives extra pain  4. Reading 2 =  I can read as much as I want with moderate pain in my neck  5. Headaches 4 = I have severe headaches, which come frequently   6. Concentration 1 =  I can concentrate fully when I want to with slight difficulty   7. Work 2 = I can do most of my usual work, but no more  8. Driving 1 =  I can drive my car as long as I want with slight pain in my neck  9. Sleeping 2 = My sleep is mildly disturbed (1-2 hrs sleepless)  10. Recreation 2 = I am able to engage in most, but not all of my usual recreation activities because of   pain in my neck  Total 18/50 (36%)   Minimum Detectable Change (90% confidence): 5 points or 10% points  COGNITION: Overall cognitive status: Within functional limits for tasks assessed  SENSATION:   POSTURE: forward neck, B protracted shoulders, slight L trunk lateral shift, slight upper thoracic kyphosis, R shoulder lower, movement crease around C5/6 and C6/7 areas  PALPATION: TTP suboccipital area and upper cervical spine. Muscle tension suboccipital area and B cervical paraspinal, and B upper trap area.    CERVICAL ROM:   Active ROM A/PROM (deg) eval  Flexion WFL with posterior cervical pulling  Extension Limited with B scalene and sternocleidomastoid muscle area  pain.   Right lateral flexion WFL with R lateral neck pain  Left lateral flexion WFL with L lateral neck pain  Right rotation  55 with L posterior lateral neck pain to shoulder (50 in supine)  Left rotation 30 with L posterior lateral neck pain > R posterior lateral neck pain. (53 in supine with L cervical side bend)   (Blank rows = not tested)  UPPER EXTREMITY ROM:  Active ROM Right eval Left eval  Shoulder flexion    Shoulder extension    Shoulder abduction    Shoulder adduction    Shoulder extension    Shoulder internal rotation    Shoulder external rotation    Elbow flexion    Elbow extension    Wrist flexion    Wrist extension    Wrist ulnar deviation    Wrist radial deviation    Wrist pronation    Wrist supination     (Blank rows = not tested)  UPPER EXTREMITY MMT:  MMT Right eval Left eval  Shoulder flexion 4 4-  Shoulder extension 4 4  Shoulder abduction    Shoulder adduction    Shoulder extension    Shoulder internal rotation    Shoulder external rotation    Middle trapezius    Lower trapezius    Elbow flexion 4- 4-  Elbow extension 4 4-  Wrist flexion    Wrist extension 4 4-  Wrist ulnar deviation    Wrist radial deviation    Wrist pronation    Wrist supination    Grip strength     (Blank rows = not tested)  CERVICAL SPECIAL TESTS:    FUNCTIONAL TESTS:    TREATMENT DATE: 05/08/2024                                                                                                                                 Therapeutic exercises  Standing B scapular retraction red band 10x5 seconds for 3 sets  Seated thoracic extension 10x3 with 5 second holds   Seated chin tucks with scapular retraction to promote gentle cervical and thoracic extension 10x5 seconds for 3 sets   Decreased neck pain to 5/10 after aforementioned exercises   Seated press-ups 10x with 5 second holds, then 10x2 no holds  Standing B shoulder extension with scapular retraction 10x5 seconds for 3 sets   Seated B horizontal abduction to promote thoracic extension 10x5 seconds for 2  sets    Improved exercise technique, movement at target joints, use of target muscles after mod verbal, visual, tactile cues.    PATIENT EDUCATION:  Education details: POC Person educated: Patient Education method: Explanation Education comprehension: verbalized understanding  HOME EXERCISE PROGRAM: Access Code: [email or web address] URL: [email or web address] Date: 04/17/2024 Prepared by:   Exercises - Supine Chin Tuck  - 1 x daily - 4-5 x weekly - 2 sets - 8 reps - Seated Upper Trapezius Stretch  - 1 x daily -  7 x weekly - 1 sets - 3 reps - 30 hold - Scapular Retraction with Resistance  - 1 x daily - 7 x weekly - 2 sets - 12 reps  - Seated Thoracic Lumbar Extension  - 1 x daily - 7 x weekly - 3 sets - 10 reps - 5 seconds hold  - Seated Cervical Retraction  - 5 x daily - 7 x weekly - 3 sets - 10 reps - 5 seconds hold    ASSESSMENT:  CLINICAL IMPRESSION: Focused on promoting thoracic extension, gentle cervical extension, as well as scapular strengthening to decrease stress to affected areas of the neck. Decreased neck pain to 5/10 after session reported.  Pt will benefit from skilled physical therapy services to address the aforementioned deficits.      OBJECTIVE IMPAIRMENTS: decreased ROM, decreased strength, improper body mechanics, postural dysfunction, and pain.   ACTIVITY LIMITATIONS: looking around  PARTICIPATION LIMITATIONS:   PERSONAL FACTORS: Fitness, Past/current experiences, Profession, Time since onset of injury/illness/exacerbation, and 3+ comorbidities: anxiety, depression, HTN are also affecting patient's functional outcome.   REHAB POTENTIAL: Fair    CLINICAL DECISION MAKING: Stable/uncomplicated  EVALUATION COMPLEXITY: Low   GOALS: Goals reviewed with patient? Yes  SHORT TERM GOALS: Target date: 04/17/2024  Pt will be independent with her initial HEP to decrease pain, improve cervical posture, UE strength and function.  Baseline: Pt has not  yet started her initial HEP (04/07/2024) Goal status: INITIAL  LONG TERM GOALS: Target date: 06/05/2024  Pt will have a decrease in neck pain at worst to 3/10 or less to promote ability to look around, perform her job more comfortably.  Baseline: At least 7/10 at worst for the past 3 months (04/07/2024) Goal status: INITIAL  2.  Pt will improve her Neck Disability Index Score by at least 10% as a demonstration of improved function.  Baseline: Neck Disability Index Score 18/50 (36%) (04/07/2024) Goal status: INITIAL  3. Pt will improve R and L cervical rotation AROM in the upright position by at least 10 degrees to promote ability to look around with less difficulty.  Baseline:  Active ROM A/PROM (deg) eval  Right rotation 55 with L posterior lateral neck pain to shoulder (50 in supine)  Left rotation 30 with L posterior lateral neck pain > R posterior lateral neck pain. (53 in supine with L cervical side bend)   Goal status: INITIAL    PLAN:  PT FREQUENCY: 1-2x/week  PT DURATION: 8 weeks  PLANNED INTERVENTIONS: 97110-Therapeutic exercises, 97530- Therapeutic activity, [id number]- Neuromuscular re-education, 97535- Self Care, 02859- Manual therapy, [id number]- Electrical stimulation (unattended), 02987- Traction (mechanical), 97033- Ionotophoresis 4mg /ml Dexamethasone, Patient/Family education, Joint mobilization, and Spinal mobilization  PLAN FOR NEXT SESSION: F/u on response to HEP. Posture, scapular and anterior cervical strengthening, cervical and scapular mechanics, manual technique, modalities PRN   Shizue Kaseman, PT, DPT Physical Therapist- [facility]  [facility] 05/08/2024, 9:07 AM

## 2024-05-11 ENCOUNTER — Encounter

## 2024-05-12 ENCOUNTER — Ambulatory Visit

## 2024-05-12 DIAGNOSIS — M542 Cervicalgia: Secondary | ICD-10-CM | POA: Diagnosis not present

## 2024-05-12 DIAGNOSIS — G4486 Cervicogenic headache: Secondary | ICD-10-CM | POA: Diagnosis not present

## 2024-05-12 NOTE — Therapy (Signed)
 OUTPATIENT PHYSICAL THERAPY CERVICAL TREATMENT   Patient Name: Ahria Slappey MRN: [id number] DOB:06/06/1961, 63 y.o., female Today's Date: 05/12/2024  END OF SESSION:  PT End of Session - 05/12/24 0817     Visit Number 7    Number of Visits 17    Date for PT Re-Evaluation 06/05/24    PT Start Time 0817    PT Stop Time 0901    PT Time Calculation (min) 44 min    Activity Tolerance Patient limited by pain;Patient tolerated treatment well    Behavior During Therapy [facility] for tasks assessed/performed               Past Medical History:  Diagnosis Date   Allergy    Anxiety    Arthritis    Blood in stool    Chicken pox    Depression    Frequent headaches    Hyperlipidemia    Hypertension    Urine incontinence    Past Surgical History:  Procedure Laterality Date   BREAST BIOPSY Left 02/2005   neg   COLONOSCOPY     Dr. Tamea [place], [place] 2013   LEEP     TONSILLECTOMY AND ADENOIDECTOMY     WRIST SURGERY Left    Patient Active Problem List   Diagnosis Date Noted   Joint pain 04/13/2024   Neck pain 11/11/2023   Daily headache 11/11/2023   Overactive bladder 09/30/2023   Uterovaginal prolapse, incomplete 09/30/2023   Chronic idiopathic constipation 09/30/2023   Incontinence of feces 09/30/2023   Dysplasia of cervix, low grade (CIN 1) 07/01/2023   Vaginal atrophy 07/01/2023   Mixed incontinence urge and stress 07/01/2023   Osteopenia after menopause 07/01/2023   H/O LEEP 07/01/2023   Impaired fasting glucose 03/04/2023   Vitamin D  deficiency 03/04/2023   Other fatigue 03/04/2023   Genetic testing 11/22/2022   Essential hypertension 08/19/2022   Tinnitus of both ears 08/19/2022   High grade squamous intraepithelial lesion (HGSIL), grade 3 CIN, on biopsy of cervix 07/11/2022   History of loop electrical excision procedure (LEEP) 07/11/2022   Chronic bilateral low back pain with bilateral sciatica 12/31/2021   SUI (stress urinary incontinence, female)  12/31/2021   Body mass index 28.0-28.9, adult 12/31/2021   Osteoarthritis 11/06/2018   Mixed hyperlipidemia 12/20/2014   Anxiety and depression 12/20/2014   Muscle tension headache 12/20/2014    PCP: Gayle Saddie [name]   REFERRING PROVIDER: Gayland Lauraine PARAS, NP  REFERRING DIAG: M54.2 (ICD-10-CM) - Neck pain R51.9 (ICD-10-CM) - Daily headache M54.2 (ICD-10-CM) - Cervicalgia  THERAPY DIAG:  Cervicalgia  Cervicogenic headache  Rationale for Evaluation and Treatment: Rehabilitation  ONSET DATE: 04/01/2024 (Date PT referral signed. Chronic condition)  SUBJECTIVE:  SUBJECTIVE STATEMENT: Neck is about 5/10 currently. A little headache currently. Neck did not bother her as much at work after last session.    Hand dominance: Left  PERTINENT HISTORY:  Cervicalgia, headaches. Gradual onset, for a while. [name] also states feeling B UE paresthesias to her 5th digits (along the ulnar nerve/C8/T1 dermatome). Headaches located suboccipital area and posterior neck as well as the sinus areas.    No latex allergies.         PAIN:  Are you having pain? Yes: NPRS scale: /10 currently neck pain and headache.  Pain location: posterior neck, suboccipital area and B upper trap area Pain description: tight, sore Aggravating factors: cervical rotation R and L, cervical flexion and extension.  Relieving factors: heat  PRECAUTIONS: OSTEOPENIA  RED FLAGS: Bowel or bladder incontinence: Yes: bladder (doctor aware) and Cauda equina syndrome: No     WEIGHT BEARING RESTRICTIONS: No  FALLS:  Has patient fallen in last 6 months? No  LIVING ENVIRONMENT: Lives with: lives with their spouse Lives in: House/apartment Stairs: Yes: External: 3 steps; on left going up Has following equipment at home:  None  OCCUPATION: Hair Dresser at [employer]  PLOF: Independent  PATIENT GOALS: move her neck without it hurting and get rid of the headaches.   NEXT MD VISIT: January 2026  OBJECTIVE:  Note: Objective measures were completed at Evaluation unless otherwise noted.  DIAGNOSTIC FINDINGS:  MR CERVICAL SPINE WO CONTRAST 11/19/2023  Narrative & Impression  [facility]   NEUROIMAGING REPORT     STUDY DATE: 11/19/23 PATIENT NAME: Linh Hedberg DOB: 09-30-1960 MRN: [id number]   ORDERING CLINICIAN: Onita Duos, MD  CLINICAL HISTORY: 63 y.o. year old female with: 1. Neck pain   2. Daily headache   3. Cervicalgia       EXAM: MR CERVICAL SPINE WO CONTRAST  TECHNIQUE: MRI of the cervical spine was obtained utilizing multiplanar, multiecho pulse sequences. CONTRAST:  Diagnostic Product Medications (last 72 hours)       None         COMPARISON: none   IMAGING SITE: [facility] [address]     [address] [place] [address] [phone]       FINDINGS:    On sagittal views the vertebral bodies have normal height and alignment.  The spinal cord is normal in size and appearance. The posterior fossa, pituitary gland and paraspinal soft tissues are unremarkable.     On axial views: C2-3 disc bulging with no spinal stenosis or foraminal narrowing C3-4 disc bulging facet hypertrophy with severe bilateral foraminal stenosis C4-5 this bulging and facet artery with moderate right and mild left foraminal stenosis C5-6 disc bulging and facet hypertrophy with severe right and moderate left foraminal stenosis C6-7 disc bulging and facet hypertrophy with severe left foraminal stenosis C7-T1 disc bulging and facet hypertrophy with moderate bilateral foraminal stenosis   Limited views of the soft tissues of the head and neck are unremarkable.     IMPRESSION:    MRI cervical spine without  contrast demonstrating: -Multilevel disc bulging and facet hypertrophy with moderate to severe foraminal stenosis from C3-4 down to C7-T1 as above. -No spinal stenosis or cord signal abnormality.       INTERPRETING PHYSICIAN:  EDUARD FABIENE HANLON, MD Certified in Neurology, Neurophysiology and Neuroimaging   [facility] [address], [address] [place], [place] [address] [phone]      MR BRAIN WO CONTRAST 11/19/2023  Narrative & Impression  [facility]   NEUROIMAGING REPORT     STUDY DATE: 11/19/23 PATIENT NAME: Sheralee Qazi DOB: [birth year] MRN: [id number]   ORDERING CLINICIAN: Onita Duos, MD  CLINICAL HISTORY: 63 y.o. year old female with: 1. Neck pain   2. Daily headache   3. Cervicalgia       EXAM: MR BRAIN WO CONTRAST  TECHNIQUE: MRI of the brain without contrast was obtained utilizing multiplanar, multiecho pulse sequences. CONTRAST:  Diagnostic Product Medications (last 72 hours)       None         COMPARISON: none   IMAGING SITE: [facility] [address]     [address] [place] [address] [phone]       FINDINGS:    No abnormal lesions are seen on diffusion-weighted views to suggest acute ischemia. The cortical sulci, fissures and cisterns are normal in size and appearance. Lateral, third and fourth ventricle are normal in size and appearance. No extra-axial fluid collections are seen. No evidence of mass effect or midline shift.  Minimal punctate periventricular subcortical foci of nonspecific T2 hyperintensities.       On sagittal views the posterior fossa, pituitary gland and corpus callosum are unremarkable. No evidence of intracranial hemorrhage on gradient-echo views. The orbits and their contents, paranasal sinuses and calvarium are unremarkable.  Intracranial flow voids are present.     IMPRESSION:    Unremarkable MRI brain  (without). No acute findings.          INTERPRETING PHYSICIAN:  EDUARD FABIENE HANLON, MD Certified in Neurology, Neurophysiology and Neuroimaging   [facility] [address] [place], [place] [address] [phone]        PATIENT SURVEYS:  NDI:  NECK DISABILITY INDEX  Date: 04/07/2024 Score  Pain intensity 3 = The pain is fairly severe at the moment  2. Personal care (washing, dressing, etc.) 0 = I can look after myself normally without causing extra pain  3. Lifting 1 =  I can lift heavy weights but it gives extra pain  4. Reading 2 =  I can read as much as I want with moderate pain in my neck  5. Headaches 4 = I have severe headaches, which come frequently   6. Concentration 1 =  I can concentrate fully when I want to with slight difficulty   7. Work 2 = I can do most of my usual work, but no more  8. Driving 1 =  I can drive my car as long as I want with slight pain in my neck  9. Sleeping 2 = My sleep is mildly disturbed (1-2 hrs sleepless)  10. Recreation 2 = I am able to engage in most, but not all of my usual recreation activities because of   pain in my neck  Total 18/50 (36%)   Minimum Detectable Change (90% confidence): 5 points or 10% points  COGNITION: Overall cognitive status: Within functional limits for tasks assessed  SENSATION:   POSTURE: forward neck, B protracted shoulders, slight L trunk lateral shift, slight upper thoracic kyphosis, R shoulder lower, movement crease around C5/6 and C6/7 areas  PALPATION: TTP suboccipital area and upper cervical spine. Muscle tension suboccipital area and B cervical paraspinal, and B upper trap area.    CERVICAL ROM:   Active ROM A/PROM (deg) eval  Flexion WFL with posterior cervical pulling  Extension Limited with B scalene and sternocleidomastoid muscle area  pain.   Right lateral flexion WFL with R lateral neck pain  Left lateral flexion WFL with L lateral neck pain  Right rotation  55 with L posterior lateral neck pain to shoulder (50 in supine)  Left rotation 30 with L posterior lateral neck pain > R posterior lateral neck pain. (53 in supine with L cervical side bend)   (Blank rows = not tested)  UPPER EXTREMITY ROM:  Active ROM Right eval Left eval  Shoulder flexion    Shoulder extension    Shoulder abduction    Shoulder adduction    Shoulder extension    Shoulder internal rotation    Shoulder external rotation    Elbow flexion    Elbow extension    Wrist flexion    Wrist extension    Wrist ulnar deviation    Wrist radial deviation    Wrist pronation    Wrist supination     (Blank rows = not tested)  UPPER EXTREMITY MMT:  MMT Right eval Left eval  Shoulder flexion 4 4-  Shoulder extension 4 4  Shoulder abduction    Shoulder adduction    Shoulder extension    Shoulder internal rotation    Shoulder external rotation    Middle trapezius    Lower trapezius    Elbow flexion 4- 4-  Elbow extension 4 4-  Wrist flexion    Wrist extension 4 4-  Wrist ulnar deviation    Wrist radial deviation    Wrist pronation    Wrist supination    Grip strength     (Blank rows = not tested)  CERVICAL SPECIAL TESTS:    FUNCTIONAL TESTS:    TREATMENT DATE: 05/12/2024                                                                                                                                Neuromuscular re education Performed with the intent on improving posture an decreasing stress to her neck.    Standing B scapular retraction red band 10x5 seconds for 3 sets  Seated thoracic extension 10x3 with 5 second holds   Standing chin tucks against wall with B scapular retraction 10x5 seconds for 2 sets   Standing with back against corner wall  B shoulder horizontal abduction with chin tuck to promote thoracic extension and decrease cervical stress 10x5 seconds for 2 sets  Seated press-ups 10x with 5 second holds, then 10x2 no holds  Standing B  shoulder extension with scapular retraction 10x5 seconds for 3 sets red band  Seated thoracic extension 10x5 seconds for 2 sets   Decreased neck pain.     Improved exercise technique, movement at target joints, use of target muscles after mod verbal, visual, tactile cues.    Manual therapy   Seated STM R rhomboid muscle to decrease tenesion   Improved neck comfort afterwards reported.     PATIENT EDUCATION:  Education details: POC Person educated:  Patient Education method: Explanation Education comprehension: verbalized understanding  HOME EXERCISE PROGRAM: Access Code: [email or web address] URL: [email or web address] Date: 04/17/2024 Prepared by:   Exercises - Supine Chin Tuck  - 1 x daily - 4-5 x weekly - 2 sets - 8 reps - Seated Upper Trapezius Stretch  - 1 x daily - 7 x weekly - 1 sets - 3 reps - 30 hold - Scapular Retraction with Resistance  - 1 x daily - 7 x weekly - 2 sets - 12 reps  - Seated Thoracic Lumbar Extension  - 1 x daily - 7 x weekly - 3 sets - 10 reps - 5 seconds hold  - Seated Cervical Retraction  - 5 x daily - 7 x weekly - 3 sets - 10 reps - 5 seconds hold    ASSESSMENT:  CLINICAL IMPRESSION: Continued promoting thoracic extension, gentle cervical extension, as well as scapular strengthening to decrease stress to affected areas of the neck. Decreased neck pain reporte after treatment.  Pt will benefit from skilled physical therapy services to address the aforementioned deficits.      OBJECTIVE IMPAIRMENTS: decreased ROM, decreased strength, improper body mechanics, postural dysfunction, and pain.   ACTIVITY LIMITATIONS: looking around  PARTICIPATION LIMITATIONS:   PERSONAL FACTORS: Fitness, Past/current experiences, Profession, Time since onset of injury/illness/exacerbation, and 3+ comorbidities: anxiety, depression, HTN are also affecting patient's functional outcome.   REHAB POTENTIAL: Fair    CLINICAL DECISION MAKING:  Stable/uncomplicated  EVALUATION COMPLEXITY: Low   GOALS: Goals reviewed with patient? Yes  SHORT TERM GOALS: Target date: 04/17/2024  Pt will be independent with her initial HEP to decrease pain, improve cervical posture, UE strength and function.  Baseline: Pt has not yet started her initial HEP (04/07/2024) Goal status: INITIAL  LONG TERM GOALS: Target date: 06/05/2024  Pt will have a decrease in neck pain at worst to 3/10 or less to promote ability to look around, perform her job more comfortably.  Baseline: At least 7/10 at worst for the past 3 months (04/07/2024) Goal status: INITIAL  2.  Pt will improve her Neck Disability Index Score by at least 10% as a demonstration of improved function.  Baseline: Neck Disability Index Score 18/50 (36%) (04/07/2024) Goal status: INITIAL  3. Pt will improve R and L cervical rotation AROM in the upright position by at least 10 degrees to promote ability to look around with less difficulty.  Baseline:  Active ROM A/PROM (deg) eval  Right rotation 55 with L posterior lateral neck pain to shoulder (50 in supine)  Left rotation 30 with L posterior lateral neck pain > R posterior lateral neck pain. (53 in supine with L cervical side bend)   Goal status: INITIAL    PLAN:  PT FREQUENCY: 1-2x/week  PT DURATION: 8 weeks  PLANNED INTERVENTIONS: 97110-Therapeutic exercises, 97530- Therapeutic activity, [id number]- Neuromuscular re-education, 97535- Self Care, 02859- Manual therapy, [id number]- Electrical stimulation (unattended), 02987- Traction (mechanical), [id number]- Ionotophoresis 4mg /ml Dexamethasone, Patient/Family education, Joint mobilization, and Spinal mobilization  PLAN FOR NEXT SESSION: F/u on response to HEP. Posture, scapular and anterior cervical strengthening, cervical and scapular mechanics, manual technique, modalities PRN   Tiaunna Buford, PT, DPT Physical Therapist- [place]  [facility] 05/12/2024, 10:59 AM

## 2024-05-14 ENCOUNTER — Encounter

## 2024-05-15 ENCOUNTER — Ambulatory Visit

## 2024-05-15 DIAGNOSIS — G4486 Cervicogenic headache: Secondary | ICD-10-CM | POA: Diagnosis not present

## 2024-05-15 DIAGNOSIS — M542 Cervicalgia: Secondary | ICD-10-CM

## 2024-05-15 NOTE — Therapy (Signed)
 OUTPATIENT PHYSICAL THERAPY CERVICAL TREATMENT   Patient Name: Pamela Francis MRN: [id number] DOB:June 23, 1961, 63 y.o., female Today's Date: 05/15/2024  END OF SESSION:         Past Medical History:  Diagnosis Date   Allergy    Anxiety    Arthritis    Blood in stool    Chicken pox    Depression    Frequent headaches    Hyperlipidemia    Hypertension    Urine incontinence    Past Surgical History:  Procedure Laterality Date   BREAST BIOPSY Left 02/2005   neg   COLONOSCOPY     Dr. Tamea [place], [place] 2013   LEEP     TONSILLECTOMY AND ADENOIDECTOMY     WRIST SURGERY Left    Patient Active Problem List   Diagnosis Date Noted   Joint pain 04/13/2024   Neck pain 11/11/2023   Daily headache 11/11/2023   Overactive bladder 09/30/2023   Uterovaginal prolapse, incomplete 09/30/2023   Chronic idiopathic constipation 09/30/2023   Incontinence of feces 09/30/2023   Dysplasia of cervix, low grade (CIN 1) 07/01/2023   Vaginal atrophy 07/01/2023   Mixed incontinence urge and stress 07/01/2023   Osteopenia after menopause 07/01/2023   H/O LEEP 07/01/2023   Impaired fasting glucose 03/04/2023   Vitamin D  deficiency 03/04/2023   Other fatigue 03/04/2023   Genetic testing 11/22/2022   Essential hypertension 08/19/2022   Tinnitus of both ears 08/19/2022   High grade squamous intraepithelial lesion (HGSIL), grade 3 CIN, on biopsy of cervix 07/11/2022   History of loop electrical excision procedure (LEEP) 07/11/2022   Chronic bilateral low back pain with bilateral sciatica 12/31/2021   SUI (stress urinary incontinence, female) 12/31/2021   Body mass index 28.0-28.9, adult 12/31/2021   Osteoarthritis 11/06/2018   Mixed hyperlipidemia 12/20/2014   Anxiety and depression 12/20/2014   Muscle tension headache 12/20/2014    PCP: Pamela Francis Pamela Francis   REFERRING PROVIDER: Gayland Lauraine PARAS, NP  REFERRING DIAG: M54.2 (ICD-10-CM) - Neck pain R51.9 (ICD-10-CM) - Daily  headache M54.2 (ICD-10-CM) - Cervicalgia  THERAPY DIAG:  Cervicalgia  Cervicogenic headache  Rationale for Evaluation and Treatment: Rehabilitation  ONSET DATE: 04/01/2024 (Date PT referral signed. Chronic condition)  SUBJECTIVE:                                                                                                                                                                                                         SUBJECTIVE STATEMENT: Neck is a little bit sore, 5/10 currently. Has a headache.  Hand dominance: Left  PERTINENT HISTORY:  Cervicalgia, headaches. Gradual onset, for a while. Pamela Francis also states feeling B UE paresthesias to her 5th digits (along the ulnar nerve/C8/T1 dermatome). Headaches located suboccipital area and posterior neck as well as the sinus areas.    No latex allergies.         PAIN:  Are you having pain? Yes: NPRS scale: /10 currently neck pain and headache.  Pain location: posterior neck, suboccipital area and B upper trap area Pain description: tight, sore Aggravating factors: cervical rotation R and L, cervical flexion and extension.  Relieving factors: heat  PRECAUTIONS: OSTEOPENIA  RED FLAGS: Bowel or bladder incontinence: Yes: bladder (doctor aware) and Cauda equina syndrome: No     WEIGHT BEARING RESTRICTIONS: No  FALLS:  Has patient fallen in last 6 months? No  LIVING ENVIRONMENT: Lives with: lives with their spouse Lives in: House/apartment Stairs: Yes: External: 3 steps; on left going up Has following equipment at home: None  OCCUPATION: Hair Dresser at [employer]  PLOF: Independent  PATIENT GOALS: move her neck without it hurting and get rid of the headaches.   NEXT MD VISIT: January 2026  OBJECTIVE:  Note: Objective measures were completed at Evaluation unless otherwise noted.  DIAGNOSTIC FINDINGS:  MR CERVICAL SPINE WO CONTRAST 11/19/2023  Narrative & Impression  [facility]    NEUROIMAGING REPORT     STUDY DATE: 11/19/23 PATIENT NAME: Pamela Francis DOB: October 12, 1960 MRN: [id number]   ORDERING CLINICIAN: Onita Duos, MD  CLINICAL HISTORY: 63 y.o. year old female with: 1. Neck pain   2. Daily headache   3. Cervicalgia       EXAM: MR CERVICAL SPINE WO CONTRAST  TECHNIQUE: MRI of the cervical spine was obtained utilizing multiplanar, multiecho pulse sequences. CONTRAST:  Diagnostic Product Medications (last 72 hours)       None         COMPARISON: none   IMAGING SITE: [facility] [address]     [address] [place] [address] [phone]       FINDINGS:    On sagittal views the vertebral bodies have normal height and alignment.  The spinal cord is normal in size and appearance. The posterior fossa, pituitary gland and paraspinal soft tissues are unremarkable.     On axial views: C2-3 disc bulging with no spinal stenosis or foraminal narrowing C3-4 disc bulging facet hypertrophy with severe bilateral foraminal stenosis C4-5 this bulging and facet artery with moderate right and mild left foraminal stenosis C5-6 disc bulging and facet hypertrophy with severe right and moderate left foraminal stenosis C6-7 disc bulging and facet hypertrophy with severe left foraminal stenosis C7-T1 disc bulging and facet hypertrophy with moderate bilateral foraminal stenosis   Limited views of the soft tissues of the head and neck are unremarkable.     IMPRESSION:    MRI cervical spine without contrast demonstrating: -Multilevel disc bulging and facet hypertrophy with moderate to severe foraminal stenosis from C3-4 down to C7-T1 as above. -No spinal stenosis or cord signal abnormality.       INTERPRETING PHYSICIAN:  EDUARD FABIENE HANLON, MD Certified in Neurology, Neurophysiology and Neuroimaging   [facility] [address] [place], [place] [address] [phone]      MR BRAIN WO CONTRAST 11/19/2023  Narrative & Impression  [facility]   NEUROIMAGING REPORT     STUDY DATE: 11/19/23 PATIENT NAME: Pamela Francis DOB: 1961-03-08 MRN:  [id number]   ORDERING CLINICIAN: Onita Duos, MD  CLINICAL HISTORY: 63 y.o. year old female with: 1. Neck pain   2. Daily headache   3. Cervicalgia       EXAM: MR BRAIN WO CONTRAST  TECHNIQUE: MRI of the brain without contrast was obtained utilizing multiplanar, multiecho pulse sequences. CONTRAST:  Diagnostic Product Medications (last 72 hours)       None         COMPARISON: none   IMAGING SITE: [facility] [address]     [address] [place] [address] [phone]       FINDINGS:    No abnormal lesions are seen on diffusion-weighted views to suggest acute ischemia. The cortical sulci, fissures and cisterns are normal in size and appearance. Lateral, third and fourth ventricle are normal in size and appearance. No extra-axial fluid collections are seen. No evidence of mass effect or midline shift.  Minimal punctate periventricular subcortical foci of nonspecific T2 hyperintensities.       On sagittal views the posterior fossa, pituitary gland and corpus callosum are unremarkable. No evidence of intracranial hemorrhage on gradient-echo views. The orbits and their contents, paranasal sinuses and calvarium are unremarkable.  Intracranial flow voids are present.     IMPRESSION:    Unremarkable MRI brain (without). No acute findings.          INTERPRETING PHYSICIAN:  EDUARD FABIENE HANLON, MD Certified in Neurology, Neurophysiology and Neuroimaging   [facility] [address], [address] [place], [place] [address] [phone]        PATIENT SURVEYS:  NDI:  NECK DISABILITY INDEX  Date: 04/07/2024 Score  Pain intensity 3 = The pain is fairly severe at the moment  2. Personal  care (washing, dressing, etc.) 0 = I can look after myself normally without causing extra pain  3. Lifting 1 =  I can lift heavy weights but it gives extra pain  4. Reading 2 =  I can read as much as I want with moderate pain in my neck  5. Headaches 4 = I have severe headaches, which come frequently   6. Concentration 1 =  I can concentrate fully when I want to with slight difficulty   7. Work 2 = I can do most of my usual work, but no more  8. Driving 1 =  I can drive my car as long as I want with slight pain in my neck  9. Sleeping 2 = My sleep is mildly disturbed (1-2 hrs sleepless)  10. Recreation 2 = I am able to engage in most, but not all of my usual recreation activities because of   pain in my neck  Total 18/50 (36%)   Minimum Detectable Change (90% confidence): 5 points or 10% points  COGNITION: Overall cognitive status: Within functional limits for tasks assessed  SENSATION:   POSTURE: forward neck, B protracted shoulders, slight L trunk lateral shift, slight upper thoracic kyphosis, R shoulder lower, movement crease around C5/6 and C6/7 areas  PALPATION: TTP suboccipital area and upper cervical spine. Muscle tension suboccipital area and B cervical paraspinal, and B upper trap area.    CERVICAL ROM:   Active ROM A/PROM (deg) eval  Flexion WFL with posterior cervical pulling  Extension Limited with B scalene and sternocleidomastoid muscle area pain.   Right lateral flexion WFL with R lateral neck pain  Left lateral flexion WFL with L lateral neck pain  Right rotation  55 with L posterior lateral neck pain to shoulder (50 in supine)  Left rotation 30 with L posterior lateral neck pain > R posterior lateral neck pain. (53 in supine with L cervical side bend)   (Blank rows = not tested)  UPPER EXTREMITY ROM:  Active ROM Right eval Left eval  Shoulder flexion    Shoulder extension    Shoulder abduction    Shoulder adduction    Shoulder extension    Shoulder  internal rotation    Shoulder external rotation    Elbow flexion    Elbow extension    Wrist flexion    Wrist extension    Wrist ulnar deviation    Wrist radial deviation    Wrist pronation    Wrist supination     (Blank rows = not tested)  UPPER EXTREMITY MMT:  MMT Right eval Left eval  Shoulder flexion 4 4-  Shoulder extension 4 4  Shoulder abduction    Shoulder adduction    Shoulder extension    Shoulder internal rotation    Shoulder external rotation    Middle trapezius    Lower trapezius    Elbow flexion 4- 4-  Elbow extension 4 4-  Wrist flexion    Wrist extension 4 4-  Wrist ulnar deviation    Wrist radial deviation    Wrist pronation    Wrist supination    Grip strength     (Blank rows = not tested)  CERVICAL SPECIAL TESTS:    FUNCTIONAL TESTS:    TREATMENT DATE: 05/15/2024                                                                                                                                Neuromuscular re education Performed with the intent on improving posture an decreasing stress to her neck.   Cervical rotation   R 60 degrees, with pain  L 45 degrees  Seated first rib stretch with strap  With cervical rotation    R 10x3 Cervical AROM improved to R 70 degrees, L 60 degrees  L 10x3    Cervical AROM improved to R 70 degrees, L 62 degrees      Seated thoracic extension 10x5 seconds for 2 sets    Seated scapular depression isometrics, with forearm at arm rest  R 10x5 seconds for 2 sets  L 10x5 seconds for 2 sets     Improved exercise technique, movement at target joints, use of target muscles after mod verbal, visual, tactile cues.    Manual therapy  Seated caudal glide L and R first rib grade 3 to decrease stiffness and improve mobility  Seated STM R rhomboid muscle to decrease tenesion   Improved neck comfort afterwards reported.     PATIENT EDUCATION:  Education details: POC Person educated: Patient Education  method: Explanation Education comprehension: verbalized understanding  HOME EXERCISE PROGRAM: Access Code: [email or web address] URL: [email or web address] Date: 04/17/2024 Prepared by:  Exercises - Supine Chin Tuck  - 1 x daily - 4-5 x weekly - 2 sets - 8 reps - Seated Upper Trapezius Stretch  - 1 x daily - 7 x weekly - 1 sets - 3 reps - 30 hold - Scapular Retraction with Resistance  - 1 x daily - 7 x weekly - 2 sets - 12 reps  - Seated Thoracic Lumbar Extension  - 1 x daily - 7 x weekly - 3 sets - 10 reps - 5 seconds hold  - Seated Cervical Retraction  - 5 x daily - 7 x weekly - 3 sets - 10 reps - 5 seconds hold    ASSESSMENT:  CLINICAL IMPRESSION:    Continued promoting thoracic extension, gentle cervical extension, decreasing scalene muscle tension, improving first rib mobility as well as scapular strengthening to decrease stress to affected areas of the neck. Improved cervical rotation AROM and decreased neck pain reported after session.  Pt will benefit from skilled physical therapy services to address the aforementioned deficits.      OBJECTIVE IMPAIRMENTS: decreased ROM, decreased strength, improper body mechanics, postural dysfunction, and pain.   ACTIVITY LIMITATIONS: looking around  PARTICIPATION LIMITATIONS:   PERSONAL FACTORS: Fitness, Past/current experiences, Profession, Time since onset of injury/illness/exacerbation, and 3+ comorbidities: anxiety, depression, HTN are also affecting patient's functional outcome.   REHAB POTENTIAL: Fair    CLINICAL DECISION MAKING: Stable/uncomplicated  EVALUATION COMPLEXITY: Low   GOALS: Goals reviewed with patient? Yes  SHORT TERM GOALS: Target date: 04/17/2024  Pt will be independent with her initial HEP to decrease pain, improve cervical posture, UE strength and function.  Baseline: Pt has not yet started her initial HEP (04/07/2024) Goal status: INITIAL  LONG TERM GOALS: Target date: 06/05/2024  Pt will have a  decrease in neck pain at worst to 3/10 or less to promote ability to look around, perform her job more comfortably.  Baseline: At least 7/10 at worst for the past 3 months (04/07/2024) Goal status: INITIAL  2.  Pt will improve her Neck Disability Index Score by at least 10% as a demonstration of improved function.  Baseline: Neck Disability Index Score 18/50 (36%) (04/07/2024) Goal status: INITIAL  3. Pt will improve R and L cervical rotation AROM in the upright position by at least 10 degrees to promote ability to look around with less difficulty.  Baseline:  Active ROM A/PROM (deg) eval  Right rotation 55 with L posterior lateral neck pain to shoulder (50 in supine)  Left rotation 30 with L posterior lateral neck pain > R posterior lateral neck pain. (53 in supine with L cervical side bend)   Goal status: INITIAL    PLAN:  PT FREQUENCY: 1-2x/week  PT DURATION: 8 weeks  PLANNED INTERVENTIONS: 97110-Therapeutic exercises, 97530- Therapeutic activity, [id number]- Neuromuscular re-education, 97535- Self Care, [id number]- Manual therapy, [id number]- Electrical stimulation (unattended), 02987- Traction (mechanical), [id number]- Ionotophoresis 4mg /ml Dexamethasone, Patient/Family education, Joint mobilization, and Spinal mobilization  PLAN FOR NEXT SESSION: F/u on response to HEP. Posture, scapular and anterior cervical strengthening, cervical and scapular mechanics, manual technique, modalities PRN   Kelsy Polack, PT, DPT Physical Therapist- [facility]  [facility] 05/15/2024, 7:32 AM

## 2024-05-19 ENCOUNTER — Encounter

## 2024-05-19 ENCOUNTER — Ambulatory Visit

## 2024-05-21 ENCOUNTER — Ambulatory Visit: Attending: Neurology

## 2024-05-21 DIAGNOSIS — M542 Cervicalgia: Secondary | ICD-10-CM | POA: Diagnosis not present

## 2024-05-21 DIAGNOSIS — G4486 Cervicogenic headache: Secondary | ICD-10-CM | POA: Diagnosis not present

## 2024-05-21 NOTE — Therapy (Signed)
 OUTPATIENT PHYSICAL THERAPY CERVICAL TREATMENT   Patient Name: Pamela Francis MRN: [id number] DOB:June 13, 1961, 63 y.o., female Today's Date: 05/21/2024  END OF SESSION:  PT End of Session - 05/21/24 0736     Visit Number 9    Number of Visits 17    Date for PT Re-Evaluation 06/05/24    PT Start Time 0736    PT Stop Time 0814    PT Time Calculation (min) 38 min    Activity Tolerance Patient limited by pain;Patient tolerated treatment well    Behavior During Therapy [facility] for tasks assessed/performed                Past Medical History:  Diagnosis Date   Allergy    Anxiety    Arthritis    Blood in stool    Chicken pox    Depression    Frequent headaches    Hyperlipidemia    Hypertension    Urine incontinence    Past Surgical History:  Procedure Laterality Date   BREAST BIOPSY Left 02/2005   neg   COLONOSCOPY     Dr. Tamea [place], [place] 2013   LEEP     TONSILLECTOMY AND ADENOIDECTOMY     WRIST SURGERY Left    Patient Active Problem List   Diagnosis Date Noted   Joint pain 04/13/2024   Neck pain 11/11/2023   Daily headache 11/11/2023   Overactive bladder 09/30/2023   Uterovaginal prolapse, incomplete 09/30/2023   Chronic idiopathic constipation 09/30/2023   Incontinence of feces 09/30/2023   Dysplasia of cervix, low grade (CIN 1) 07/01/2023   Vaginal atrophy 07/01/2023   Mixed incontinence urge and stress 07/01/2023   Osteopenia after menopause 07/01/2023   H/O LEEP 07/01/2023   Impaired fasting glucose 03/04/2023   Vitamin D  deficiency 03/04/2023   Other fatigue 03/04/2023   Genetic testing 11/22/2022   Essential hypertension 08/19/2022   Tinnitus of both ears 08/19/2022   High grade squamous intraepithelial lesion (HGSIL), grade 3 CIN, on biopsy of cervix 07/11/2022   History of loop electrical excision procedure (LEEP) 07/11/2022   Chronic bilateral low back pain with bilateral sciatica 12/31/2021   SUI (stress urinary incontinence, female)  12/31/2021   Body mass index 28.0-28.9, adult 12/31/2021   Osteoarthritis 11/06/2018   Mixed hyperlipidemia 12/20/2014   Anxiety and depression 12/20/2014   Muscle tension headache 12/20/2014    PCP: Gayle Saddie [name]   REFERRING PROVIDER: Gayland Lauraine PARAS, NP  REFERRING DIAG: M54.2 (ICD-10-CM) - Neck pain R51.9 (ICD-10-CM) - Daily headache M54.2 (ICD-10-CM) - Cervicalgia  THERAPY DIAG:  Cervicalgia  Cervicogenic headache  Rationale for Evaluation and Treatment: Rehabilitation  ONSET DATE: 04/01/2024 (Date PT referral signed. Chronic condition)  SUBJECTIVE:  SUBJECTIVE STATEMENT: Neck is a little bit stiff, 5/10 currently. Has a little headache. Was ok after last session. The HEP helps when she does them.      Hand dominance: Left  PERTINENT HISTORY:  Cervicalgia, headaches. Gradual onset, for a while. [name] also states feeling B UE paresthesias to her 5th digits (along the ulnar nerve/C8/T1 dermatome). Headaches located suboccipital area and posterior neck as well as the sinus areas.    No latex allergies.         PAIN:  Are you having pain? Yes: NPRS scale: /10 currently neck pain and headache.  Pain location: posterior neck, suboccipital area and B upper trap area Pain description: tight, sore Aggravating factors: cervical rotation R and L, cervical flexion and extension.  Relieving factors: heat  PRECAUTIONS: OSTEOPENIA  RED FLAGS: Bowel or bladder incontinence: Yes: bladder (doctor aware) and Cauda equina syndrome: No     WEIGHT BEARING RESTRICTIONS: No  FALLS:  Has patient fallen in last 6 months? No  LIVING ENVIRONMENT: Lives with: lives with their spouse Lives in: House/apartment Stairs: Yes: External: 3 steps; on left going up Has following  equipment at home: None  OCCUPATION: Hair Dresser at [employer]  PLOF: Independent  PATIENT GOALS: move her neck without it hurting and get rid of the headaches.   NEXT MD VISIT: January 2026  OBJECTIVE:  Note: Objective measures were completed at Evaluation unless otherwise noted.  DIAGNOSTIC FINDINGS:  MR CERVICAL SPINE WO CONTRAST 11/19/2023  Narrative & Impression  [facility]   NEUROIMAGING REPORT     STUDY DATE: 11/19/23 PATIENT NAME: Pamela Francis DOB: 1961/07/26 MRN: [id number]   ORDERING CLINICIAN: Onita Duos, MD  CLINICAL HISTORY: 63 y.o. year old female with: 1. Neck pain   2. Daily headache   3. Cervicalgia       EXAM: MR CERVICAL SPINE WO CONTRAST  TECHNIQUE: MRI of the cervical spine was obtained utilizing multiplanar, multiecho pulse sequences. CONTRAST:  Diagnostic Product Medications (last 72 hours)       None         COMPARISON: none   IMAGING SITE: [facility] [address]     [address] [place] [address] [phone]       FINDINGS:    On sagittal views the vertebral bodies have normal height and alignment.  The spinal cord is normal in size and appearance. The posterior fossa, pituitary gland and paraspinal soft tissues are unremarkable.     On axial views: C2-3 disc bulging with no spinal stenosis or foraminal narrowing C3-4 disc bulging facet hypertrophy with severe bilateral foraminal stenosis C4-5 this bulging and facet artery with moderate right and mild left foraminal stenosis C5-6 disc bulging and facet hypertrophy with severe right and moderate left foraminal stenosis C6-7 disc bulging and facet hypertrophy with severe left foraminal stenosis C7-T1 disc bulging and facet hypertrophy with moderate bilateral foraminal stenosis   Limited views of the soft tissues of the head and neck are unremarkable.     IMPRESSION:    MRI cervical  spine without contrast demonstrating: -Multilevel disc bulging and facet hypertrophy with moderate to severe foraminal stenosis from C3-4 down to C7-T1 as above. -No spinal stenosis or cord signal abnormality.       INTERPRETING PHYSICIAN:  EDUARD FABIENE HANLON, MD Certified in Neurology, Neurophysiology and Neuroimaging   [facility] [address], [address] [place], [place] [address] [phone]  MR BRAIN WO CONTRAST 11/19/2023  Narrative & Impression  [facility]   NEUROIMAGING REPORT     STUDY DATE: 11/19/23 PATIENT NAME: Pamela Francis DOB: 11-25-1960 MRN: [id number]   ORDERING CLINICIAN: Onita Duos, MD  CLINICAL HISTORY: 63 y.o. year old female with: 1. Neck pain   2. Daily headache   3. Cervicalgia       EXAM: MR BRAIN WO CONTRAST  TECHNIQUE: MRI of the brain without contrast was obtained utilizing multiplanar, multiecho pulse sequences. CONTRAST:  Diagnostic Product Medications (last 72 hours)       None         COMPARISON: none   IMAGING SITE: [facility] [address]     [address] [place] [address] [phone]       FINDINGS:    No abnormal lesions are seen on diffusion-weighted views to suggest acute ischemia. The cortical sulci, fissures and cisterns are normal in size and appearance. Lateral, third and fourth ventricle are normal in size and appearance. No extra-axial fluid collections are seen. No evidence of mass effect or midline shift.  Minimal punctate periventricular subcortical foci of nonspecific T2 hyperintensities.       On sagittal views the posterior fossa, pituitary gland and corpus callosum are unremarkable. No evidence of intracranial hemorrhage on gradient-echo views. The orbits and their contents, paranasal sinuses and calvarium are unremarkable.  Intracranial flow voids are present.     IMPRESSION:    Unremarkable  MRI brain (without). No acute findings.          INTERPRETING PHYSICIAN:  EDUARD FABIENE HANLON, MD Certified in Neurology, Neurophysiology and Neuroimaging   [facility] [address] [place], [place] [address] [phone]        PATIENT SURVEYS:  NDI:  NECK DISABILITY INDEX  Date: 04/07/2024 Score  Pain intensity 3 = The pain is fairly severe at the moment  2. Personal care (washing, dressing, etc.) 0 = I can look after myself normally without causing extra pain  3. Lifting 1 =  I can lift heavy weights but it gives extra pain  4. Reading 2 =  I can read as much as I want with moderate pain in my neck  5. Headaches 4 = I have severe headaches, which come frequently   6. Concentration 1 =  I can concentrate fully when I want to with slight difficulty   7. Work 2 = I can do most of my usual work, but no more  8. Driving 1 =  I can drive my car as long as I want with slight pain in my neck  9. Sleeping 2 = My sleep is mildly disturbed (1-2 hrs sleepless)  10. Recreation 2 = I am able to engage in most, but not all of my usual recreation activities because of   pain in my neck  Total 18/50 (36%)   Minimum Detectable Change (90% confidence): 5 points or 10% points  COGNITION: Overall cognitive status: Within functional limits for tasks assessed  SENSATION:   POSTURE: forward neck, B protracted shoulders, slight L trunk lateral shift, slight upper thoracic kyphosis, R shoulder lower, movement crease around C5/6 and C6/7 areas  PALPATION: TTP suboccipital area and upper cervical spine. Muscle tension suboccipital area and B cervical paraspinal, and B upper trap area.    CERVICAL ROM:   Active ROM A/PROM (deg) eval  Flexion WFL with posterior cervical pulling  Extension Limited with  B scalene and sternocleidomastoid muscle area pain.   Right lateral flexion WFL with R lateral neck pain  Left lateral flexion WFL with L lateral neck pain  Right  rotation 55 with L posterior lateral neck pain to shoulder (50 in supine)  Left rotation 30 with L posterior lateral neck pain > R posterior lateral neck pain. (53 in supine with L cervical side bend)   (Blank rows = not tested)  UPPER EXTREMITY ROM:  Active ROM Right eval Left eval  Shoulder flexion    Shoulder extension    Shoulder abduction    Shoulder adduction    Shoulder extension    Shoulder internal rotation    Shoulder external rotation    Elbow flexion    Elbow extension    Wrist flexion    Wrist extension    Wrist ulnar deviation    Wrist radial deviation    Wrist pronation    Wrist supination     (Blank rows = not tested)  UPPER EXTREMITY MMT:  MMT Right eval Left eval  Shoulder flexion 4 4-  Shoulder extension 4 4  Shoulder abduction    Shoulder adduction    Shoulder extension    Shoulder internal rotation    Shoulder external rotation    Middle trapezius    Lower trapezius    Elbow flexion 4- 4-  Elbow extension 4 4-  Wrist flexion    Wrist extension 4 4-  Wrist ulnar deviation    Wrist radial deviation    Wrist pronation    Wrist supination    Grip strength     (Blank rows = not tested)  CERVICAL SPECIAL TESTS:    FUNCTIONAL TESTS:    TREATMENT DATE: 05/21/2024                                                                                                                                Neuromuscular re education Performed with the intent on improving posture an decreasing stress to her neck.   Seated first rib stretch with strap  With cervical rotation    R 10x3 L 10x3       Standing B shoulder extension with scapular retraction red band 10x5 seconds for 2 sets  Standing B shoulder horizontal abduction at corner wall 10x5 seconds for 3 sets to promote thoracic extension and decrease stress to neck.   OMEGA rows plate 15 for [id number]  To promote thoracic extension, and middle and lower trap strength.   Seated thoracic extension 10x5  seconds for 2 sets    Supine B shoulder flexion to promote thoracic extension 10x5 seconds for 3 sets  Supine cervical nod 10x5 seconds for 3 sets  To promote anterior cervical muscle activation  Improved exercise technique, movement at target joints, use of target muscles after mod verbal, visual, tactile cues.      PATIENT EDUCATION:  Education details: POC Person educated: Patient Education method: Explanation  Education comprehension: verbalized understanding  HOME EXERCISE PROGRAM: Access Code: [email or web address] URL: [email or web address] Date: 04/17/2024 Prepared by:   Exercises - Supine Chin Tuck  - 1 x daily - 4-5 x weekly - 2 sets - 8 reps - Seated Upper Trapezius Stretch  - 1 x daily - 7 x weekly - 1 sets - 3 reps - 30 hold - Scapular Retraction with Resistance  - 1 x daily - 7 x weekly - 2 sets - 12 reps  - Seated Thoracic Lumbar Extension  - 1 x daily - 7 x weekly - 3 sets - 10 reps - 5 seconds hold  - Seated Cervical Retraction  - 5 x daily - 7 x weekly - 3 sets - 10 reps - 5 seconds hold    ASSESSMENT:  CLINICAL IMPRESSION:  Decreased overall neck pain at worst based on subjective reports.  Continued promoting thoracic extension, gentle cervical extension, decreasing scalene muscle tension, improving first rib mobility as well as scapular strengthening to decrease stress to affected areas of the neck. Pt tolerated session well without aggravation of symtoms.  Pt will benefit from skilled physical therapy services to address the aforementioned deficits.      OBJECTIVE IMPAIRMENTS: decreased ROM, decreased strength, improper body mechanics, postural dysfunction, and pain.   ACTIVITY LIMITATIONS: looking around  PARTICIPATION LIMITATIONS:   PERSONAL FACTORS: Fitness, Past/current experiences, Profession, Time since onset of injury/illness/exacerbation, and 3+ comorbidities: anxiety, depression, HTN are also affecting patient's functional outcome.    REHAB POTENTIAL: Fair    CLINICAL DECISION MAKING: Stable/uncomplicated  EVALUATION COMPLEXITY: Low   GOALS: Goals reviewed with patient? Yes  SHORT TERM GOALS: Target date: 04/17/2024  Pt will be independent with her initial HEP to decrease pain, improve cervical posture, UE strength and function.  Baseline: Pt has not yet started her initial HEP (04/07/2024); No questions (05/21/2024) Goal status: MET  LONG TERM GOALS: Target date: 06/05/2024  Pt will have a decrease in neck pain at worst to 3/10 or less to promote ability to look around, perform her job more comfortably.  Baseline: At least 7/10 at worst for the past 3 months (04/07/2024); 5/10 at most for the past 7 days (05/21/2024) Goal status: PROGRESSING  2.  Pt will improve her Neck Disability Index Score by at least 10% as a demonstration of improved function.  Baseline: Neck Disability Index Score 18/50 (36%) (04/07/2024) Goal status: INITIAL  3. Pt will improve R and L cervical rotation AROM in the upright position by at least 10 degrees to promote ability to look around with less difficulty.  Baseline:  Active ROM A/PROM (deg) eval  Right rotation 55 with L posterior lateral neck pain to shoulder (50 in supine)  Left rotation 30 with L posterior lateral neck pain > R posterior lateral neck pain. (53 in supine with L cervical side bend)   Goal status: INITIAL    PLAN:  PT FREQUENCY: 1-2x/week  PT DURATION: 8 weeks  PLANNED INTERVENTIONS: 97110-Therapeutic exercises, 97530- Therapeutic activity, [id number]- Neuromuscular re-education, 97535- Self Care, 02859- Manual therapy, [id number]- Electrical stimulation (unattended), 02987- Traction (mechanical), [id number]- Ionotophoresis 4mg /ml Dexamethasone, Patient/Family education, Joint mobilization, and Spinal mobilization  PLAN FOR NEXT SESSION: F/u on response to HEP. Posture, scapular and anterior cervical strengthening, cervical and scapular mechanics, manual technique, modalities  PRN   Lovina Zuver, PT, DPT Physical Therapist- [facility]  [facility] 05/21/2024, 9:40 AM

## 2024-05-25 ENCOUNTER — Encounter

## 2024-05-26 ENCOUNTER — Ambulatory Visit

## 2024-05-26 DIAGNOSIS — M542 Cervicalgia: Secondary | ICD-10-CM | POA: Diagnosis not present

## 2024-05-26 DIAGNOSIS — G4486 Cervicogenic headache: Secondary | ICD-10-CM | POA: Diagnosis not present

## 2024-05-26 NOTE — Therapy (Signed)
 OUTPATIENT PHYSICAL THERAPY CERVICAL TREATMENT And  Progress Report  (04/07/2024 - 05/26/2024)   Patient Name: Pamela Pamela Francis: [id number] DOB:June 15, 1961, 63 y.o., female Today's Date: 05/26/2024  END OF SESSION:  PT End of Session - 05/26/24 0952     Visit Number 10    Number of Visits 17    Date for PT Re-Evaluation 06/05/24    PT Start Time 0952    PT Stop Time 1031    PT Time Calculation (min) 39 min    Activity Tolerance Patient limited by pain;Patient tolerated treatment well    Behavior During Therapy [facility] for tasks assessed/performed                 Past Medical History:  Diagnosis Date   Allergy    Anxiety    Arthritis    Blood in stool    Chicken pox    Depression    Frequent headaches    Hyperlipidemia    Hypertension    Urine incontinence    Past Surgical History:  Procedure Laterality Date   BREAST BIOPSY Left 02/2005   neg   COLONOSCOPY     Dr. Tamea [place], [place] 2013   LEEP     TONSILLECTOMY AND ADENOIDECTOMY     WRIST SURGERY Left    Patient Active Problem List   Diagnosis Date Noted   Joint pain 04/13/2024   Neck pain 11/11/2023   Daily headache 11/11/2023   Overactive bladder 09/30/2023   Uterovaginal prolapse, incomplete 09/30/2023   Chronic idiopathic constipation 09/30/2023   Incontinence of feces 09/30/2023   Dysplasia of cervix, low grade (CIN 1) 07/01/2023   Vaginal atrophy 07/01/2023   Mixed incontinence urge and stress 07/01/2023   Osteopenia after menopause 07/01/2023   H/O LEEP 07/01/2023   Impaired fasting glucose 03/04/2023   Vitamin D  deficiency 03/04/2023   Other fatigue 03/04/2023   Genetic testing 11/22/2022   Essential hypertension 08/19/2022   Tinnitus of both ears 08/19/2022   High grade squamous intraepithelial lesion (HGSIL), grade 3 CIN, on biopsy of cervix 07/11/2022   History of loop electrical excision procedure (LEEP) 07/11/2022   Chronic bilateral low back pain with bilateral sciatica  12/31/2021   SUI (stress urinary incontinence, female) 12/31/2021   Body mass index 28.0-28.9, adult 12/31/2021   Osteoarthritis 11/06/2018   Mixed hyperlipidemia 12/20/2014   Anxiety and depression 12/20/2014   Muscle tension headache 12/20/2014    PCP: Pamela Pamela Francis   REFERRING PROVIDER: Gayland Lauraine PARAS, NP  REFERRING DIAG: M54.2 (ICD-10-CM) - Neck pain R51.9 (ICD-10-CM) - Daily headache M54.2 (ICD-10-CM) - Cervicalgia  THERAPY DIAG:  Cervicalgia  Cervicogenic headache  Rationale for Evaluation and Treatment: Rehabilitation  ONSET DATE: 04/01/2024 (Date PT referral signed. Chronic condition)  SUBJECTIVE:  SUBJECTIVE STATEMENT: R neck pain currently, 6/10 currently. Has bee able to do her HEP which helps sometimes, helps a little bit.     Hand dominance: Left  PERTINENT HISTORY:  Cervicalgia, headaches. Gradual onset, for a while. Pamela Francis also states feeling B UE paresthesias to her 5th digits (along the ulnar nerve/C8/T1 dermatome). Headaches located suboccipital area and posterior neck as well as the sinus areas.    No latex allergies.         PAIN:  Are you having pain? Yes: NPRS scale: /10 currently neck pain and headache.  Pain location: posterior neck, suboccipital area and B upper trap area Pain description: tight, sore Aggravating factors: cervical rotation R and L, cervical flexion and extension.  Relieving factors: heat  PRECAUTIONS: OSTEOPENIA  RED FLAGS: Bowel or bladder incontinence: Yes: bladder (doctor aware) and Cauda equina syndrome: No     WEIGHT BEARING RESTRICTIONS: No  FALLS:  Has patient fallen in last 6 months? No  LIVING ENVIRONMENT: Lives with: lives with their spouse Lives in: House/apartment Stairs: Yes: External: 3 steps;  on left going up Has following equipment at home: None  OCCUPATION: Hair Dresser at [employer]  PLOF: Independent  PATIENT GOALS: move her neck without it hurting and get rid of the headaches.   NEXT MD VISIT: January 2026  OBJECTIVE:  Note: Objective measures were completed at Evaluation unless otherwise noted.  DIAGNOSTIC FINDINGS:  MR CERVICAL SPINE WO CONTRAST 11/19/2023  Narrative & Impression  [facility]   NEUROIMAGING REPORT     STUDY DATE: 11/19/23 PATIENT NAME: Pamela Pamela Francis DOB: 1961-04-07 Pamela Francis: [id number]   ORDERING CLINICIAN: Onita Duos, MD  CLINICAL HISTORY: 63 y.o. year old female with: 1. Neck pain   2. Daily headache   3. Cervicalgia       EXAM: MR CERVICAL SPINE WO CONTRAST  TECHNIQUE: MRI of the cervical spine was obtained utilizing multiplanar, multiecho pulse sequences. CONTRAST:  Diagnostic Product Medications (last 72 hours)       None         COMPARISON: none   IMAGING SITE: [facility] [address]     [address] [place] [address] [phone]       FINDINGS:    On sagittal views the vertebral bodies have normal height and alignment.  The spinal cord is normal in size and appearance. The posterior fossa, pituitary gland and paraspinal soft tissues are unremarkable.     On axial views: C2-3 disc bulging with no spinal stenosis or foraminal narrowing C3-4 disc bulging facet hypertrophy with severe bilateral foraminal stenosis C4-5 this bulging and facet artery with moderate right and mild left foraminal stenosis C5-6 disc bulging and facet hypertrophy with severe right and moderate left foraminal stenosis C6-7 disc bulging and facet hypertrophy with severe left foraminal stenosis C7-T1 disc bulging and facet hypertrophy with moderate bilateral foraminal stenosis   Limited views of the soft tissues of the head and neck are unremarkable.      IMPRESSION:    MRI cervical spine without contrast demonstrating: -Multilevel disc bulging and facet hypertrophy with moderate to severe foraminal stenosis from C3-4 down to C7-T1 as above. -No spinal stenosis or cord signal abnormality.       INTERPRETING PHYSICIAN:  EDUARD FABIENE HANLON, MD Certified in Neurology, Neurophysiology and Neuroimaging   [facility] [address] [place], [place] [address] [phone]      MR BRAIN WO CONTRAST 11/19/2023  Narrative & Impression  [facility]   NEUROIMAGING REPORT     STUDY DATE: 11/19/23 PATIENT NAME: Lyndsey Demos DOB: Sep 20, 1960 Pamela Francis: [id number]   ORDERING CLINICIAN: Onita Duos, MD  CLINICAL HISTORY: 63 y.o. year old female with: 1. Neck pain   2. Daily headache   3. Cervicalgia       EXAM: MR BRAIN WO CONTRAST  TECHNIQUE: MRI of the brain without contrast was obtained utilizing multiplanar, multiecho pulse sequences. CONTRAST:  Diagnostic Product Medications (last 72 hours)       None         COMPARISON: none   IMAGING SITE: [facility] [address]     [address] [place] [address] [phone]       FINDINGS:    No abnormal lesions are seen on diffusion-weighted views to suggest acute ischemia. The cortical sulci, fissures and cisterns are normal in size and appearance. Lateral, third and fourth ventricle are normal in size and appearance. No extra-axial fluid collections are seen. No evidence of mass effect or midline shift.  Minimal punctate periventricular subcortical foci of nonspecific T2 hyperintensities.       On sagittal views the posterior fossa, pituitary gland and corpus callosum are unremarkable. No evidence of intracranial hemorrhage on gradient-echo views. The orbits and their contents, paranasal sinuses and calvarium are unremarkable.  Intracranial flow voids are present.      IMPRESSION:    Unremarkable MRI brain (without). No acute findings.          INTERPRETING PHYSICIAN:  EDUARD FABIENE HANLON, MD Certified in Neurology, Neurophysiology and Neuroimaging   [facility] [address] [place], [place] [address] [phone]        PATIENT SURVEYS:  NDI:  NECK DISABILITY INDEX  Date: 04/07/2024 Score  Pain intensity 3 = The pain is fairly severe at the moment  2. Personal care (washing, dressing, etc.) 0 = I can look after myself normally without causing extra pain  3. Lifting 1 =  I can lift heavy weights but it gives extra pain  4. Reading 2 =  I can read as much as I want with moderate pain in my neck  5. Headaches 4 = I have severe headaches, which come frequently   6. Concentration 1 =  I can concentrate fully when I want to with slight difficulty   7. Work 2 = I can do most of my usual work, but no more  8. Driving 1 =  I can drive my car as long as I want with slight pain in my neck  9. Sleeping 2 = My sleep is mildly disturbed (1-2 hrs sleepless)  10. Recreation 2 = I am able to engage in most, but not all of my usual recreation activities because of   pain in my neck  Total 18/50 (36%)   Minimum Detectable Change (90% confidence): 5 points or 10% points  NECK DISABILITY INDEX  Date: 05/26/2024 Score  Pain intensity 2 = The pain is moderate at the moment  2. Personal care (washing, dressing, etc.) 0 = I can look after myself normally without causing extra pain  3. Lifting 1 =  I can lift heavy weights but it gives extra pain  4. Reading 2 =  I can read as much as I want with moderate pain in my neck  5. Headaches 3 = I have moderate headaches, which come frequently  6. Concentration 2 =  I have a fair degree of difficulty in concentrating when I want to  7. Work 1 =  I can only do my usual work, but no more  8. Driving 2 =  I can drive my car as long as I want with moderate pain in my neck  9. Sleeping 2 = My  sleep is mildly disturbed (1-2 hrs sleepless)  10. Recreation 2 = I am able to engage in most, but not all of my usual recreation activities because of   pain in my neck  Total 17/50 (34%)   Minimum Detectable Change (90% confidence): 5 points or 10% points   COGNITION: Overall cognitive status: Within functional limits for tasks assessed  SENSATION:   POSTURE: forward neck, B protracted shoulders, slight L trunk lateral shift, slight upper thoracic kyphosis, R shoulder lower, movement crease around C5/6 and C6/7 areas  PALPATION: TTP suboccipital area and upper cervical spine. Muscle tension suboccipital area and B cervical paraspinal, and B upper trap area.    CERVICAL ROM:   Active ROM A/PROM (deg) eval  Flexion WFL with posterior cervical pulling  Extension Limited with B scalene and sternocleidomastoid muscle area pain.   Right lateral flexion WFL with R lateral neck pain  Left lateral flexion WFL with L lateral neck pain  Right rotation 55 with L posterior lateral neck pain to shoulder (50 in supine)  Left rotation 30 with L posterior lateral neck pain > R posterior lateral neck pain. (53 in supine with L cervical side bend)   (Blank rows = not tested)  UPPER EXTREMITY ROM:  Active ROM Right eval Left eval  Shoulder flexion    Shoulder extension    Shoulder abduction    Shoulder adduction    Shoulder extension    Shoulder internal rotation    Shoulder external rotation    Elbow flexion    Elbow extension    Wrist flexion    Wrist extension    Wrist ulnar deviation    Wrist radial deviation    Wrist pronation    Wrist supination     (Blank rows = not tested)  UPPER EXTREMITY MMT:  MMT Right eval Left eval  Shoulder flexion 4 4-  Shoulder extension 4 4  Shoulder abduction    Shoulder adduction    Shoulder extension    Shoulder internal rotation    Shoulder external rotation    Middle trapezius    Lower trapezius    Elbow flexion 4- 4-  Elbow  extension 4 4-  Wrist flexion    Wrist extension 4 4-  Wrist ulnar deviation    Wrist radial deviation    Wrist pronation    Wrist supination    Grip strength     (Blank rows = not tested)  CERVICAL SPECIAL TESTS:    FUNCTIONAL TESTS:    TREATMENT DATE: 05/26/2024  Neuromuscular re education Performed with the intent on improving posture an decreasing stress to her neck.   At [facility] machine Seated lat pull downs to promote scapular retraction and lower trap strengthening   Plate 20 for 89k  Plate 25 for 5x3   Decreased R posterior lateral neck pain  Seated B scapular rows plate 25 for 89k  Then plate 20 for 89k  Seated scapular depression isometrics, forearm at chair arm rest  R 10x5 seconds for 3 sets   Decreased neck pain   Seated chin tuck 10x5 seconds for 3 sets  Decreased R cervical paraspinal muscle tension   Seated B Lat pull down green band 10x5 seconds, then 10x   Improved exercise technique, movement at target joints, use of target muscles after mod verbal, visual, tactile cues.      PATIENT EDUCATION:  Education details: POC Person educated: Patient Education method: Explanation Education comprehension: verbalized understanding  HOME EXERCISE PROGRAM: Access Code: [email or web address] URL: [email or web address] Date: 04/17/2024 Prepared by:   Exercises - Supine Chin Tuck  - 1 x daily - 4-5 x weekly - 2 sets - 8 reps - Seated Upper Trapezius Stretch  - 1 x daily - 7 x weekly - 1 sets - 3 reps - 30 hold - Scapular Retraction with Resistance  - 1 x daily - 7 x weekly - 2 sets - 12 reps  - Seated Thoracic Lumbar Extension  - 1 x daily - 7 x weekly - 3 sets - 10 reps - 5 seconds hold  - Seated Cervical Retraction  - 5 x daily - 7 x weekly - 3 sets - 10 reps - 5 seconds hold  Seated scapular depression isometrics,  forearm at chair arm rest  R 10x5 seconds for 3 sets   Decreased neck pain    ASSESSMENT:  CLINICAL IMPRESSION: Pt demonstrates decreased L sided neck pain, as well as improved R and L cervical rotation AROM since initial evaluation. Pain is currently in her R posterior lateral neck.  Worked on improving lower trap strengthening, thoracic extension, and anterior cervical muscle activation to help decrease stress to her posterior neck. Pt tolerated session well without aggravation of symtoms.  Pt will benefit from skilled physical therapy services to address the aforementioned deficits.      OBJECTIVE IMPAIRMENTS: decreased ROM, decreased strength, improper body mechanics, postural dysfunction, and pain.   ACTIVITY LIMITATIONS: looking around  PARTICIPATION LIMITATIONS:   PERSONAL FACTORS: Fitness, Past/current experiences, Profession, Time since onset of injury/illness/exacerbation, and 3+ comorbidities: anxiety, depression, HTN are also affecting patient's functional outcome.   REHAB POTENTIAL: Fair    CLINICAL DECISION MAKING: Stable/uncomplicated  EVALUATION COMPLEXITY: Low   GOALS: Goals reviewed with patient? Yes  SHORT TERM GOALS: Target date: 04/17/2024  Pt will be independent with her initial HEP to decrease pain, improve cervical posture, UE strength and function.  Baseline: Pt has not yet started her initial HEP (04/07/2024); No questions (05/21/2024) Goal status: MET  LONG TERM GOALS: Target date: 06/05/2024  Pt will have a decrease in neck pain at worst to 3/10 or less to promote ability to look around, perform her job more comfortably.  Baseline: At least 7/10 at worst for the past 3 months (04/07/2024); 5/10 at most for the past 7 days (05/21/2024); 6/10 R neck pain at most for the past 7 days (05/26/2024) Goal status: PROGRESSING  2.  Pt will improve her Neck Disability Index Score by at least 10% as a demonstration of improved function.  Baseline: Neck Disability  Index Score 18/50 (36%) (04/07/2024); 17/50 (34%) (05/26/2024) Goal status: ONGOING  3. Pt will improve R and L cervical rotation AROM in the upright position by at least 10 degrees to promote ability to look around with less difficulty.  Baseline:  Active ROM A/PROM (deg) eval AROM (05/26/2024)  Right rotation 55 with L posterior lateral neck pain to shoulder (50 in supine) 62 with R posterior lateral neck pain  Left rotation 30 with L posterior lateral neck pain > R posterior lateral neck pain. (53 in supine with L cervical side bend) 50 with R posterior lateral neck pain   Goal status: PARTIALLY MET    PLAN:  PT FREQUENCY: 1-2x/week  PT DURATION: 8 weeks  PLANNED INTERVENTIONS: 97110-Therapeutic exercises, 97530- Therapeutic activity, [id number]- Neuromuscular re-education, 97535- Self Care, 02859- Manual therapy, [id number]- Electrical stimulation (unattended), 02987- Traction (mechanical), 97033- Ionotophoresis 4mg /ml Dexamethasone, Patient/Family education, Joint mobilization, and Spinal mobilization  PLAN FOR NEXT SESSION: F/u on response to HEP. Posture, scapular and anterior cervical strengthening, cervical and scapular mechanics, manual technique, modalities PRN  Thank you for your referral.  Emil Glassman, PT, DPT Physical Therapist- [facility] 05/26/2024, 2:39 PM

## 2024-05-28 ENCOUNTER — Ambulatory Visit

## 2024-05-28 DIAGNOSIS — G4486 Cervicogenic headache: Secondary | ICD-10-CM

## 2024-05-28 DIAGNOSIS — M542 Cervicalgia: Secondary | ICD-10-CM | POA: Diagnosis not present

## 2024-05-28 NOTE — Therapy (Signed)
 OUTPATIENT PHYSICAL THERAPY CERVICAL TREATMENT   Patient Name: Pamela Francis MRN: [id number] DOB:[birth year], 63 y.o., female Today's Date: 05/28/2024  END OF SESSION:  PT End of Session - 05/28/24 0735     Visit Number 11    Number of Visits 17    Date for PT Re-Evaluation 06/05/24    PT Start Time 0735    PT Stop Time 0820    PT Time Calculation (min) 45 min    Activity Tolerance Patient limited by pain;Patient tolerated treatment well    Behavior During Therapy [facility] for tasks assessed/performed                  Past Medical History:  Diagnosis Date   Allergy    Anxiety    Arthritis    Blood in stool    Chicken pox    Depression    Frequent headaches    Hyperlipidemia    Hypertension    Urine incontinence    Past Surgical History:  Procedure Laterality Date   BREAST BIOPSY Left 02/2005   neg   COLONOSCOPY     Dr. Tamea [place], [place] 2013   LEEP     TONSILLECTOMY AND ADENOIDECTOMY     WRIST SURGERY Left    Patient Active Problem List   Diagnosis Date Noted   Joint pain 04/13/2024   Neck pain 11/11/2023   Daily headache 11/11/2023   Overactive bladder 09/30/2023   Uterovaginal prolapse, incomplete 09/30/2023   Chronic idiopathic constipation 09/30/2023   Incontinence of feces 09/30/2023   Dysplasia of cervix, low grade (CIN 1) 07/01/2023   Vaginal atrophy 07/01/2023   Mixed incontinence urge and stress 07/01/2023   Osteopenia after menopause 07/01/2023   H/O LEEP 07/01/2023   Impaired fasting glucose 03/04/2023   Vitamin D  deficiency 03/04/2023   Other fatigue 03/04/2023   Genetic testing 11/22/2022   Essential hypertension 08/19/2022   Tinnitus of both ears 08/19/2022   High grade squamous intraepithelial lesion (HGSIL), grade 3 CIN, on biopsy of cervix 07/11/2022   History of loop electrical excision procedure (LEEP) 07/11/2022   Chronic bilateral low back pain with bilateral sciatica 12/31/2021   SUI (stress urinary incontinence,  female) 12/31/2021   Body mass index 28.0-28.9, adult 12/31/2021   Osteoarthritis 11/06/2018   Mixed hyperlipidemia 12/20/2014   Anxiety and depression 12/20/2014   Muscle tension headache 12/20/2014    PCP: Gayle Saddie [name]   REFERRING PROVIDER: Gayland Lauraine PARAS, NP  REFERRING DIAG: M54.2 (ICD-10-CM) - Neck pain R51.9 (ICD-10-CM) - Daily headache M54.2 (ICD-10-CM) - Cervicalgia  THERAPY DIAG:  Cervicalgia  Cervicogenic headache  Rationale for Evaluation and Treatment: Rehabilitation  ONSET DATE: 04/01/2024 (Date PT referral signed. Chronic condition)  SUBJECTIVE:  SUBJECTIVE STATEMENT: Still has R neck pain, 5-6/10 currently. Also has a posterior headache. R neck was a little sore at work after last session. Wakes up on her L side to stomach position.       Hand dominance: Left  PERTINENT HISTORY:  Cervicalgia, headaches. Gradual onset, for a while. [name] also states feeling B UE paresthesias to her 5th digits (along the ulnar nerve/C8/T1 dermatome). Headaches located suboccipital area and posterior neck as well as the sinus areas.    No latex allergies.         PAIN:  Are you having pain? Yes: NPRS scale: /10 currently neck pain and headache.  Pain location: posterior neck, suboccipital area and B upper trap area Pain description: tight, sore Aggravating factors: cervical rotation R and L, cervical flexion and extension.  Relieving factors: heat  PRECAUTIONS: OSTEOPENIA  RED FLAGS: Bowel or bladder incontinence: Yes: bladder (doctor aware) and Cauda equina syndrome: No     WEIGHT BEARING RESTRICTIONS: No  FALLS:  Has patient fallen in last 6 months? No  LIVING ENVIRONMENT: Lives with: lives with their spouse Lives in: House/apartment Stairs: Yes:  External: 3 steps; on left going up Has following equipment at home: None  OCCUPATION: Hair Dresser at [employer]  PLOF: Independent  PATIENT GOALS: move her neck without it hurting and get rid of the headaches.   NEXT MD VISIT: January 2026  OBJECTIVE:  Note: Objective measures were completed at Evaluation unless otherwise noted.  DIAGNOSTIC FINDINGS:  MR CERVICAL SPINE WO CONTRAST 11/19/2023  Narrative & Impression  [facility]   NEUROIMAGING REPORT     STUDY DATE: 11/19/23 PATIENT NAME: Pamela Francis DOB: 1961-06-29 MRN: [id number]   ORDERING CLINICIAN: Onita Duos, MD  CLINICAL HISTORY: 63 y.o. year old female with: 1. Neck pain   2. Daily headache   3. Cervicalgia       EXAM: MR CERVICAL SPINE WO CONTRAST  TECHNIQUE: MRI of the cervical spine was obtained utilizing multiplanar, multiecho pulse sequences. CONTRAST:  Diagnostic Product Medications (last 72 hours)       None         COMPARISON: none   IMAGING SITE: [facility] [address]     [address] [place] [address] [phone]       FINDINGS:    On sagittal views the vertebral bodies have normal height and alignment.  The spinal cord is normal in size and appearance. The posterior fossa, pituitary gland and paraspinal soft tissues are unremarkable.     On axial views: C2-3 disc bulging with no spinal stenosis or foraminal narrowing C3-4 disc bulging facet hypertrophy with severe bilateral foraminal stenosis C4-5 this bulging and facet artery with moderate right and mild left foraminal stenosis C5-6 disc bulging and facet hypertrophy with severe right and moderate left foraminal stenosis C6-7 disc bulging and facet hypertrophy with severe left foraminal stenosis C7-T1 disc bulging and facet hypertrophy with moderate bilateral foraminal stenosis   Limited views of the soft tissues of the head and neck are  unremarkable.     IMPRESSION:    MRI cervical spine without contrast demonstrating: -Multilevel disc bulging and facet hypertrophy with moderate to severe foraminal stenosis from C3-4 down to C7-T1 as above. -No spinal stenosis or cord signal abnormality.       INTERPRETING PHYSICIAN:  EDUARD FABIENE HANLON, MD Certified in Neurology, Neurophysiology and Neuroimaging   [facility] [address] [place],  [place] [address] [phone]      MR BRAIN WO CONTRAST 11/19/2023  Narrative & Impression  [facility]   NEUROIMAGING REPORT     STUDY DATE: 11/19/23 PATIENT NAME: Pamela Francis DOB: 24-May-1961 MRN: [id number]   ORDERING CLINICIAN: Onita Duos, MD  CLINICAL HISTORY: 63 y.o. year old female with: 1. Neck pain   2. Daily headache   3. Cervicalgia       EXAM: MR BRAIN WO CONTRAST  TECHNIQUE: MRI of the brain without contrast was obtained utilizing multiplanar, multiecho pulse sequences. CONTRAST:  Diagnostic Product Medications (last 72 hours)       None         COMPARISON: none   IMAGING SITE: [facility] [address]     [address] [place] [address] [phone]       FINDINGS:    No abnormal lesions are seen on diffusion-weighted views to suggest acute ischemia. The cortical sulci, fissures and cisterns are normal in size and appearance. Lateral, third and fourth ventricle are normal in size and appearance. No extra-axial fluid collections are seen. No evidence of mass effect or midline shift.  Minimal punctate periventricular subcortical foci of nonspecific T2 hyperintensities.       On sagittal views the posterior fossa, pituitary gland and corpus callosum are unremarkable. No evidence of intracranial hemorrhage on gradient-echo views. The orbits and their contents, paranasal sinuses and calvarium are unremarkable.  Intracranial flow voids  are present.     IMPRESSION:    Unremarkable MRI brain (without). No acute findings.          INTERPRETING PHYSICIAN:  EDUARD FABIENE HANLON, MD Certified in Neurology, Neurophysiology and Neuroimaging   [facility] [address], [address] [place], [place] [address] [phone]        PATIENT SURVEYS:  NDI:  NECK DISABILITY INDEX  Date: 04/07/2024 Score  Pain intensity 3 = The pain is fairly severe at the moment  2. Personal care (washing, dressing, etc.) 0 = I can look after myself normally without causing extra pain  3. Lifting 1 =  I can lift heavy weights but it gives extra pain  4. Reading 2 =  I can read as much as I want with moderate pain in my neck  5. Headaches 4 = I have severe headaches, which come frequently   6. Concentration 1 =  I can concentrate fully when I want to with slight difficulty   7. Work 2 = I can do most of my usual work, but no more  8. Driving 1 =  I can drive my car as long as I want with slight pain in my neck  9. Sleeping 2 = My sleep is mildly disturbed (1-2 hrs sleepless)  10. Recreation 2 = I am able to engage in most, but not all of my usual recreation activities because of   pain in my neck  Total 18/50 (36%)   Minimum Detectable Change (90% confidence): 5 points or 10% points  NECK DISABILITY INDEX  Date: 05/26/2024 Score  Pain intensity 2 = The pain is moderate at the moment  2. Personal care (washing, dressing, etc.) 0 = I can look after myself normally without causing extra pain  3. Lifting 1 =  I can lift heavy weights but it gives extra pain  4. Reading 2 =  I can read as much as I want with moderate pain in my neck  5. Headaches 3 = I have moderate headaches, which come frequently  6. Concentration 2 = I have a fair degree of difficulty in concentrating when I want to  7. Work 1 =  I can only do my usual work, but no more  8. Driving 2 =  I can drive my car as long as I want with moderate pain in my neck  9.  Sleeping 2 = My sleep is mildly disturbed (1-2 hrs sleepless)  10. Recreation 2 = I am able to engage in most, but not all of my usual recreation activities because of   pain in my neck  Total 17/50 (34%)   Minimum Detectable Change (90% confidence): 5 points or 10% points   COGNITION: Overall cognitive status: Within functional limits for tasks assessed  SENSATION:   POSTURE: forward neck, B protracted shoulders, slight L trunk lateral shift, slight upper thoracic kyphosis, R shoulder lower, movement crease around C5/6 and C6/7 areas  PALPATION: TTP suboccipital area and upper cervical spine. Muscle tension suboccipital area and B cervical paraspinal, and B upper trap area.    CERVICAL ROM:   Active ROM A/PROM (deg) eval  Flexion WFL with posterior cervical pulling  Extension Limited with B scalene and sternocleidomastoid muscle area pain.   Right lateral flexion WFL with R lateral neck pain  Left lateral flexion WFL with L lateral neck pain  Right rotation 55 with L posterior lateral neck pain to shoulder (50 in supine)  Left rotation 30 with L posterior lateral neck pain > R posterior lateral neck pain. (53 in supine with L cervical side bend)   (Blank rows = not tested)  UPPER EXTREMITY ROM:  Active ROM Right eval Left eval  Shoulder flexion    Shoulder extension    Shoulder abduction    Shoulder adduction    Shoulder extension    Shoulder internal rotation    Shoulder external rotation    Elbow flexion    Elbow extension    Wrist flexion    Wrist extension    Wrist ulnar deviation    Wrist radial deviation    Wrist pronation    Wrist supination     (Blank rows = not tested)  UPPER EXTREMITY MMT:  MMT Right eval Left eval  Shoulder flexion 4 4-  Shoulder extension 4 4  Shoulder abduction    Shoulder adduction    Shoulder extension    Shoulder internal rotation    Shoulder external rotation    Middle trapezius    Lower trapezius    Elbow flexion 4-  4-  Elbow extension 4 4-  Wrist flexion    Wrist extension 4 4-  Wrist ulnar deviation    Wrist radial deviation    Wrist pronation    Wrist supination    Grip strength     (Blank rows = not tested)  CERVICAL SPECIAL TESTS:    FUNCTIONAL TESTS:    TREATMENT DATE: 05/28/2024  Manual therapy  Supine STM cervical paraspinal muscles R > L Supine R UPA to C3,C4 TP grade 3- to decrease stiffness,  Supine L cervical rotation with sustained R UPA to C3 TP 10x R to L transverse glide grade 3- to C4   Unsure if better symptoms afterwards.    Supine manual cervical traction  Felt good during traction per pt    Neuromuscular re education Performed with the intent on improving posture an decreasing stress to her neck.   Supine cervical nod 10x10 seconds for 2 sets  At [facility] machine Seated lat pull downs to promote scapular retraction and lower trap strengthening   Plate 20 for 89k6   Seated B scapular rows plate 20 for 89k6   Standing chin tucks at wall 10x3 with 5 second holds   Improved exercise technique, movement at target joints, use of target muscles after mod verbal, visual, tactile cues.     Decreased neck pain to 4/10 after session.    PATIENT EDUCATION:  Education details: POC Person educated: Patient Education method: Explanation Education comprehension: verbalized understanding  HOME EXERCISE PROGRAM: Access Code: [email or web address] URL: [email or web address] Date: 04/17/2024 Prepared by:   Exercises - Supine Chin Tuck  - 1 x daily - 4-5 x weekly - 2 sets - 8 reps - Seated Upper Trapezius Stretch  - 1 x daily - 7 x weekly - 1 sets - 3 reps - 30 hold - Scapular Retraction with Resistance  - 1 x daily - 7 x weekly - 2 sets - 12 reps  - Seated Thoracic Lumbar Extension  - 1 x daily - 7 x weekly - 3 sets - 10 reps - 5 seconds  hold  - Seated Cervical Retraction  - 5 x daily - 7 x weekly - 3 sets - 10 reps - 5 seconds hold  Seated scapular depression isometrics, forearm at chair arm rest  R 10x5 seconds for 3 sets   Decreased neck pain    ASSESSMENT:  CLINICAL IMPRESSION:  Worked on soft tissue techniques to decrease cervical paraspinal muscle tension and improve cervical mobility to decrease stress to affected areas. Also worked on scapular strengthening and thoracic extension to decrease stress to mid to lower cervical spine. Decreased neck pain reported after session. Pt will benefit from continued skilled physical therapy services to decrease pain, improve strength and function.       OBJECTIVE IMPAIRMENTS: decreased ROM, decreased strength, improper body mechanics, postural dysfunction, and pain.   ACTIVITY LIMITATIONS: looking around  PARTICIPATION LIMITATIONS:   PERSONAL FACTORS: Fitness, Past/current experiences, Profession, Time since onset of injury/illness/exacerbation, and 3+ comorbidities: anxiety, depression, HTN are also affecting patient's functional outcome.   REHAB POTENTIAL: Fair    CLINICAL DECISION MAKING: Stable/uncomplicated  EVALUATION COMPLEXITY: Low   GOALS: Goals reviewed with patient? Yes  SHORT TERM GOALS: Target date: 04/17/2024  Pt will be independent with her initial HEP to decrease pain, improve cervical posture, UE strength and function.  Baseline: Pt has not yet started her initial HEP (04/07/2024); No questions (05/21/2024) Goal status: MET  LONG TERM GOALS: Target date: 06/05/2024  Pt will have a decrease in neck pain at worst to 3/10 or less to promote ability to look around, perform her job more comfortably.  Baseline: At least 7/10 at worst for the past 3 months (04/07/2024); 5/10 at most for the past 7 days (05/21/2024); 6/10 R neck pain at most for the past 7 days (05/26/2024) Goal status: PROGRESSING  2.  Pt will  improve her Neck Disability Index Score by at  least 10% as a demonstration of improved function.  Baseline: Neck Disability Index Score 18/50 (36%) (04/07/2024); 17/50 (34%) (05/26/2024) Goal status: ONGOING  3. Pt will improve R and L cervical rotation AROM in the upright position by at least 10 degrees to promote ability to look around with less difficulty.  Baseline:  Active ROM A/PROM (deg) eval AROM (05/26/2024)  Right rotation 55 with L posterior lateral neck pain to shoulder (50 in supine) 62 with R posterior lateral neck pain  Left rotation 30 with L posterior lateral neck pain > R posterior lateral neck pain. (53 in supine with L cervical side bend) 50 with R posterior lateral neck pain   Goal status: PARTIALLY MET    PLAN:  PT FREQUENCY: 1-2x/week  PT DURATION: 8 weeks  PLANNED INTERVENTIONS: 97110-Therapeutic exercises, 97530- Therapeutic activity, [id number]- Neuromuscular re-education, 97535- Self Care, 02859- Manual therapy, [id number]- Electrical stimulation (unattended), 02987- Traction (mechanical), [id number]- Ionotophoresis 4mg /ml Dexamethasone, Patient/Family education, Joint mobilization, and Spinal mobilization  PLAN FOR NEXT SESSION: F/u on response to HEP. Posture, scapular and anterior cervical strengthening, cervical and scapular mechanics, manual technique, modalities PRN   Sallye Lunz, PT, DPT Physical Therapist- [facility]  [facility] 05/28/2024, 8:44 AM

## 2024-06-02 ENCOUNTER — Ambulatory Visit

## 2024-06-02 DIAGNOSIS — M542 Cervicalgia: Secondary | ICD-10-CM

## 2024-06-02 DIAGNOSIS — G4486 Cervicogenic headache: Secondary | ICD-10-CM

## 2024-06-02 NOTE — Therapy (Signed)
 OUTPATIENT PHYSICAL THERAPY CERVICAL TREATMENT   Patient Name: Pamela Francis MRN: [id number] DOB:07-Apr-1961, 63 y.o., female Today's Date: 06/02/2024  END OF SESSION:  PT End of Session - 06/02/24 0902     Visit Number 12    Number of Visits 17    Date for PT Re-Evaluation 06/05/24    PT Start Time 0902    PT Stop Time 0943    PT Time Calculation (min) 41 min    Activity Tolerance Patient limited by pain;Patient tolerated treatment well    Behavior During Therapy [facility] for tasks assessed/performed                   Past Medical History:  Diagnosis Date   Allergy    Anxiety    Arthritis    Blood in stool    Chicken pox    Depression    Frequent headaches    Hyperlipidemia    Hypertension    Urine incontinence    Past Surgical History:  Procedure Laterality Date   BREAST BIOPSY Left 02/2005   neg   COLONOSCOPY     Dr. Tamea [place], [place] 2013   LEEP     TONSILLECTOMY AND ADENOIDECTOMY     WRIST SURGERY Left    Patient Active Problem List   Diagnosis Date Noted   Joint pain 04/13/2024   Neck pain 11/11/2023   Daily headache 11/11/2023   Overactive bladder 09/30/2023   Uterovaginal prolapse, incomplete 09/30/2023   Chronic idiopathic constipation 09/30/2023   Incontinence of feces 09/30/2023   Dysplasia of cervix, low grade (CIN 1) 07/01/2023   Vaginal atrophy 07/01/2023   Mixed incontinence urge and stress 07/01/2023   Osteopenia after menopause 07/01/2023   H/O LEEP 07/01/2023   Impaired fasting glucose 03/04/2023   Vitamin D  deficiency 03/04/2023   Other fatigue 03/04/2023   Genetic testing 11/22/2022   Essential hypertension 08/19/2022   Tinnitus of both ears 08/19/2022   High grade squamous intraepithelial lesion (HGSIL), grade 3 CIN, on biopsy of cervix 07/11/2022   History of loop electrical excision procedure (LEEP) 07/11/2022   Chronic bilateral low back pain with bilateral sciatica 12/31/2021   SUI (stress urinary incontinence,  female) 12/31/2021   Body mass index 28.0-28.9, adult 12/31/2021   Osteoarthritis 11/06/2018   Mixed hyperlipidemia 12/20/2014   Anxiety and depression 12/20/2014   Muscle tension headache 12/20/2014    PCP: Gayle Saddie [name]   REFERRING PROVIDER: Gayland Lauraine PARAS, NP  REFERRING DIAG: M54.2 (ICD-10-CM) - Neck pain R51.9 (ICD-10-CM) - Daily headache M54.2 (ICD-10-CM) - Cervicalgia  THERAPY DIAG:  Cervicalgia  Cervicogenic headache  Rationale for Evaluation and Treatment: Rehabilitation  ONSET DATE: 04/01/2024 (Date PT referral signed. Chronic condition)  SUBJECTIVE:  SUBJECTIVE STATEMENT: Neck is sore, 4-5/10 currently R posterior lateral neck currently. Neck felt better at work that day after last session.       Hand dominance: Left  PERTINENT HISTORY:  Cervicalgia, headaches. Gradual onset, for a while. [name] also states feeling B UE paresthesias to her 5th digits (along the ulnar nerve/C8/T1 dermatome). Headaches located suboccipital area and posterior neck as well as the sinus areas.    No latex allergies.         PAIN:  Are you having pain? Yes: NPRS scale: /10 currently neck pain and headache.  Pain location: posterior neck, suboccipital area and B upper trap area Pain description: tight, sore Aggravating factors: cervical rotation R and L, cervical flexion and extension.  Relieving factors: heat  PRECAUTIONS: OSTEOPENIA  RED FLAGS: Bowel or bladder incontinence: Yes: bladder (doctor aware) and Cauda equina syndrome: No     WEIGHT BEARING RESTRICTIONS: No  FALLS:  Has patient fallen in last 6 months? No  LIVING ENVIRONMENT: Lives with: lives with their spouse Lives in: House/apartment Stairs: Yes: External: 3 steps; on left going up Has following  equipment at home: None  OCCUPATION: Hair Dresser at [employer]  PLOF: Independent  PATIENT GOALS: move her neck without it hurting and get rid of the headaches.   NEXT MD VISIT: January 2026  OBJECTIVE:  Note: Objective measures were completed at Evaluation unless otherwise noted.  DIAGNOSTIC FINDINGS:  MR CERVICAL SPINE WO CONTRAST 11/19/2023  Narrative & Impression  [facility]   NEUROIMAGING REPORT     STUDY DATE: 11/19/23 PATIENT NAME: Pamela Francis DOB: July 23, 1961 MRN: [id number]   ORDERING CLINICIAN: Onita Duos, MD  CLINICAL HISTORY: 63 y.o. year old female with: 1. Neck pain   2. Daily headache   3. Cervicalgia       EXAM: MR CERVICAL SPINE WO CONTRAST  TECHNIQUE: MRI of the cervical spine was obtained utilizing multiplanar, multiecho pulse sequences. CONTRAST:  Diagnostic Product Medications (last 72 hours)       None         COMPARISON: none   IMAGING SITE: [facility] [address]     [address] [place] [address] [phone]       FINDINGS:    On sagittal views the vertebral bodies have normal height and alignment.  The spinal cord is normal in size and appearance. The posterior fossa, pituitary gland and paraspinal soft tissues are unremarkable.     On axial views: C2-3 disc bulging with no spinal stenosis or foraminal narrowing C3-4 disc bulging facet hypertrophy with severe bilateral foraminal stenosis C4-5 this bulging and facet artery with moderate right and mild left foraminal stenosis C5-6 disc bulging and facet hypertrophy with severe right and moderate left foraminal stenosis C6-7 disc bulging and facet hypertrophy with severe left foraminal stenosis C7-T1 disc bulging and facet hypertrophy with moderate bilateral foraminal stenosis   Limited views of the soft tissues of the head and neck are unremarkable.     IMPRESSION:    MRI cervical  spine without contrast demonstrating: -Multilevel disc bulging and facet hypertrophy with moderate to severe foraminal stenosis from C3-4 down to C7-T1 as above. -No spinal stenosis or cord signal abnormality.       INTERPRETING PHYSICIAN:  EDUARD FABIENE HANLON, MD Certified in Neurology, Neurophysiology and Neuroimaging   [facility] [address], [address] [place], [place] [address] [phone]      MR BRAIN WO  CONTRAST 11/19/2023  Narrative & Impression  [facility]   NEUROIMAGING REPORT     STUDY DATE: 11/19/23 PATIENT NAME: Pamela Francis DOB: May 31, 1961 MRN: [id number]   ORDERING CLINICIAN: Onita Duos, MD  CLINICAL HISTORY: 63 y.o. year old female with: 1. Neck pain   2. Daily headache   3. Cervicalgia       EXAM: MR BRAIN WO CONTRAST  TECHNIQUE: MRI of the brain without contrast was obtained utilizing multiplanar, multiecho pulse sequences. CONTRAST:  Diagnostic Product Medications (last 72 hours)       None         COMPARISON: none   IMAGING SITE: [facility] [address]     [address] [place] [address] [phone]       FINDINGS:    No abnormal lesions are seen on diffusion-weighted views to suggest acute ischemia. The cortical sulci, fissures and cisterns are normal in size and appearance. Lateral, third and fourth ventricle are normal in size and appearance. No extra-axial fluid collections are seen. No evidence of mass effect or midline shift.  Minimal punctate periventricular subcortical foci of nonspecific T2 hyperintensities.       On sagittal views the posterior fossa, pituitary gland and corpus callosum are unremarkable. No evidence of intracranial hemorrhage on gradient-echo views. The orbits and their contents, paranasal sinuses and calvarium are unremarkable.  Intracranial flow voids are present.     IMPRESSION:    Unremarkable  MRI brain (without). No acute findings.          INTERPRETING PHYSICIAN:  EDUARD FABIENE HANLON, MD Certified in Neurology, Neurophysiology and Neuroimaging   [facility] [address] [place], [place] [address] [phone]        PATIENT SURVEYS:  NDI:  NECK DISABILITY INDEX  Date: 04/07/2024 Score  Pain intensity 3 = The pain is fairly severe at the moment  2. Personal care (washing, dressing, etc.) 0 = I can look after myself normally without causing extra pain  3. Lifting 1 =  I can lift heavy weights but it gives extra pain  4. Reading 2 =  I can read as much as I want with moderate pain in my neck  5. Headaches 4 = I have severe headaches, which come frequently   6. Concentration 1 =  I can concentrate fully when I want to with slight difficulty   7. Work 2 = I can do most of my usual work, but no more  8. Driving 1 =  I can drive my car as long as I want with slight pain in my neck  9. Sleeping 2 = My sleep is mildly disturbed (1-2 hrs sleepless)  10. Recreation 2 = I am able to engage in most, but not all of my usual recreation activities because of   pain in my neck  Total 18/50 (36%)   Minimum Detectable Change (90% confidence): 5 points or 10% points  NECK DISABILITY INDEX  Date: 05/26/2024 Score  Pain intensity 2 = The pain is moderate at the moment  2. Personal care (washing, dressing, etc.) 0 = I can look after myself normally without causing extra pain  3. Lifting 1 =  I can lift heavy weights but it gives extra pain  4. Reading 2 =  I can read as much as I want with moderate pain in my neck  5. Headaches 3 = I have moderate headaches, which come frequently  6. Concentration 2 = I have a fair degree of difficulty in concentrating when I want to  7. Work 1 =  I can only do my usual work, but no more  8. Driving 2 =  I can drive my car as long as I want with moderate pain in my neck  9. Sleeping 2 = My sleep is mildly disturbed (1-2  hrs sleepless)  10. Recreation 2 = I am able to engage in most, but not all of my usual recreation activities because of   pain in my neck  Total 17/50 (34%)   Minimum Detectable Change (90% confidence): 5 points or 10% points   COGNITION: Overall cognitive status: Within functional limits for tasks assessed  SENSATION:   POSTURE: forward neck, B protracted shoulders, slight L trunk lateral shift, slight upper thoracic kyphosis, R shoulder lower, movement crease around C5/6 and C6/7 areas  PALPATION: TTP suboccipital area and upper cervical spine. Muscle tension suboccipital area and B cervical paraspinal, and B upper trap area.    CERVICAL ROM:   Active ROM A/PROM (deg) eval  Flexion WFL with posterior cervical pulling  Extension Limited with B scalene and sternocleidomastoid muscle area pain.   Right lateral flexion WFL with R lateral neck pain  Left lateral flexion WFL with L lateral neck pain  Right rotation 55 with L posterior lateral neck pain to shoulder (50 in supine)  Left rotation 30 with L posterior lateral neck pain > R posterior lateral neck pain. (53 in supine with L cervical side bend)   (Blank rows = not tested)  UPPER EXTREMITY ROM:  Active ROM Right eval Left eval  Shoulder flexion    Shoulder extension    Shoulder abduction    Shoulder adduction    Shoulder extension    Shoulder internal rotation    Shoulder external rotation    Elbow flexion    Elbow extension    Wrist flexion    Wrist extension    Wrist ulnar deviation    Wrist radial deviation    Wrist pronation    Wrist supination     (Blank rows = not tested)  UPPER EXTREMITY MMT:  MMT Right eval Left eval  Shoulder flexion 4 4-  Shoulder extension 4 4  Shoulder abduction    Shoulder adduction    Shoulder extension    Shoulder internal rotation    Shoulder external rotation    Middle trapezius    Lower trapezius    Elbow flexion 4- 4-  Elbow extension 4 4-  Wrist flexion     Wrist extension 4 4-  Wrist ulnar deviation    Wrist radial deviation    Wrist pronation    Wrist supination    Grip strength     (Blank rows = not tested)  CERVICAL SPECIAL TESTS:    FUNCTIONAL TESTS:    TREATMENT DATE: 06/02/2024  Manual therapy  Supine STM cervical paraspinal muscles R > L Supine R UPA to C3 TP grade 3- to decrease stiffness,  Supine L cervical rotation with sustained R UPA to C3 TP 10x  Supine manual cervical traction  Felt good during traction per pt    Neuromuscular re education Performed with the intent on improving posture an decreasing stress to her neck.   Supine cervical nod 10x10 seconds for 2 sets  Supine chin tuck with B shoulder abduction with towel roll at thoracic spine to promote extension 10x5 seconds for 2 sets  Decreased neck pain  Seated lat pull down green band 10x5 seconds for 3 sets  Cues for activation of lower trap muscles  Decreased neck pain.   Standing B shoulder extension with scapular retraction 10x     Improved exercise technique, movement at target joints, use of target muscles after mod verbal, visual, tactile cues.      PATIENT EDUCATION:  Education details: POC Person educated: Patient Education method: Explanation Education comprehension: verbalized understanding  HOME EXERCISE PROGRAM: Access Code: [email or web address] URL: [email or web address] Date: 04/17/2024 Prepared by:   Exercises - Supine Chin Tuck  - 1 x daily - 4-5 x weekly - 2 sets - 8 reps - Seated Upper Trapezius Stretch  - 1 x daily - 7 x weekly - 1 sets - 3 reps - 30 hold - Scapular Retraction with Resistance  - 1 x daily - 7 x weekly - 2 sets - 12 reps  - Seated Thoracic Lumbar Extension  - 1 x daily - 7 x weekly - 3 sets - 10 reps - 5 seconds hold  - Seated Cervical Retraction  - 5 x daily - 7 x weekly -  3 sets - 10 reps - 5 seconds hold  Seated scapular depression isometrics, forearm at chair arm rest  R 10x5 seconds for 3 sets   Decreased neck pain    ASSESSMENT:  CLINICAL IMPRESSION:  Continued working on soft tissue techniques to decrease cervical paraspinal muscle tension and improve cervical mobility to decrease stress to affected areas. Also continued working on scapular strengthening and thoracic extension to decrease stress to mid to lower cervical spine. Decreased neck pain reported after treatment. Pt will benefit from continued skilled physical therapy services to decrease pain, improve strength and function.       OBJECTIVE IMPAIRMENTS: decreased ROM, decreased strength, improper body mechanics, postural dysfunction, and pain.   ACTIVITY LIMITATIONS: looking around  PARTICIPATION LIMITATIONS:   PERSONAL FACTORS: Fitness, Past/current experiences, Profession, Time since onset of injury/illness/exacerbation, and 3+ comorbidities: anxiety, depression, HTN are also affecting patient's functional outcome.   REHAB POTENTIAL: Fair    CLINICAL DECISION MAKING: Stable/uncomplicated  EVALUATION COMPLEXITY: Low   GOALS: Goals reviewed with patient? Yes  SHORT TERM GOALS: Target date: 04/17/2024  Pt will be independent with her initial HEP to decrease pain, improve cervical posture, UE strength and function.  Baseline: Pt has not yet started her initial HEP (04/07/2024); No questions (05/21/2024) Goal status: MET  LONG TERM GOALS: Target date: 06/05/2024  Pt will have a decrease in neck pain at worst to 3/10 or less to promote ability to look around, perform her job more comfortably.  Baseline: At least 7/10 at worst for the past 3 months (04/07/2024); 5/10 at most for the past 7 days (05/21/2024); 6/10 R neck pain at most for the past 7 days (05/26/2024) Goal status: PROGRESSING  2.  Pt will improve her Neck Disability Index Score by  at least 10% as a demonstration of improved  function.  Baseline: Neck Disability Index Score 18/50 (36%) (04/07/2024); 17/50 (34%) (05/26/2024) Goal status: ONGOING  3. Pt will improve R and L cervical rotation AROM in the upright position by at least 10 degrees to promote ability to look around with less difficulty.  Baseline:  Active ROM A/PROM (deg) eval AROM (05/26/2024)  Right rotation 55 with L posterior lateral neck pain to shoulder (50 in supine) 62 with R posterior lateral neck pain  Left rotation 30 with L posterior lateral neck pain > R posterior lateral neck pain. (53 in supine with L cervical side bend) 50 with R posterior lateral neck pain   Goal status: PARTIALLY MET    PLAN:  PT FREQUENCY: 1-2x/week  PT DURATION: 8 weeks  PLANNED INTERVENTIONS: 97110-Therapeutic exercises, 97530- Therapeutic activity, [id number]- Neuromuscular re-education, 97535- Self Care, 02859- Manual therapy, G0283- Electrical stimulation (unattended), 02987- Traction (mechanical), 97033- Ionotophoresis 4mg /ml Dexamethasone, Patient/Family education, Joint mobilization, and Spinal mobilization  PLAN FOR NEXT SESSION: F/u on response to HEP. Posture, scapular and anterior cervical strengthening, cervical and scapular mechanics, manual technique, modalities PRN   Madalena Kesecker, PT, DPT Physical Therapist- [facility]  [facility] 06/02/2024, 12:51 PM

## 2024-06-04 ENCOUNTER — Ambulatory Visit

## 2024-06-05 ENCOUNTER — Ambulatory Visit: Admitting: Obstetrics and Gynecology

## 2024-06-09 ENCOUNTER — Ambulatory Visit

## 2024-06-10 ENCOUNTER — Ambulatory Visit

## 2024-06-12 ENCOUNTER — Encounter

## 2024-06-16 ENCOUNTER — Ambulatory Visit

## 2024-07-17 ENCOUNTER — Ambulatory Visit: Admitting: Obstetrics and Gynecology

## 2024-07-17 ENCOUNTER — Encounter: Payer: Self-pay | Admitting: Obstetrics and Gynecology

## 2024-07-17 VITALS — BP 136/85 | HR 84

## 2024-07-17 DIAGNOSIS — R159 Full incontinence of feces: Secondary | ICD-10-CM | POA: Diagnosis not present

## 2024-07-17 DIAGNOSIS — N393 Stress incontinence (female) (male): Secondary | ICD-10-CM | POA: Diagnosis not present

## 2024-07-17 DIAGNOSIS — N812 Incomplete uterovaginal prolapse: Secondary | ICD-10-CM | POA: Diagnosis not present

## 2024-07-17 DIAGNOSIS — N3281 Overactive bladder: Secondary | ICD-10-CM

## 2024-07-17 NOTE — Patient Instructions (Addendum)
 Exam under anesthesia, total vaginal hysterectomy (removal of the uterus and cervix), bilateral salpingo-oophorectomy (removal of tubes and ovaries), uterosacral suspension (lifting the top of the vagina), anterior and posterior repair (repair of the vaginal walls), cystoscopy (camera to look in bladder), midurethral sling (for stress incontinence)

## 2024-07-17 NOTE — Progress Notes (Signed)
  Urogynecology Return Visit  SUBJECTIVE  History of Present Illness: Pamela Francis is a 63 y.o. female seen in follow-up for prolapse and bladder and bowel incontinence. She has been on Myrbetriq  50mg .   Myrbetriq  has been working well for her urgency symptoms. She has also noticed bowel leakage has improved with fiber supplement.   Notices the prolapse is about the same but bothering her more. Has to wear a pad all the time now for leakage. More often with cough, sneeze and walking.   Past Medical History: Patient  has a past medical history of Allergy, Anxiety, Arthritis, Blood in stool, Chicken pox, Depression, Frequent headaches, Hyperlipidemia, Hypertension, and Urine incontinence.   Past Surgical History: She  has a past surgical history that includes Tonsillectomy and adenoidectomy; Wrist surgery (Left); Colonoscopy; Breast biopsy (Left, 02/2005); and LEEP.   Medications: She has a current medication list which includes the following prescription(s): calcium carb-cholecalciferol, vitamin d , cyanocobalamin, cyclobenzaprine , estradiol , fexofenadine, fluticasone , lisinopril , mirabegron  er, nortriptyline , sertraline , simvastatin , and sumatriptan .   Allergies: Patient has no known allergies.   Social History: Patient  reports that she has never smoked. She has never been exposed to tobacco smoke. She has never used smokeless tobacco. She reports current alcohol use. She reports that she does not use drugs.     OBJECTIVE     Physical Exam: Vitals:   07/17/24 1458  BP: 136/85  Pulse: 84   Gen: No apparent distress, A&O x 3.  Detailed Urogynecologic Evaluation:  Normal external genitalia. On speculum, normal vaginal mucosa and normal appearing cervix. On bimanual, uterus is small, mobile and nontender.   POP-Q  -1                                            Aa   -1                                           Ba  -8                                               C   3.5                                            Gh  4                                            Pb  10                                            tvl   0  Ap  0                                            Bp  -8                                              D   Prior exam: positive cough stress test     ASSESSMENT AND PLAN    Ms. Pongratz is a 63 y.o. with:  1. Overactive bladder   2. Uterovaginal prolapse, incomplete   3. Incontinence of feces, unspecified fecal incontinence type   4. SUI (stress urinary incontinence, female)    - Continue Myrbetriq  for OAB symptoms.  - Continue fiber supplementation for bowel leakage.   Plan for surgery: Exam under anesthesia, total vaginal hysterectomy, bilateral salpingo-oophorectomy, uterosacral suspension, anterior and posterior repair, cystoscopy , midurethral sling  - We reviewed the patient's specific anatomic and functional findings, with the assistance of diagrams, and together finalized the above procedure. The planned surgical procedures were discussed along with the surgical risks outlined below, which were also provided on a detailed handout. Additional treatment options including expectant management, conservative management, medical management were discussed where appropriate.  We reviewed the benefits and risks of each treatment option.   General Surgical Risks: For all procedures, there are risks of bleeding, infection, damage to surrounding organs including but not limited to bowel, bladder, blood vessels, ureters and nerves, and need for further surgery if an injury were to occur. These risks are all low with minimally invasive surgery.   There are risks of numbness and weakness at any body site or buttock/rectal pain.  It is possible that baseline pain can be worsened by surgery, either with or without mesh. If surgery is vaginal, there is also a low risk of possible conversion to  laparoscopy or open abdominal incision where indicated. Very rare risks include blood transfusion, blood clot, heart attack, pneumonia, or death.   There is also a risk of short-term postoperative urinary retention with need to use a catheter. About half of patients need to go home from surgery with a catheter, which is then later removed in the office. The risk of long-term need for a catheter is very low. There is also a risk of worsening of overactive bladder.   Sling: The effectiveness of a midurethral vaginal mesh sling is approximately 85%, and thus, there will be times when you may leak urine after surgery, especially if your bladder is full or if you have a strong cough. There is a balance between making the sling tight enough to treat your leakage but not too tight so that you have long-term difficulty emptying your bladder. A mesh sling will not directly treat overactive bladder/urge incontinence and may worsen it.  There is an FDA safety notification on vaginal mesh procedures for prolapse but NOT mesh slings. We have extensive experience and training with mesh placement and we have close postoperative follow up to identify any potential complications from mesh. It is important to realize that this mesh is a permanent implant that cannot be easily removed. There are rare risks of mesh exposure (2-4%), pain with intercourse (0-7%), and infection (<1%). The risk of mesh exposure if more likely in a woman with risks  for poor healing (prior radiation, poorly controlled diabetes, or immunocompromised). The risk of new or worsened chronic pain after mesh implant is more common in women with baseline chronic pain and/or poorly controlled anxiety or depression. Approximately 2-4% of patients will experience longer-term post-operative voiding dysfunction that may require surgical revision of the sling. We also reviewed that postoperatively, her stream may not be as strong as before surgery.   Prolapse (with  or without mesh): Risk factors for surgical failure  include things that put pressure on your pelvis and the surgical repair, including obesity, chronic cough, and heavy lifting or straining (including lifting children or adults, straining on the toilet, or lifting heavy objects such as furniture or anything weighing >25 lbs. Risks of recurrence is 20-30% with vaginal native tissue repair and a less than 10% with sacrocolpopexy with mesh.     - For preop Visit:  She is required to have a visit within 30 days of her surgery.   - Medical clearance: not required  - Anticoagulant use: No - Medicaid Hysterectomy form: No - Accepts blood transfusion: Yes - Expected length of stay: outpatient  Request sent for surgery scheduling.   [name], MD

## 2024-07-27 ENCOUNTER — Other Ambulatory Visit: Payer: Self-pay

## 2024-07-27 DIAGNOSIS — Z1231 Encounter for screening mammogram for malignant neoplasm of breast: Secondary | ICD-10-CM

## 2024-07-29 ENCOUNTER — Encounter

## 2024-07-29 ENCOUNTER — Other Ambulatory Visit: Payer: Self-pay | Admitting: Family Medicine

## 2024-07-29 ENCOUNTER — Other Ambulatory Visit: Payer: Self-pay

## 2024-07-29 DIAGNOSIS — E782 Mixed hyperlipidemia: Secondary | ICD-10-CM

## 2024-07-29 DIAGNOSIS — F32A Depression, unspecified: Secondary | ICD-10-CM

## 2024-08-07 ENCOUNTER — Encounter: Payer: Self-pay | Admitting: Obstetrics and Gynecology

## 2024-08-08 ENCOUNTER — Other Ambulatory Visit: Payer: Self-pay

## 2024-08-08 DIAGNOSIS — I10 Essential (primary) hypertension: Secondary | ICD-10-CM

## 2024-08-11 ENCOUNTER — Other Ambulatory Visit

## 2024-08-11 DIAGNOSIS — Z13228 Encounter for screening for other metabolic disorders: Secondary | ICD-10-CM | POA: Diagnosis not present

## 2024-08-11 DIAGNOSIS — Z130 Encounter for screening for diseases of the blood and blood-forming organs and certain disorders involving the immune mechanism: Secondary | ICD-10-CM | POA: Diagnosis not present

## 2024-08-11 DIAGNOSIS — Z1329 Encounter for screening for other suspected endocrine disorder: Secondary | ICD-10-CM | POA: Diagnosis not present

## 2024-08-11 DIAGNOSIS — R5383 Other fatigue: Secondary | ICD-10-CM

## 2024-08-11 DIAGNOSIS — Z1321 Encounter for screening for nutritional disorder: Secondary | ICD-10-CM | POA: Diagnosis not present

## 2024-08-11 DIAGNOSIS — Z6829 Body mass index (BMI) 29.0-29.9, adult: Secondary | ICD-10-CM

## 2024-08-12 ENCOUNTER — Ambulatory Visit: Payer: Self-pay

## 2024-08-12 LAB — LIPID PANEL
Chol/HDL Ratio: 2.8 ratio (ref 0.0–4.4)
Cholesterol, Total: 169 mg/dL (ref 100–199)
HDL: 61 mg/dL (ref >39)
LDL Chol Calc (NIH): 91 mg/dL (ref 0–99)
Triglycerides: 90 mg/dL (ref 0–149)
VLDL Cholesterol Cal: 17 mg/dL (ref 5–40)

## 2024-08-12 LAB — TSH: TSH: 3.63 u[IU]/mL (ref 0.450–4.500)

## 2024-08-12 LAB — CBC WITH DIFFERENTIAL/PLATELET
Basophils Absolute: 0 x10E3/uL (ref 0.0–0.2)
Basos: 1 %
EOS (ABSOLUTE): 0.2 x10E3/uL (ref 0.0–0.4)
Eos: 5 %
Hematocrit: 38.7 % (ref 34.0–46.6)
Hemoglobin: 12.7 g/dL (ref 11.1–15.9)
Immature Grans (Abs): 0 x10E3/uL (ref 0.0–0.1)
Immature Granulocytes: 0 %
Lymphocytes Absolute: 1.3 x10E3/uL (ref 0.7–3.1)
Lymphs: 27 %
MCH: 30.6 pg (ref 26.6–33.0)
MCHC: 32.8 g/dL (ref 31.5–35.7)
MCV: 93 fL (ref 79–97)
Monocytes Absolute: 0.4 x10E3/uL (ref 0.1–0.9)
Monocytes: 9 %
Neutrophils Absolute: 2.8 x10E3/uL (ref 1.4–7.0)
Neutrophils: 58 %
Platelets: 281 x10E3/uL (ref 150–450)
RBC: 4.15 x10E6/uL (ref 3.77–5.28)
RDW: 12.2 % (ref 11.7–15.4)
WBC: 4.8 x10E3/uL (ref 3.4–10.8)

## 2024-08-12 LAB — COMPREHENSIVE METABOLIC PANEL WITH GFR
ALT: 16 IU/L (ref 0–32)
AST: 23 IU/L (ref 0–40)
Albumin: 4.6 g/dL (ref 3.9–4.9)
Alkaline Phosphatase: 54 IU/L (ref 49–135)
BUN/Creatinine Ratio: 15 (ref 12–28)
BUN: 14 mg/dL (ref 8–27)
Bilirubin Total: 0.6 mg/dL (ref 0.0–1.2)
CO2: 24 mmol/L (ref 20–29)
Calcium: 9.6 mg/dL (ref 8.7–10.3)
Chloride: 103 mmol/L (ref 96–106)
Creatinine, Ser: 0.93 mg/dL (ref 0.57–1.00)
Globulin, Total: 2 g/dL (ref 1.5–4.5)
Glucose: 93 mg/dL (ref 70–99)
Potassium: 4.4 mmol/L (ref 3.5–5.2)
Sodium: 140 mmol/L (ref 134–144)
Total Protein: 6.6 g/dL (ref 6.0–8.5)
eGFR: 70 mL/min/1.73 (ref >59)

## 2024-08-12 LAB — VITAMIN D 25 HYDROXY (VIT D DEFICIENCY, FRACTURES): Vit D, 25-Hydroxy: 39.7 ng/mL (ref 30.0–100.0)

## 2024-08-12 LAB — HEMOGLOBIN A1C
Est. average glucose Bld gHb Est-mCnc: 126 mg/dL
Hgb A1c MFr Bld: 6 % — ABNORMAL HIGH (ref 4.8–5.6)

## 2024-08-18 ENCOUNTER — Ambulatory Visit (INDEPENDENT_AMBULATORY_CARE_PROVIDER_SITE_OTHER)

## 2024-08-18 VITALS — BP 130/83 | HR 78 | Temp 97.8°F | Ht 63.0 in | Wt 168.0 lb

## 2024-08-18 DIAGNOSIS — I10 Essential (primary) hypertension: Secondary | ICD-10-CM | POA: Diagnosis not present

## 2024-08-18 DIAGNOSIS — M2550 Pain in unspecified joint: Secondary | ICD-10-CM

## 2024-08-18 DIAGNOSIS — G43009 Migraine without aura, not intractable, without status migrainosus: Secondary | ICD-10-CM

## 2024-08-18 DIAGNOSIS — M26609 Unspecified temporomandibular joint disorder, unspecified side: Secondary | ICD-10-CM | POA: Diagnosis not present

## 2024-08-18 DIAGNOSIS — F32A Depression, unspecified: Secondary | ICD-10-CM

## 2024-08-18 DIAGNOSIS — F419 Anxiety disorder, unspecified: Secondary | ICD-10-CM

## 2024-08-18 DIAGNOSIS — E782 Mixed hyperlipidemia: Secondary | ICD-10-CM

## 2024-08-18 DIAGNOSIS — Z0000 Encounter for general adult medical examination without abnormal findings: Secondary | ICD-10-CM | POA: Insufficient documentation

## 2024-08-18 DIAGNOSIS — N952 Postmenopausal atrophic vaginitis: Secondary | ICD-10-CM

## 2024-08-18 DIAGNOSIS — N3281 Overactive bladder: Secondary | ICD-10-CM

## 2024-08-18 DIAGNOSIS — G43909 Migraine, unspecified, not intractable, without status migrainosus: Secondary | ICD-10-CM | POA: Insufficient documentation

## 2024-08-18 DIAGNOSIS — N812 Incomplete uterovaginal prolapse: Secondary | ICD-10-CM

## 2024-08-18 MED ORDER — DULOXETINE HCL 20 MG PO CPEP: 20.0000 mg | ORAL_CAPSULE | Freq: Every day | ORAL | 2 refills | Status: AC

## 2024-08-18 MED ORDER — SIMVASTATIN 20 MG PO TABS: ORAL_TABLET | ORAL | 2 refills | Status: AC

## 2024-08-18 MED ORDER — LISINOPRIL 2.5 MG PO TABS: 2.5000 mg | ORAL_TABLET | Freq: Every day | ORAL | 2 refills | Status: DC

## 2024-08-18 MED ORDER — CYCLOBENZAPRINE HCL 5 MG PO TABS: ORAL_TABLET | ORAL | 1 refills | Status: AC

## 2024-08-18 NOTE — Assessment & Plan Note (Signed)
 Flu shot received. Pap smear last done in 2023. Mammogram order in place. Shingles and tetanus vaccines up to date. Colonoscopy due in 2029. COVID and pneumonia vaccines discussed. - Ensure mammogram is completed. - Consider pneumonia vaccine in the future.

## 2024-08-18 NOTE — Assessment & Plan Note (Signed)
 Has not taken Zoloft  in over a week so no taper off required. Given suspicion for fibromyalgia, will do a trial of Cymbalta for dual benefit of mood improvement and join pain relief.

## 2024-08-18 NOTE — Assessment & Plan Note (Signed)
 Cont vaginal estrace  cream twice weekly

## 2024-08-18 NOTE — Assessment & Plan Note (Signed)
 BP goal <140/90. BP initially above goal in office today, at goal on recheck.  Provided with BP log and asked patient to drop log off to the office in 2-4 weeks. If BP persistently above goal, will increase lisinopril  to 5 mg daily. Continue lisinopril  2.5 mg daily for now.

## 2024-08-18 NOTE — Patient Instructions (Signed)
 VISIT SUMMARY: During your visit, we reviewed your medications and discussed several health concerns including hypertension, joint pain, migraines, sleep disturbances, and preventive care. We made adjustments to your medications and provided instructions for monitoring your health at home.  YOUR PLAN: ESSENTIAL HYPERTENSION: Your blood pressure is slightly elevated, and home readings have been inconsistent. -Monitor your blood pressure at home using the provided log. Please drop the log off to the front desk in 2-4 weeks -If your home readings are consistently high, increase your lisinopril  dose to 5 mg. -We will reassess your blood pressure in a few weeks before your surgery.  MIXED HYPERLIPIDEMIA: Your cholesterol levels have improved and are now within the normal range. -Continue taking simvastatin  as prescribed.  CHRONIC JOINT PAIN WITH POSSIBLE FIBROMYALGIA: You have persistent joint pain, and we are considering fibromyalgia as a possible cause. -Start taking Cymbalta at 20 mg daily. -Monitor your response to Cymbalta and we can adjust the dose as needed. -Continue taking nortriptyline .  MIGRAINE: You experienced a recent severe headache and did not use sumatriptan . -Continue using sumatriptan  as needed for migraines. -Monitor your response to Cymbalta for potential improvement in migraines.  DEPRESSION AND ANXIETY: You have been inconsistently taking Zoloft , and we are considering Cymbalta for both depression and joint pain. -Discontinue Zoloft . -Start taking Cymbalta at 20 mg daily.  OVERACTIVE BLADDER: You have an overactive bladder. -Continue taking Myrbetriq  as prescribed.  GENERAL HEALTH MAINTENANCE: We discussed your preventive care and vaccinations. -Ensure you complete your mammogram. -Consider getting the pneumonia vaccine in the future.  If you have any problems before your next visit feel free to message me via MyChart (minor issues or questions) or call the office,  otherwise you may reach out to schedule an office visit.  Thank you! Saddie Sacks, PA-C

## 2024-08-18 NOTE — Progress Notes (Signed)
 Complete physical exam  Patient: Pamela Francis   DOB: [birth year]   63 y.o. Female  MRN: [id number]  Subjective:    Chief Complaint  Patient presents with   Annual Exam     History of Present Illness   Pamela Francis is a 63 year old female who presents for an annual physical exam and medication review.  Gynecologic and urologic symptoms - Scheduled for hysterectomy in early February due to ongoing uterine and bladder issues and history of abnormal Pap smears  Musculoskeletal pain - Persistent joint pain with tenderness and stiffness in joints and shoulders - Hip joint pain - Neck pain originating at the base of the head and extending downwards - Uses nortriptyline  and occasionally cyclobenzaprine  for muscle relaxation - Previous autoimmune work up negative   Headache and migraine symptoms - Experiences migraines - Uses sumatriptan  for relief - Currently taking nortriptyline  for prevention - Follow-up with neurology scheduled for February  Sleep disturbance - Difficulty falling asleep but able to stay asleep once asleep - Has not tried any natural sleep aids such as melatonin or magnesium   Blood pressure management - Does not regularly monitor blood pressure at home  - Currently taking lisinopril  2.5 mg - Denies CP, SOB, vision changes, or edema   Medication and supplement use - Current medications include calcium, vitamin D , B12 supplements, Estrace  vaginal cream, Allegra, Flonase , lisinopril , Myrbetriq , nortriptyline , simvastatin , and sumatriptan  - Not consistently taking Zoloft , with last dose one week ago  Preventive care - Received flu shot in October at work. Tdap and shingles UTD.  - Declined PCV20 today  - Pap UTD, colonoscopy UTD, mammogram due (order placed)           Most recent fall risk assessment:    08/18/2024    8:16 AM  Fall Risk   Falls in the past year? 1  Number falls in past yr: 0  Injury with Fall? 0  Follow up Falls evaluation  completed     Most recent depression screenings:    08/18/2024    8:16 AM 04/13/2024    2:36 PM  PHQ 2/9 Scores  PHQ - 2 Score 0 0  PHQ- 9 Score 5     Vision:Within last year and Dental: No current dental problems and Receives regular dental care    Patient Care Team: Gayle Saddie [name] as PCP - General (Physician Assistant)   Outpatient Medications Prior to Visit  Medication Sig   Calcium Carb-Cholecalciferol (CALCIUM 600 + D PO) Take 1 tablet by mouth daily.   Cholecalciferol (VITAMIN D  PO) Take 2,000 tablets by mouth daily.   cyanocobalamin 1000 MCG tablet Take 1,000 mcg by mouth daily.   estradiol  (ESTRACE ) 0.1 MG/GM vaginal cream 1 gram vaginally twice weekly   fexofenadine (ALLEGRA) 180 MG tablet Take 180 mg by mouth daily.   fluticasone  (FLONASE ) 50 MCG/ACT nasal spray Place 2 sprays into both nostrils daily. (Patient taking differently: Place 2 sprays into both nostrils as needed.)   mirabegron  ER (MYRBETRIQ ) 50 MG TB24 tablet TAKE 1 TABLET(50 MG) BY MOUTH DAILY   nortriptyline  (PAMELOR ) 10 MG capsule Take 3 capsules (30 mg total) by mouth at bedtime.   SUMAtriptan  (IMITREX ) 50 MG tablet May repeat in 2 hours if headache persists or recurs. (Patient taking differently: as needed. May repeat in 2 hours if headache persists or recurs.)   [DISCONTINUED] cyclobenzaprine  (FLEXERIL ) 5 MG tablet Take 1-2 tablets (5-10 mg total) by mouth at bedtime.   [DISCONTINUED] lisinopril  (  ZESTRIL ) 2.5 MG tablet TAKE 1 TABLET(2.5 MG) BY MOUTH DAILY   [DISCONTINUED] sertraline  (ZOLOFT ) 50 MG tablet TAKE 1.5 TABLETS(75MG ) BY MOUTH EVERY EVENING. MAY INCREASE TO 2 TABLETS AT BEDTIME AS INDICATED   [DISCONTINUED] simvastatin  (ZOCOR ) 20 MG tablet TAKE 1 TABLET(20 MG) BY MOUTH DAILY AT 6 PM   No facility-administered medications prior to visit.    ROS  Per HPI      Objective:     BP 130/83   Pulse 78   Temp 97.8 F (36.6 C) (Oral)   Ht 5' 3 (1.6 m)   Wt 168 lb (76.2 kg)   LMP   (LMP Unknown)   SpO2 99%   BMI 29.76 kg/m    Physical Exam Constitutional:      General: She is not in acute distress.    Appearance: Normal appearance.  Eyes:     Pupils: Pupils are equal, round, and reactive to light.  Cardiovascular:     Rate and Rhythm: Normal rate and regular rhythm.     Heart sounds: Normal heart sounds. No murmur heard.    No friction rub. No gallop.  Pulmonary:     Effort: Pulmonary effort is normal. No respiratory distress.     Breath sounds: Normal breath sounds.  Abdominal:     General: Bowel sounds are normal.     Palpations: Abdomen is soft.  Musculoskeletal:        General: No swelling.     Cervical back: Neck supple. Muscular tenderness present.  Lymphadenopathy:     Cervical: No cervical adenopathy.  Skin:    General: Skin is warm and dry.  Neurological:     General: No focal deficit present.     Mental Status: She is alert.  Psychiatric:        Mood and Affect: Mood normal.        Behavior: Behavior normal.        Thought Content: Thought content normal.       No results found for any visits on 08/18/24. Last CBC Lab Results  Component Value Date   WBC 4.8 08/11/2024   HGB 12.7 08/11/2024   HCT 38.7 08/11/2024   MCV 93 08/11/2024   MCH 30.6 08/11/2024   RDW 12.2 08/11/2024   PLT 281 08/11/2024   Last metabolic panel Lab Results  Component Value Date   GLUCOSE 93 08/11/2024   NA 140 08/11/2024   K 4.4 08/11/2024   CL 103 08/11/2024   CO2 24 08/11/2024   BUN 14 08/11/2024   CREATININE 0.93 08/11/2024   EGFR 70 08/11/2024   CALCIUM 9.6 08/11/2024   PROT 6.6 08/11/2024   ALBUMIN 4.6 08/11/2024   LABGLOB 2.0 08/11/2024   AGRATIO 2.4 (H) 01/31/2022   BILITOT 0.6 08/11/2024   ALKPHOS 54 08/11/2024   AST 23 08/11/2024   ALT 16 08/11/2024   Last lipids Lab Results  Component Value Date   CHOL 169 08/11/2024   HDL 61 08/11/2024   LDLCALC 91 08/11/2024   TRIG 90 08/11/2024   CHOLHDL 2.8 08/11/2024   Last  hemoglobin A1c Lab Results  Component Value Date   HGBA1C 6.0 (H) 08/11/2024   Last thyroid  functions Lab Results  Component Value Date   TSH 3.630 08/11/2024   FREET4 1.08 03/04/2023   Last vitamin D  Lab Results  Component Value Date   VD25OH 39.7 08/11/2024        Assessment & Plan:    Routine Health Maintenance and Physical Exam  Health Maintenance  Topic Date Due   COVID-19 Vaccine (4 - 2025-26 season) 09/03/2024*   Pneumococcal Vaccine for age over 13 (1 of 1 - PCV) 08/18/2025*   Breast Cancer Screening  05/15/2025   Colon Cancer Screening  12/05/2027   Pap with HPV screening  06/30/2028   DTaP/Tdap/Td vaccine (3 - Td or Tdap) 11/03/2028   Flu Shot  Completed   Hepatitis C Screening  Completed   HIV Screening  Completed   Zoster (Shingles) Vaccine  Completed   Hepatitis B Vaccine  Aged Out   HPV Vaccine  Aged Out   Meningitis B Vaccine  Aged Out  *Topic was postponed. The date shown is not the original due date.    Discussed health benefits of physical activity, and encouraged her to engage in regular exercise appropriate for her age and condition.  Vaginal atrophy Assessment & Plan: Cont vaginal estrace  cream twice weekly    Essential hypertension Assessment & Plan: BP goal <140/90. BP initially above goal in office today, at goal on recheck.  Provided with BP log and asked patient to drop log off to the office in 2-4 weeks. If BP persistently above goal, will increase lisinopril  to 5 mg daily. Continue lisinopril  2.5 mg daily for now.   Orders: -     Lisinopril ; Take 1 tablet (2.5 mg total) by mouth daily.  Dispense: 90 tablet; Refill: 2  Mixed hyperlipidemia Assessment & Plan: Last lipid panel: LDL 91, HDL 61, Trig 90. The 10-year ASCVD risk score (Arnett DK, et al., 2019) is: 4.2% Continue simvastatin  20 mg daily.   Orders: -     Simvastatin ; TAKE 1 TABLET(20 MG) BY MOUTH DAILY AT 6 PM  Dispense: 90 tablet; Refill: 2  TMJ disease -      Cyclobenzaprine  HCl; Take 1-2 tablets (5-10 mg total) by mouth at bedtime.  Dispense: 30 tablet; Refill: 1  Uterovaginal prolapse, incomplete Assessment & Plan: Following with OB/GYN and is scheduled for hysterectomy and prolapse repair in Feb 2026.    Overactive bladder Assessment & Plan: Continue Myrbetriq  50 mg daily as prescribed by OBGYN   Arthralgia, unspecified joint Assessment & Plan: Autoimmune workup in August negative. Suspect some degree of fibromyalgia. Discussed Widespread Pain Index and trial of Cymbalta. Patient agreeable. Will start with Cymbalta 20 mg daily and slowly titrate as tolerated.  Patient has not been taking Zoloft  in over a week so no taper off Zoloft  required.    Anxiety and depression Assessment & Plan: Has not taken Zoloft  in over a week so no taper off required. Given suspicion for fibromyalgia, will do a trial of Cymbalta for dual benefit of mood improvement and join pain relief.   Migraine without aura and without status migrainosus, not intractable Assessment & Plan: Continue nortriptyline  daily for prevention and Imitrex  as needed for acute HA//migraine. Stable. Follows with neurology. Flexeril  as needed which was refilled today. No longer doing PT for cervicogenic HA. Follow up with Neurology scheduled for Feb. Will cont to monitor.    Healthcare maintenance Assessment & Plan: Flu shot received. Pap smear last done in 2023. Mammogram order in place. Shingles and tetanus vaccines up to date. Colonoscopy due in 2029. COVID and pneumonia vaccines discussed. - Ensure mammogram is completed. - Consider pneumonia vaccine in the future.    Other orders -     DULoxetine HCl; Take 1 capsule (20 mg total) by mouth daily.  Dispense: 90 capsule; Refill: 2    Return in about 3  months (around 11/16/2024) for Mood/fibromyalgia, HTN.     Saddie JULIANNA Sacks, PA-C

## 2024-08-18 NOTE — Assessment & Plan Note (Signed)
 Autoimmune workup in August negative. Suspect some degree of fibromyalgia. Discussed Widespread Pain Index and trial of Cymbalta. Patient agreeable. Will start with Cymbalta 20 mg daily and slowly titrate as tolerated.  Patient has not been taking Zoloft  in over a week so no taper off Zoloft  required.

## 2024-08-18 NOTE — Assessment & Plan Note (Signed)
 Continue nortriptyline  daily for prevention and Imitrex  as needed for acute HA//migraine. Stable. Follows with neurology. Flexeril  as needed which was refilled today. No longer doing PT for cervicogenic HA. Follow up with Neurology scheduled for Feb. Will cont to monitor.

## 2024-08-18 NOTE — Assessment & Plan Note (Signed)
 Last lipid panel: LDL 91, HDL 61, Trig 90. The 10-year ASCVD risk score (Arnett DK, et al., 2019) is: 4.2% Continue simvastatin  20 mg daily.

## 2024-08-18 NOTE — Assessment & Plan Note (Signed)
 Following with OB/GYN and is scheduled for hysterectomy and prolapse repair in Feb 2026.

## 2024-08-18 NOTE — Assessment & Plan Note (Signed)
 Continue Myrbetriq  50 mg daily as prescribed by OBGYN

## 2024-09-11 ENCOUNTER — Other Ambulatory Visit (HOSPITAL_BASED_OUTPATIENT_CLINIC_OR_DEPARTMENT_OTHER): Payer: Self-pay | Admitting: Certified Nurse Midwife

## 2024-09-11 ENCOUNTER — Other Ambulatory Visit: Payer: Self-pay

## 2024-09-11 DIAGNOSIS — N952 Postmenopausal atrophic vaginitis: Secondary | ICD-10-CM

## 2024-09-11 DIAGNOSIS — N3946 Mixed incontinence: Secondary | ICD-10-CM

## 2024-09-11 MED ORDER — LISINOPRIL 5 MG PO TABS: 5.0000 mg | ORAL_TABLET | Freq: Every day | ORAL | 3 refills | Status: AC

## 2024-09-28 ENCOUNTER — Encounter: Payer: Self-pay | Admitting: *Deleted

## 2024-09-29 ENCOUNTER — Telehealth: Payer: Self-pay | Admitting: *Deleted

## 2024-09-29 NOTE — Telephone Encounter (Signed)
 TC from pt inquiring about FMLA paperwork.  I called patient back and advised they are on Dr Susen desk to be signed.  We will follow up on her tomorrow.  Thanks Sotero CMA

## 2024-09-30 ENCOUNTER — Telehealth: Payer: Self-pay

## 2024-09-30 NOTE — Telephone Encounter (Signed)
 [employer]inger FMLA paperwork is ready for pick up. Please drop a charge for patient.

## 2024-10-01 ENCOUNTER — Encounter: Payer: Self-pay | Admitting: *Deleted

## 2024-10-05 ENCOUNTER — Encounter: Admitting: Obstetrics and Gynecology

## 2024-10-05 ENCOUNTER — Encounter: Payer: Self-pay | Admitting: *Deleted

## 2024-10-09 ENCOUNTER — Ambulatory Visit: Admitting: Obstetrics and Gynecology

## 2024-10-09 ENCOUNTER — Encounter: Payer: Self-pay | Admitting: Obstetrics and Gynecology

## 2024-10-09 VITALS — BP 128/84 | HR 88 | Ht 63.0 in | Wt 175.6 lb

## 2024-10-09 DIAGNOSIS — N812 Incomplete uterovaginal prolapse: Secondary | ICD-10-CM

## 2024-10-09 DIAGNOSIS — N393 Stress incontinence (female) (male): Secondary | ICD-10-CM

## 2024-10-09 DIAGNOSIS — N952 Postmenopausal atrophic vaginitis: Secondary | ICD-10-CM

## 2024-10-09 DIAGNOSIS — Z01818 Encounter for other preprocedural examination: Secondary | ICD-10-CM

## 2024-10-09 DIAGNOSIS — Z0289 Encounter for other administrative examinations: Secondary | ICD-10-CM

## 2024-10-09 MED ORDER — IBUPROFEN 600 MG PO TABS: 600.0000 mg | ORAL_TABLET | Freq: Four times a day (QID) | ORAL | 0 refills | Status: AC | PRN

## 2024-10-09 MED ORDER — ESTRADIOL 0.01 % VA CREA: TOPICAL_CREAM | VAGINAL | 11 refills | Status: AC

## 2024-10-09 MED ORDER — POLYETHYLENE GLYCOL 3350 17 GM/SCOOP PO POWD: 17.0000 g | Freq: Every day | ORAL | 0 refills | Status: AC

## 2024-10-09 MED ORDER — OXYCODONE HCL 5 MG PO TABS: 5.0000 mg | ORAL_TABLET | ORAL | 0 refills | Status: AC | PRN

## 2024-10-09 MED ORDER — ACETAMINOPHEN 500 MG PO TABS: 500.0000 mg | ORAL_TABLET | Freq: Four times a day (QID) | ORAL | 0 refills | Status: AC | PRN

## 2024-10-09 NOTE — Progress Notes (Signed)
 [facility] Return visit  Subjective   History of Present Illness: Pamela Francis is a 64 y.o. female who presents for return visit.  She is scheduled to undergo Exam under anesthesia, total vaginal hysterectomy, bilateral salpingo-oophorectomy, uterosacral suspension, anterior and posterior repair, cystoscopy , midurethral sling on 10/19/24.  Her symptoms include vaginal bulge and incontinence, and she was was found to have Stage II anterior, Stage II posterior, Stage I apical prolapse.   positive cough stress test- UDS deferred  Past Medical History:  Diagnosis Date   Allergy    Anxiety    Arthritis    Blood in stool    Chicken pox    Depression    Frequent headaches    Hyperlipidemia    Hypertension    Urine incontinence      Past Surgical History:  Procedure Laterality Date   BREAST BIOPSY Left 02/2005   neg   COLONOSCOPY     Dr. Tamea [place], [place] 2013   LEEP     TONSILLECTOMY AND ADENOIDECTOMY     WRIST SURGERY Left     has no known allergies.   Family History  Problem Relation Age of Onset   Rheum arthritis Mother    Arthritis Mother    Hyperlipidemia Mother    Hypertension Mother    Colon polyps Mother    Lung cancer Father 45       smoking hx   Rheum arthritis Maternal Grandmother    Stroke Maternal Grandmother    Alcohol abuse Maternal Grandfather    Prostate cancer Maternal Uncle 56   Hodgkin's lymphoma Maternal Uncle 74   Prostate cancer Maternal Uncle 19       mets   Cancer Paternal Aunt        x3 pat aunts; unknown cancer; dx unknown age   Cancer Paternal Uncle        unknown cancer; dx unknown age   Breast cancer Cousin 50       bilateral; mat female cousin   Cancer - Other Cousin        PALB2 gene mutation   Breast cancer Cousin 68       triple negative; mat female cousin   Ovarian cancer Cousin 11       d. 3   Lung cancer Cousin 46       mat female cousin   Hodgkin's lymphoma Half-Sister 64       d. 35; pat half  sister   Breast cancer Other        MGF's sister; d. 44s   Colon cancer Neg Hx    Esophageal cancer Neg Hx    Pancreatic cancer Neg Hx    Stomach cancer Neg Hx    Rectal cancer Neg Hx    Liver cancer Neg Hx     Social History[1]   Review of Systems was negative for a full 10 system review except as noted in the History of Present Illness.  Current Medications[2]   Objective Vitals:   10/09/24 1129  BP: 128/84  Pulse: 88    Gen: NAD CV: S1 S2 RRR Lungs: Clear to auscultation bilaterally Abd: soft, nontender   Previous Pelvic Exam showed: POP-Q   -1                                            Aa   -  1                                           Ba   -8                                              C    3.5                                            Gh   4                                            Pb   10                                            tvl    0                                            Ap   0                                            Bp   -8                                              D       Assessment/ Plan  Assessment: The patient is a 64 y.o. year old scheduled to undergo Exam under anesthesia, total vaginal hysterectomy, bilateral salpingo-oophorectomy, uterosacral suspension, anterior and posterior repair, cystoscopy , midurethral sling.   Plan: General Surgical Consent: The patient has previously been counseled on alternative treatments, and the decision by the patient and provider was to proceed with the procedure listed above.  For all procedures, there are risks of bleeding, infection, damage to surrounding organs including but not limited to bowel, bladder, blood vessels, ureters and nerves, and need for further surgery if an injury were to occur. These risks are all low with minimally invasive surgery.   There are risks of numbness and weakness at any body site or buttock/rectal pain.  It is possible that baseline pain can be  worsened by surgery, either with or without mesh. If surgery is vaginal, there is also a low risk of possible conversion to laparoscopy or open abdominal incision where indicated. Very rare risks include blood transfusion, blood clot, heart attack, pneumonia, or death.   There is also a risk of short-term postoperative urinary retention with need to use a catheter. About half of patients need to go home from surgery with a catheter, which is then later removed in  the office. The risk of long-term need for a catheter is very low. There is also a risk of worsening of overactive bladder.   Sling: The effectiveness of a midurethral vaginal mesh sling is approximately 85%, and thus, there will be times when you may leak urine after surgery, especially if your bladder is full or if you have a strong cough. There is a balance between making the sling tight enough to treat your leakage but not too tight so that you have long-term difficulty emptying your bladder. A mesh sling will not directly treat overactive bladder/urge incontinence and may worsen it.  There is an FDA safety notification on vaginal mesh procedures for prolapse but NOT mesh slings. We have extensive experience and training with mesh placement and we have close postoperative follow up to identify any potential complications from mesh. It is important to realize that this mesh is a permanent implant that cannot be easily removed. There are rare risks of mesh exposure (2-4%), pain with intercourse (0-7%), and infection (<1%). The risk of mesh exposure if more likely in a woman with risks for poor healing (prior radiation, poorly controlled diabetes, or immunocompromised). The risk of new or worsened chronic pain after mesh implant is more common in women with baseline chronic pain and/or poorly controlled anxiety or depression. Approximately 2-4% of patients will experience longer-term post-operative voiding dysfunction that may require surgical  revision of the sling. We also reviewed that postoperatively, her stream may not be as strong as before surgery.    Prolapse (with or without mesh): Risk factors for surgical failure  include things that put pressure on your pelvis and the surgical repair, including obesity, chronic cough, and heavy lifting or straining (including lifting children or adults, straining on the toilet, or lifting heavy objects such as furniture or anything weighing >25 lbs. Risks of recurrence is 20-30% with vaginal native tissue repair and a less than 10% with sacrocolpopexy with mesh.    We discussed consent for blood products. Risks for blood transfusion include allergic reactions, other reactions that can affect different body organs and managed accordingly, transmission of infectious diseases such as HIV or Hepatitis. However, the blood is screened. Patient consents for blood products.  Pre-operative instructions:  She was instructed to not take Aspirin/NSAIDs x 7days prior to surgery.  Antibiotic prophylaxis was ordered as indicated.  Catheter use: Patient will go home with foley if needed after post-operative voiding trial.  Post-operative instructions:  She was provided with specific post-operative instructions, including precautions and signs/symptoms for which we would recommend contacting us , in addition to daytime and after-hours contact phone numbers. This was provided on a handout.   Post-operative medications: Prescriptions for motrin, tylenol, miralax, and oxycodone were sent to her pharmacy. Discussed using ibuprofen and tylenol on a schedule to limit use of narcotics.   Laboratory testing:  We will check labs: CBC, type and screen  Preoperative clearance:  She does not require surgical clearance.    Post-operative follow-up:  A post-operative appointment will be made for 6 weeks from the date of surgery. If she needs a post-operative nurse visit for a voiding trial, that will be set up after she  leaves the hospital.    Patient will call the clinic or use MyChart should anything change or any new issues arise.   [name], MD  Time spent: I spent 20 minutes dedicated to the care of this patient on the date of this encounter to include pre-visit review of records,  face-to-face time with the patient and post visit documentation.     [1]  Social History Tobacco Use   Smoking status: Never    Passive exposure: Never   Smokeless tobacco: Never  Vaping Use   Vaping status: Never Used  Substance Use Topics   Alcohol use: Yes    Alcohol/week: 0.0 standard drinks of alcohol    Comment: occasional   Drug use: No  [2]  Current Outpatient Medications:    Calcium Carb-Cholecalciferol (CALCIUM 600 + D PO), Take 1 tablet by mouth daily., Disp: , Rfl:    Cholecalciferol (VITAMIN D  PO), Take 2,000 tablets by mouth daily., Disp: , Rfl:    cyanocobalamin  1000 MCG tablet, Take 1,000 mcg by mouth daily., Disp: , Rfl:    cyclobenzaprine  (FLEXERIL ) 5 MG tablet, Take 1-2 tablets (5-10 mg total) by mouth at bedtime., Disp: 30 tablet, Rfl: 1   DULoxetine  (CYMBALTA ) 20 MG capsule, Take 1 capsule (20 mg total) by mouth daily., Disp: 90 capsule, Rfl: 2   estradiol  (ESTRACE ) 0.1 MG/GM vaginal cream, 1 gram vaginally twice weekly, Disp: 42.5 g, Rfl: 4   fexofenadine (ALLEGRA) 180 MG tablet, Take 180 mg by mouth daily., Disp: , Rfl:    fluticasone  (FLONASE ) 50 MCG/ACT nasal spray, Place 2 sprays into both nostrils daily. (Patient taking differently: Place 2 sprays into both nostrils as needed.), Disp: 16 g, Rfl: 6   lisinopril  (ZESTRIL ) 5 MG tablet, Take 1 tablet (5 mg total) by mouth daily., Disp: 90 tablet, Rfl: 3   mirabegron  ER (MYRBETRIQ ) 50 MG TB24 tablet, TAKE 1 TABLET(50 MG) BY MOUTH DAILY, Disp: 30 tablet, Rfl: 5   nortriptyline  (PAMELOR ) 10 MG capsule, Take 3 capsules (30 mg total) by mouth at bedtime., Disp: 90 capsule, Rfl: 5   simvastatin  (ZOCOR ) 20 MG tablet, TAKE 1 TABLET(20  MG) BY MOUTH DAILY AT 6 PM, Disp: 90 tablet, Rfl: 2   SUMAtriptan  (IMITREX ) 50 MG tablet, May repeat in 2 hours if headache persists or recurs. (Patient taking differently: as needed. May repeat in 2 hours if headache persists or recurs.), Disp: 10 tablet, Rfl: 6

## 2024-10-09 NOTE — H&P (Signed)
 [facility]rogynecology Pre Op H&P  Subjective  History of Present Illness: Pamela Francis is a 64 y.o. female who presents for return visit.  She is scheduled to undergo Exam under anesthesia, total vaginal hysterectomy, bilateral salpingo-oophorectomy, uterosacral suspension, anterior and posterior repair, cystoscopy , midurethral sling on 10/19/24.  Her symptoms include vaginal bulge and incontinence, and she was was found to have Stage II anterior, Stage II posterior, Stage I apical prolapse.   positive cough stress test- UDS deferred  Past Medical History:  Diagnosis Date   Allergy    Anxiety    Arthritis    Blood in stool    Chicken pox    Depression    Frequent headaches    Hyperlipidemia    Hypertension    Urine incontinence      Past Surgical History:  Procedure Laterality Date   BREAST BIOPSY Left 02/2005   neg   COLONOSCOPY     Dr. Tamea [place], [place] 2013   LEEP     TONSILLECTOMY AND ADENOIDECTOMY     WRIST SURGERY Left     has no known allergies.   Family History  Problem Relation Age of Onset   Rheum arthritis Mother    Arthritis Mother    Hyperlipidemia Mother    Hypertension Mother    Colon polyps Mother    Lung cancer Father 62       smoking hx   Rheum arthritis Maternal Grandmother    Stroke Maternal Grandmother    Alcohol abuse Maternal Grandfather    Prostate cancer Maternal Uncle 92   Hodgkin's lymphoma Maternal Uncle 74   Prostate cancer Maternal Uncle 104       mets   Cancer Paternal Aunt        x3 pat aunts; unknown cancer; dx unknown age   Cancer Paternal Uncle        unknown cancer; dx unknown age   Breast cancer Cousin 50       bilateral; mat female cousin   Cancer - Other Cousin        PALB2 gene mutation   Breast cancer Cousin 74       triple negative; mat female cousin   Ovarian cancer Cousin 11       d. 69   Lung cancer Cousin 30       mat female cousin   Hodgkin's lymphoma Half-Sister 36       d. 50; pat half sister    Breast cancer Other        MGF's sister; d. 19s   Colon cancer Neg Hx    Esophageal cancer Neg Hx    Pancreatic cancer Neg Hx    Stomach cancer Neg Hx    Rectal cancer Neg Hx    Liver cancer Neg Hx     Social History[1]   Review of Systems was negative for a full 10 system review except as noted in the History of Present Illness.  Current Medications[2]   Objective There were no vitals filed for this visit.   Gen: NAD CV: S1 S2 RRR Lungs: Clear to auscultation bilaterally Abd: soft, nontender   Previous Pelvic Exam showed: POP-Q   -1                                            Aa   -1  Ba   -8                                              C    3.5                                            Gh   4                                            Pb   10                                            tvl    0                                            Ap   0                                            Bp   -8                                              D       Assessment/ Plan  The patient is a 64 y.o. year old with stage II POP scheduled to undergo Exam under anesthesia, total vaginal hysterectomy, bilateral salpingo-oophorectomy, uterosacral suspension, anterior and posterior repair, cystoscopy , midurethral sling.      Pamela LOISE Caper, MD      [1]  Social History Tobacco Use   Smoking status: Never    Passive exposure: Never   Smokeless tobacco: Never  Vaping Use   Vaping status: Never Used  Substance Use Topics   Alcohol use: Yes    Alcohol/week: 0.0 standard drinks of alcohol    Comment: occasional   Drug use: No  [2] No current facility-administered medications for this encounter.  Current Outpatient Medications:    acetaminophen (TYLENOL) 500 MG tablet, Take 1 tablet (500 mg total) by mouth every 6 (six) hours as needed (pain)., Disp: 30 tablet, Rfl: 0   Calcium Carb-Cholecalciferol (CALCIUM  600 + D PO), Take 1 tablet by mouth daily., Disp: , Rfl:    Cholecalciferol (VITAMIN D  PO), Take 2,000 tablets by mouth daily., Disp: , Rfl:    cyanocobalamin  1000 MCG tablet, Take 1,000 mcg by mouth daily., Disp: , Rfl:    cyclobenzaprine  (FLEXERIL ) 5 MG tablet, Take 1-2 tablets (5-10 mg total) by mouth at bedtime., Disp: 30 tablet, Rfl: 1   DULoxetine  (CYMBALTA ) 20 MG capsule, Take 1 capsule (20 mg total) by mouth daily., Disp: 90 capsule, Rfl: 2   [START ON 10/12/2024] estradiol  (ESTRACE ) 0.01 % CREA vaginal cream, Place  0.5 twice a week, Disp: 42.5 g, Rfl: 11   estradiol  (ESTRACE ) 0.1 MG/GM vaginal cream, 1 gram vaginally twice weekly, Disp: 42.5 g, Rfl: 4   fexofenadine (ALLEGRA) 180 MG tablet, Take 180 mg by mouth daily., Disp: , Rfl:    fluticasone  (FLONASE ) 50 MCG/ACT nasal spray, Place 2 sprays into both nostrils daily. (Patient taking differently: Place 2 sprays into both nostrils as needed.), Disp: 16 g, Rfl: 6   ibuprofen (ADVIL) 600 MG tablet, Take 1 tablet (600 mg total) by mouth every 6 (six) hours as needed., Disp: 30 tablet, Rfl: 0   lisinopril  (ZESTRIL ) 5 MG tablet, Take 1 tablet (5 mg total) by mouth daily., Disp: 90 tablet, Rfl: 3   mirabegron  ER (MYRBETRIQ ) 50 MG TB24 tablet, TAKE 1 TABLET(50 MG) BY MOUTH DAILY, Disp: 30 tablet, Rfl: 5   nortriptyline  (PAMELOR ) 10 MG capsule, Take 3 capsules (30 mg total) by mouth at bedtime., Disp: 90 capsule, Rfl: 5   oxyCODONE (OXY IR/ROXICODONE) 5 MG immediate release tablet, Take 1 tablet (5 mg total) by mouth every 4 (four) hours as needed for severe pain (pain score 7-10)., Disp: 15 tablet, Rfl: 0   polyethylene glycol powder (GLYCOLAX/MIRALAX) 17 GM/SCOOP powder, Take 17 g by mouth daily. Drink 17g (1 scoop) dissolved in water per day., Disp: 255 g, Rfl: 0   simvastatin  (ZOCOR ) 20 MG tablet, TAKE 1 TABLET(20 MG) BY MOUTH DAILY AT 6 PM, Disp: 90 tablet, Rfl: 2   SUMAtriptan  (IMITREX ) 50 MG tablet, May repeat in 2 hours if headache persists  or recurs. (Patient taking differently: as needed. May repeat in 2 hours if headache persists or recurs.), Disp: 10 tablet, Rfl: 6

## 2024-10-09 NOTE — Telephone Encounter (Signed)
 Charged dropped today by Greig KD

## 2024-10-12 ENCOUNTER — Encounter: Admitting: Obstetrics and Gynecology

## 2024-10-14 ENCOUNTER — Encounter (HOSPITAL_COMMUNITY): Payer: Self-pay | Admitting: Obstetrics and Gynecology

## 2024-10-14 ENCOUNTER — Encounter (HOSPITAL_COMMUNITY): Payer: Self-pay

## 2024-10-14 NOTE — Progress Notes (Signed)
 Surgical Instructions  Your procedure is scheduled on :  Monday February 2nd, 2026 Report to [facility] Main Entrance A at 5:30 AM, then check in the Admitting office. Any questions or running late day of surgery :  call [phone]  Questions prior to your surgery day:  call [phone], Monday -- Friday 8am - 4pm. If you experience any cold or flu symptoms such as cough, fever, chills, shortness of breath, etc. between now and you scheduled surgery, please notify your surgeon office.   Remember: Do Not eat any food after midnight the night before surgery.    This includes No water,  candy,  gum, and mints.  Take these medicines the morning of surgery with A SIPS OF WATER:  Cymbalta  and Myrbetriq .   May take these medicines IF NEEDED:  Imitrex    One week prior to surgery, STOP taking any Aspirin (unless otherwise instructed by your surgeon) Aleve, Naproxen, ibuprofen , Motrin , Advil , Goody's, BC's, all herbal medications/ supplements, fish oil, and non-prescription vitamins.  Do NOT Smoke (tobacco/ vaping) and Do Not drink alcohol for 24 hours prior to your procedure.  For those patients that use a CPAP.  Please bring your CPAP/ mask/ tubing with them day of surgery . Anesthesia may ask recovery room nurse to use and if you stay the night you be asked to use it.  You will be asked to removed any contacts, glasses, piercing's, hearing aid's, dentures/ partials prior to surgery.  Please bring cases/ container/ solution/ etc., for them day of surgery.   Patients discharged the day of surgery will NOT be allowed to drive home.  You must have responsible driver and caregiver to stay at home with you the next 24 hours.  SURGICAL WAITING ROOM VISITATION Patients may have no more than 2 support people in the waiting area - if more than 2 , these visitors may rotate.  Pre-op nurse will coordinate an appropriate time for 1 Adult support person, who may not rotate, to accompany patient in  pre-op.  Aware some patients may have certain circumstances, speak to pre-op nurse day of surgery.  Children under the age 65 must have an adult with them who is not the patient and must remain in the main waiting area with an adult.  If the patient needs to stay at the hospital during part of their recovery, the visitor guidelines for inpatient rooms apply.  Please refer to the [facility] website for the visitor guidelines for any additional information.  If you received a COVID test during your pre-op visit it is requested that you wear a mask when out in public, stay away from anyone that may not be feeling well and notify your surgeon if you develop symptoms.  If you have been in contact with anyone that has tested positive in the past 10 days notify your surgeon.     [place] - Preparing for Surgery  Before surgery, you can play an important role. Because skin is not sterile, it needs to be as free of germs as possible. You can reduce the number of germs on your skin by washing with CHG (chlorhexidine gluconate) soap before surgery. CHG is an antiseptic cleaner which kills germs and bonds with the skin to continue killing germs even after washing. Oral hygiene is also important in reducing the risk of infection. Remember to brush your teeth with your regular toothpaste the morning of surgery.  Please DO NOT use if you have an allergy to CHG or antibacterial  soaps. If your skin becomes reddened/irritated stop using the CHG and inform your Pre-op nurse day of surgery.  DO NOT shave (including legs and genital area) for at least 48 hours prior to your CHG shower.   Please follow these instructions carefully:  Shower with CHG soap the night before surgery. If you choose to wash your hair, wash your hair first as usual with your normal shampoo. After you shampoo, rinse your hair and body thoroughly to remove the shampoo. Use CHG as you would any other liquid soap. You can apply CHG  directly to the skin and wash gently with a clean washcloth or shower sponge. Apply the CHG soap to your body ONLY FROM THE NECK DOWN. Do not use on open wounds or open sores. Avoid contact with your eyes, ears, mouth, and genitals (private parts). Wash genitals (private parts) with your normal soap. Wash thoroughly, paying special attention to the area where your surgery will be performed. Thoroughly rinse your body with warm water from the neck down. DO NOT shower/wash with your normal soap after using and rinsing off the CHG soap. DO NOT use lotions, oils, etc., after showering with CHG. Pat yourself dry with a clean towel. Wear clean pajamas. Place clean sheets on your bed the night of your CHG shower and do not sleep with pets.  Day of Surgery  DO NOT Apply any lotions,  powder,  oils,  deodorants (may use underarm deodorant),  cologne/  perfumes  or makeup Do Not wear jewelry /  piercing's/  metal/  permanent jewelry must be removed prior to arrival day of surgery. (No plastic piercing) Do Not wear nail polish,  gel polish,  artificial nails, or any other type of covering on natural finger nails (toe nails are okay) Remember to brush your teeth and rinse mouth out. Put on clean / comfortable clothes. [place] is not responsible for valuables/ personal belongings

## 2024-10-14 NOTE — Progress Notes (Signed)
 Spoke w/ via phone for pre-op interview--- Rosaline Lab needs dos---- pt unable to come in for preop labs, will need to be done same day. CBC and T&S per surgeon. BMP and EKG per anesthesia.         Lab results------ COVID test -----patient states asymptomatic no test needed Arrive at -------0530 NPO after MN NO Solid Food.  Pre-Surgery Ensure or G2:  Med rec completed Medications to take morning of surgery -----Cymbalta , Myrbetriq  and Imitrex -PRN  Diabetic medication -----  GLP1 agonist last dose: GLP1 instructions:  Patient instructed no nail polish to be worn day of surgery Patient instructed to bring photo id and insurance card day of surgery Patient aware to have Driver (ride ) / caregiver    for 24 hours after surgery - Husband Dasie Conger Patient Special Instructions -----Purchase Hibiclens at any drugstore, shower night before procedure. Pre-Op special Instructions -----  Patient verbalized understanding of instructions that were given at this phone interview. Patient denies chest pain, sob, fever, cough at the interview.

## 2024-10-18 ENCOUNTER — Encounter: Payer: Self-pay | Admitting: *Deleted

## 2024-10-19 ENCOUNTER — Ambulatory Visit (HOSPITAL_COMMUNITY): Admitting: Anesthesiology

## 2024-10-19 ENCOUNTER — Encounter (HOSPITAL_COMMUNITY)
Admission: RE | Disposition: A | Discharge status: Home / Self Care | Payer: Self-pay | Source: Home / Self Care | Attending: Obstetrics and Gynecology

## 2024-10-19 ENCOUNTER — Ambulatory Visit (HOSPITAL_COMMUNITY)
Admission: RE | Admit: 2024-10-19 | Discharge: 2024-10-19 | Disposition: A | Discharge status: Home / Self Care | Attending: Obstetrics and Gynecology | Admitting: Obstetrics and Gynecology

## 2024-10-19 ENCOUNTER — Encounter (HOSPITAL_COMMUNITY): Payer: Self-pay | Admitting: Obstetrics and Gynecology

## 2024-10-19 DIAGNOSIS — F32A Depression, unspecified: Secondary | ICD-10-CM | POA: Insufficient documentation

## 2024-10-19 DIAGNOSIS — N812 Incomplete uterovaginal prolapse: Secondary | ICD-10-CM | POA: Insufficient documentation

## 2024-10-19 DIAGNOSIS — N393 Stress incontinence (female) (male): Secondary | ICD-10-CM | POA: Insufficient documentation

## 2024-10-19 DIAGNOSIS — F419 Anxiety disorder, unspecified: Secondary | ICD-10-CM | POA: Insufficient documentation

## 2024-10-19 DIAGNOSIS — N888 Other specified noninflammatory disorders of cervix uteri: Secondary | ICD-10-CM | POA: Insufficient documentation

## 2024-10-19 DIAGNOSIS — I10 Essential (primary) hypertension: Secondary | ICD-10-CM | POA: Insufficient documentation

## 2024-10-19 HISTORY — DX: Other complications of anesthesia, initial encounter: T88.59XA

## 2024-10-19 HISTORY — DX: Nausea with vomiting, unspecified: R11.2

## 2024-10-19 LAB — CBC
HCT: 36.3 % (ref 36.0–46.0)
Hemoglobin: 12.1 g/dL (ref 12.0–15.0)
MCH: 30.9 pg (ref 26.0–34.0)
MCHC: 33.3 g/dL (ref 30.0–36.0)
MCV: 92.8 fL (ref 80.0–100.0)
Platelets: 234 10*3/uL (ref 150–400)
RBC: 3.91 MIL/uL (ref 3.87–5.11)
RDW: 11.9 % (ref 11.5–15.5)
WBC: 4.5 10*3/uL (ref 4.0–10.5)
nRBC: 0 % (ref 0.0–0.2)

## 2024-10-19 LAB — BASIC METABOLIC PANEL WITH GFR
Anion gap: 10 (ref 5–15)
BUN: 12 mg/dL (ref 8–23)
CO2: 22 mmol/L (ref 22–32)
Calcium: 8.3 mg/dL — ABNORMAL LOW (ref 8.9–10.3)
Chloride: 109 mmol/L (ref 98–111)
Creatinine, Ser: 0.82 mg/dL (ref 0.44–1.00)
GFR, Estimated: >60 mL/min
Glucose, Bld: 100 mg/dL — ABNORMAL HIGH (ref 70–99)
Potassium: 3.9 mmol/L (ref 3.5–5.1)
Sodium: 141 mmol/L (ref 135–145)

## 2024-10-19 LAB — TYPE AND SCREEN
ABO/RH(D): AB POS
Antibody Screen: NEGATIVE

## 2024-10-19 LAB — ABO/RH: ABO/RH(D): AB POS

## 2024-10-19 MED ORDER — EPHEDRINE SULFATE-NACL 50-0.9 MG/10ML-% IV SOSY
PREFILLED_SYRINGE | INTRAVENOUS | Status: DC | PRN
  Administered 2024-10-19 (×3): 5 mg via INTRAVENOUS

## 2024-10-19 MED ORDER — LIDOCAINE-EPINEPHRINE 1 %-1:100000 IJ SOLN
INTRAMUSCULAR | Status: AC
  Filled 2024-10-19: qty 1

## 2024-10-19 MED ORDER — PHENYLEPHRINE 80 MCG/ML (10ML) SYRINGE FOR IV PUSH (FOR BLOOD PRESSURE SUPPORT)
PREFILLED_SYRINGE | INTRAVENOUS | Status: DC | PRN
  Administered 2024-10-19 (×4): 80 ug via INTRAVENOUS

## 2024-10-19 MED ORDER — PHENAZOPYRIDINE HCL 100 MG PO TABS
ORAL_TABLET | ORAL | Status: AC
  Filled 2024-10-19: qty 2

## 2024-10-19 MED ORDER — SODIUM CHLORIDE 0.9 % IR SOLN
Status: DC | PRN
  Administered 2024-10-19: 1000 mL via INTRAVESICAL

## 2024-10-19 MED ORDER — CHLORHEXIDINE GLUCONATE 0.12 % MT SOLN
OROMUCOSAL | Status: AC
  Filled 2024-10-19: qty 15

## 2024-10-19 MED ORDER — LACTATED RINGERS IV SOLN: INTRAVENOUS | Status: DC

## 2024-10-19 MED ORDER — ORAL CARE MOUTH RINSE: 15.0000 mL | Freq: Once | OROMUCOSAL | Status: AC

## 2024-10-19 MED ORDER — PROPOFOL 10 MG/ML IV BOLUS
INTRAVENOUS | Status: DC | PRN
  Administered 2024-10-19: 170 mg via INTRAVENOUS

## 2024-10-19 MED ORDER — OXYCODONE HCL 5 MG PO TABS: 5.0000 mg | ORAL_TABLET | Freq: Once | ORAL | Status: DC | PRN

## 2024-10-19 MED ORDER — ACETAMINOPHEN 10 MG/ML IV SOLN
INTRAVENOUS | Status: AC
  Filled 2024-10-19: qty 100

## 2024-10-19 MED ORDER — DEXAMETHASONE SOD PHOSPHATE PF 10 MG/ML IJ SOLN
INTRAMUSCULAR | Status: DC | PRN
  Administered 2024-10-19: 10 mg via INTRAVENOUS

## 2024-10-19 MED ORDER — FENTANYL CITRATE (PF) 100 MCG/2ML IJ SOLN
INTRAMUSCULAR | Status: AC
  Filled 2024-10-19: qty 2

## 2024-10-19 MED ORDER — MIDAZOLAM HCL 2 MG/2ML IJ SOLN
INTRAMUSCULAR | Status: AC
  Filled 2024-10-19: qty 2

## 2024-10-19 MED ORDER — ONDANSETRON HCL 4 MG/2ML IJ SOLN
INTRAMUSCULAR | Status: DC | PRN
  Administered 2024-10-19: 4 mg via INTRAVENOUS

## 2024-10-19 MED ORDER — DEXAMETHASONE SOD PHOSPHATE PF 10 MG/ML IJ SOLN
INTRAMUSCULAR | Status: AC
  Filled 2024-10-19: qty 1

## 2024-10-19 MED ORDER — LIDOCAINE 2% (20 MG/ML) 5 ML SYRINGE
INTRAMUSCULAR | Status: DC | PRN
  Administered 2024-10-19: 80 mg via INTRAVENOUS

## 2024-10-19 MED ORDER — PHENAZOPYRIDINE HCL 100 MG PO TABS
200.0000 mg | ORAL_TABLET | ORAL | Status: AC
  Administered 2024-10-19: 200 mg via ORAL

## 2024-10-19 MED ORDER — MIDAZOLAM HCL (PF) 2 MG/2ML IJ SOLN
INTRAMUSCULAR | Status: DC | PRN
  Administered 2024-10-19: 2 mg via INTRAVENOUS

## 2024-10-19 MED ORDER — METRONIDAZOLE 500 MG/100ML IV SOLN
500.0000 mg | INTRAVENOUS | Status: AC
  Administered 2024-10-19: 500 mg via INTRAVENOUS

## 2024-10-19 MED ORDER — 0.9 % SODIUM CHLORIDE (POUR BTL) OPTIME
TOPICAL | Status: DC | PRN
  Administered 2024-10-19: 1000 mL

## 2024-10-19 MED ORDER — LIDOCAINE-EPINEPHRINE 1 %-1:100000 IJ SOLN
INTRAMUSCULAR | Status: AC
  Filled 2024-10-19: qty 2

## 2024-10-19 MED ORDER — FENTANYL CITRATE (PF) 100 MCG/2ML IJ SOLN: 25.0000 ug | INTRAMUSCULAR | Status: DC | PRN

## 2024-10-19 MED ORDER — PROPOFOL 10 MG/ML IV BOLUS
INTRAVENOUS | Status: AC
  Filled 2024-10-19: qty 20

## 2024-10-19 MED ORDER — EPHEDRINE 5 MG/ML INJ
INTRAVENOUS | Status: AC
  Filled 2024-10-19: qty 5

## 2024-10-19 MED ORDER — LIDOCAINE-EPINEPHRINE 1 %-1:100000 IJ SOLN
INTRAMUSCULAR | Status: DC | PRN
  Administered 2024-10-19: 23 mL

## 2024-10-19 MED ORDER — ONDANSETRON HCL 4 MG/2ML IJ SOLN: 4.0000 mg | Freq: Four times a day (QID) | INTRAMUSCULAR | Status: DC | PRN

## 2024-10-19 MED ORDER — POVIDONE-IODINE 10 % EX SWAB: 2.0000 | Freq: Once | CUTANEOUS | Status: DC

## 2024-10-19 MED ORDER — ACETAMINOPHEN 10 MG/ML IV SOLN
INTRAVENOUS | Status: DC | PRN
  Administered 2024-10-19: 1000 mg via INTRAVENOUS

## 2024-10-19 MED ORDER — PROPOFOL 500 MG/50ML IV EMUL
INTRAVENOUS | Status: DC | PRN
  Administered 2024-10-19: 55 ug/kg/min via INTRAVENOUS

## 2024-10-19 MED ORDER — METRONIDAZOLE 500 MG/100ML IV SOLN
INTRAVENOUS | Status: AC
  Filled 2024-10-19: qty 100

## 2024-10-19 MED ORDER — SUGAMMADEX SODIUM 200 MG/2ML IV SOLN
INTRAVENOUS | Status: DC | PRN
  Administered 2024-10-19: 152.4 mg via INTRAVENOUS

## 2024-10-19 MED ORDER — FENTANYL CITRATE (PF) 250 MCG/5ML IJ SOLN
INTRAMUSCULAR | Status: DC | PRN
  Administered 2024-10-19 (×2): 50 ug via INTRAVENOUS

## 2024-10-19 MED ORDER — ONDANSETRON HCL 4 MG/2ML IJ SOLN
INTRAMUSCULAR | Status: AC
  Filled 2024-10-19: qty 2

## 2024-10-19 MED ORDER — CEFAZOLIN SODIUM-DEXTROSE 2-4 GM/100ML-% IV SOLN
INTRAVENOUS | Status: AC
  Filled 2024-10-19: qty 100

## 2024-10-19 MED ORDER — LIDOCAINE 2% (20 MG/ML) 5 ML SYRINGE
INTRAMUSCULAR | Status: AC
  Filled 2024-10-19: qty 5

## 2024-10-19 MED ORDER — CHLORHEXIDINE GLUCONATE 0.12 % MT SOLN
15.0000 mL | Freq: Once | OROMUCOSAL | Status: AC
  Administered 2024-10-19: 15 mL via OROMUCOSAL

## 2024-10-19 MED ORDER — CEFAZOLIN SODIUM-DEXTROSE 2-4 GM/100ML-% IV SOLN
2.0000 g | INTRAVENOUS | Status: AC
  Administered 2024-10-19: 2 g via INTRAVENOUS

## 2024-10-19 MED ORDER — ROCURONIUM BROMIDE 10 MG/ML (PF) SYRINGE
PREFILLED_SYRINGE | INTRAVENOUS | Status: AC
  Filled 2024-10-19: qty 10

## 2024-10-19 MED ORDER — ROCURONIUM BROMIDE 10 MG/ML (PF) SYRINGE
PREFILLED_SYRINGE | INTRAVENOUS | Status: DC | PRN
  Administered 2024-10-19: 10 mg via INTRAVENOUS
  Administered 2024-10-19: 60 mg via INTRAVENOUS
  Administered 2024-10-19: 10 mg via INTRAVENOUS

## 2024-10-19 MED ORDER — KETOROLAC TROMETHAMINE 30 MG/ML IJ SOLN
INTRAMUSCULAR | Status: AC
  Filled 2024-10-19: qty 1

## 2024-10-19 MED ORDER — OXYCODONE HCL 5 MG/5ML PO SOLN: 5.0000 mg | Freq: Once | ORAL | Status: DC | PRN

## 2024-10-19 NOTE — Anesthesia Procedure Notes (Signed)
 Procedure Name: Intubation Date/Time: 10/19/2024 7:46 AM  Performed by: Elly Pfeiffer, CRNAPre-anesthesia Checklist: Patient identified, Emergency Drugs available, Suction available and Patient being monitored Patient Re-evaluated:Patient Re-evaluated prior to induction Oxygen Delivery Method: Circle system utilized Preoxygenation: Pre-oxygenation with 100% oxygen Induction Type: IV induction Ventilation: Mask ventilation without difficulty Laryngoscope Size: Mac and 4 Grade View: Grade I Tube type: Oral Tube size: 7.0 mm Number of attempts: 1 Airway Equipment and Method: Stylet and Oral airway Placement Confirmation: ETT inserted through vocal cords under direct vision, positive ETCO2 and breath sounds checked- equal and bilateral Secured at: 22 cm Tube secured with: Tape Dental Injury: Teeth and Oropharynx as per pre-operative assessment  Comments: Cords clear; no trauma. CA

## 2024-10-19 NOTE — Op Note (Signed)
 Operative Note  Preoperative Diagnosis: anterior vaginal prolapse, posterior vaginal prolapse, uterovaginal prolapse, incomplete, and stress urinary incontinence  Postoperative Diagnosis: same  Procedures performed:  Total vaginal hysterectomy, bilateral salpingo-oophorectomy, uterosacral ligament suspension, anterior and posterior repair with perineorrhaphy, midurethral sling (Advantage Fit), cystoscopy  Implants:  Implant Name Type Inv. Item Serial No. Manufacturer Lot No. LRB No. Used Action  Pamela Francis [facility] - [id number] Sling SLING ADVANTAGE FIT [facility]  [employer] [id number] N/A 1 Implanted    Attending Surgeon: Rosaline Caper, MD  Assistant: Jorene Moats, PA  Anesthesia: General endotracheal  Findings: 1. On vaginal exam, stage II prolapse present. Normal appearing uterus, bilateral fallopian tubes and ovaries.   2. On cystoscopy, normal bladder and urethral mucosa without injury or lesion. Brisk bilateral ureteral efflux present.    Specimens:  ID Type Source Tests Collected by Time Destination  1 : Uterus cervix bilateral tubes and ovaries Tissue PATH Soft tissue SURGICAL PATHOLOGY Caper Rosaline SAILOR, MD 10/19/2024 0830     Estimated blood loss: 75 mL  IV fluids: 1200 mL  Urine output: 300 mL  Complications: none  Procedure in Detail:  After informed consent was obtained, the patient was taken to the operating room where she was placed under anesthesia.  She was then placed in the dorsal lithotomy position with Allen stirrups and prepped and draped in the usual sterile fashion.  Care was taken to avoid hyperflexion or hyperextension of her lower extremities.    A self-retaining retractor was placed, and a foley catheter was placed. The cervix was grasped with two tenacula.  The cervix was injected circumferentially with 1% lidocaine  with epinephrine . A 10 blade was used to incise circumferentially around the cervix.  The posterior vagina was  grasped with a Kocher clamp. The Mayo scissors were used to enter the posterior cul-de-sac. Palpation confirmed peritoneal entry and no adhesions. The posterior peritoneum was affixed to the vaginal cuff with 0-Vicryl (this suture was used throughout unless otherwise specified) at the midline. A right angle retractor was placed through the posterior colpotomy. A Heaney clamp was then used to clamp the uterosacral ligaments on each side. These were cut and suture ligated using 0-Vicryl in a Heaney fashion, and tagged with hemostats. Anteriorly, the bladder was dissected off the pubocervical fascia using Metzenbaum scissors. A Deaver retractor was placed anteriorly to protect the bladder. The anterior peritoneal reflection was then identified, tented up and incised with Metzenbaum scissors to create an anterior colpotomy. Palpation confirmed peritoneal entry and no adhesions. The Deaver was placed anteriorly to protect the bladder. The cardinal ligaments were clamped, ligated and cut with the ligasure in a similar fashion on each side. The uterine arteries were also clamped and ligated with the ligasure. The cornua were clamped, cut, free-tied and suture ligated. The uterus and cervix were handed off the field.  Inspection of the pedicles revealed excellent hemostasis.  The bowel was packed with a lap pad. The right fallopian tube and ovary was grapsed with a Babcock clamp. The IP was then clamped and ligated with a ligasure. The same was repeated on the left side. For the uterosacral ligament suspension (USLS), the bowel was further packed away with a moistened lap pad. The posterior cuff edge was grasped with an Allis clamp.  The right and left uterosacral ligaments were identified visually and digitally. Two stitches of 0 PDS was placed through each uterosacral ligament towards its insertion site at the sacrum. These were tagged.. The packing was removed.  A 70-degree cystoscope was introduced, and 360-degree  inspection revealed no trauma in the bladder, with bilateral ureteral efflux with tension on the uterosacral sutures.  The bladder was drained and the cystoscope was removed.  The Foley catheter was reinserted.    For the anterior repair, two Allis clamps were placed along the midline of the anterior vaginal wall.  1% lidocaine  with epinephrine  was injected into the vaginal mucosa.  A 15 blade scalpel was used to incise the vaginal mucosa in the midline. Allis clamps were placed along this incision and Metzenbaum scissors were used to sharply dissect the epithelium off of the vesicovaginal septum bilaterally to the level of the pubic rami. Anterior plication of the vesicovaginal septum was then performed using 2-0 Vicryl. The vaginal mucosal was reapproximated with 2-0 Vicryl in a running  fashion. Hemostasis was noted. The uterosacral stitches were then attached to the posterior and anterior edges of the vaginal cuff on the ipsilateral sides, in a through and through fashion, using a free needle. Figure of eight sutures of 0-Vicryl were placed through the vaginal cuff and tied down. The lateral uterosacral stitches were then tied down with good apical support noted. The Foley catheter was removed. A 70-degree cystoscope was introduced, and 360-degree inspection revealed no trauma in the bladder, with bilateral ureteral efflux. The cystoscope was removed. The medial uterosacral sutures were then tied down. Cystoscopy was repeated and brisk bilateral ureteral efflux was noted. The bladder was drained and the cystoscope was removed.  The Foley catheter was reinserted.  Two Allis clamps were placed at the level of the midurethra. 1% lidocaine  with epinephrine  was injected into the vaginal mucosa. A vertical incision was made between the two clamps using a 15-blade scalpel.  Using sharp dissection, Metzenbaum scissors were used to make a periurethral tunnel from the vaginal incision towards the pubic rami  bilaterally for the future sling tracts. The bladder was ensured to be empty. The trocar and attached sling were introduced into the right side of the periurethral vaginal incision, just inferior to the pubic symphysis on the right side. The trocar was guided through the endopelvic fascia and directly vertically.  While hugging the cephalad surface of the pubic bone, the trocar was guided out through the abdomen 2 fingerbreadths lateral to midline at the level of the pubic symphysis on the ipsilateral side. The trocar was placed on the left side in a similar fashion.  A 70-degree cystoscope was introduced, and 360-degree inspection revealed no trauma or trocars in the bladder, with brisk bilateral ureteral efflux.  The bladder was drained and the cystoscope was removed.  The Foley catheter was reinserted.  The sling was brought to lie beneath the mid-urethra.  A needle driver was placed behind the sling to ensure no tension.   The plastic sheath was removed from the sling and the distal ends of the sling were trimmed just below the level of the skin incisions.  Tension-free positioning of the sling was confirmed. Vaginal inspection revealed no vaginotomy or sling perforations of the mucosa.  The vaginal mucosal edges were reapproximated using 2-0 Vicryl.   Hemostasis was again noted. The suprapubic sling incisions were closed with Dermabond.   Attention was then turned to the posterior vagina.  Two Allis clamps were in the midline of the posterior vaginal wall defect.  1% lidocaine  with epinephrine  was injected into the vaginal mucosa. A vertical incision was made between these clamps with a 15 blade scalpel and a triangle shaped area  of epithelium was cut at the introitus.  The rectovaginal septum was then dissected off the vaginal mucosa bilaterally. The rectovaginal septum was then plicated with vertical mattress sutures of 2-0 Vicryl.  After placement of the first plication stitch two fingers were inserted  into the vaginal to confirm adequate caliber.  The last distal stitch incorporated the perineal body in a U stitch fashion.  After plication, the excess vaginal mucosa was trimmed and the vaginal mucosa was reapproximated using 2-0 Vicryl sutures.  The perineal body was approximated with an interrupted 0-vicryl suture. The perineal skin was closed with a 2-0 vicryl in a subcutaneous and running fashion. The vagina was copiously irrigated.  Hemostasis was noted. A rectal examination was normal and confirmed no sutures within the rectum.  The patient tolerated the procedure well.  She was awakened from anesthesia and transferred to the recovery room in stable condition. All counts were correct x 2.    [name], MD

## 2024-10-19 NOTE — Discharge Instructions (Signed)

## 2024-10-20 ENCOUNTER — Telehealth: Payer: Self-pay | Admitting: Obstetrics and Gynecology

## 2024-10-20 ENCOUNTER — Encounter (HOSPITAL_COMMUNITY): Payer: Self-pay | Admitting: Obstetrics and Gynecology

## 2024-10-20 NOTE — Telephone Encounter (Signed)
 Pamela Francis underwent Total vaginal hysterectomy, bilateral salpingo-oophorectomy, uterosacral ligament suspension, anterior and posterior repair with perineorrhaphy, midurethral sling (Advantage Fit), cystoscopy on 10/19/24.   She failed her voiding trial.  was backfilled into the bladder She was unable to void  She was discharged with a catheter. Please call her for a routine post op check and to schedule a voiding trial by Thursday 2/5. Thanks!  Pamela LOISE Caper, MD

## 2024-10-20 NOTE — Telephone Encounter (Signed)
 Pamela Francis  underwent Hysterectomy, Vaginal, With Salpingo-oophorectomy, Uterosacral Ligament Suspension, Anterior (cystocele) And Posterior Repair (rectocele), Perineorrhaphy, Suspension, Vaginal Vault, Cystoscopy, and Creation, Urethral Sling, Retropubic Approach  on 10/19/2024  with [] Dr Marilynne [] Dr Guadlupe.  The patient reports that her pain is controlled.  She is taking [] No Medication [x] Acetaminophen  500mg  every 6 hours [x] Ibuprofen  600mg  every 6 hours ,  Prescribed Narcotic only the narcotic as needed, she doesn't want to take it. Her pain level is 5 [x] with medication. She reports minimal vaginal bleeding.  The patient is tolerating PO fluids and solids. She has not had a bowel movement and is taking Miralax  for a bowel regimen. She is not passing gas.  She was discharged with a catheter.   [x] Discharged with a catheter, the patient is not having any concerns with her catheter.  She will return for a voiding trial. [] Verified scheduled date and time with patient.  ( 10-21-2024 @9am  )   She does not having any additional questions.  Reviewed Post operative instructions as needed to answer additional questions.   CC'd note to patient's provider.

## 2024-10-20 NOTE — Anesthesia Postprocedure Evaluation (Signed)
"   Anesthesia Post Note  Patient: Pamela Francis  Procedure(s) Performed: HYSTERECTOMY, VAGINAL, WITH SALPINGO-OOPHORECTOMY UTEROSACRAL LIGAMENT SUSPENSION ANTERIOR (CYSTOCELE) AND POSTERIOR REPAIR (RECTOCELE), PERINEORRHAPHY SUSPENSION, VAGINAL VAULT CYSTOSCOPY CREATION, URETHRAL SLING, RETROPUBIC APPROACH     Patient location during evaluation: PACU Anesthesia Type: General Level of consciousness: awake and alert Pain management: pain level controlled Vital Signs Assessment: post-procedure vital signs reviewed and stable Respiratory status: spontaneous breathing, nonlabored ventilation, respiratory function stable and patient connected to nasal cannula oxygen Cardiovascular status: blood pressure returned to baseline and stable Postop Assessment: no apparent nausea or vomiting Anesthetic complications: no   No notable events documented.  Last Vitals:  Vitals:   10/19/24 1130 10/19/24 1145  BP: 129/71 121/66  Pulse: 84 80  Resp: 18 15  Temp:  36.6 C  SpO2: 94% 94%    Last Pain:  Vitals:   10/19/24 1145  TempSrc:   PainSc: 0-No pain                 Juneau Doughman S      "

## 2024-10-21 ENCOUNTER — Ambulatory Visit

## 2024-10-21 DIAGNOSIS — Z48816 Encounter for surgical aftercare following surgery on the genitourinary system: Secondary | ICD-10-CM

## 2024-10-21 LAB — SURGICAL PATHOLOGY

## 2024-10-21 NOTE — Patient Instructions (Signed)
 Please call at 330 with an update.  Keep all scheduled follow ups

## 2024-10-21 NOTE — Progress Notes (Signed)
 Rosaline underwent Hysterectomy, Vaginal, With Salpingo-oophorectomy, Uterosacral Ligament Suspension, Anterior (cystocele) And Posterior Repair (rectocele), Perineorrhaphy, Suspension, Vaginal Vault, Cystoscopy, and Creation, Urethral Sling, Retropubic Approach on 10/19/2024  She presents for a voiding trial.   Patient was identified with 2 identifiers.  The patient states she does not have any concerns with the foley placed.  225 mL of NS was instilled into the bladder via a catheter.  The catheter was removed and patient was instructed to void into the urinary hat.  She voided 225 mL.  The post void residual measured by bladder scan was 18 mL.  She did pass the voiding trial.  The patient was not sent home with a catheter.    The patient received aftercare instructions and will follow up as scheduled.     Patient agrees to call at 330pm with an update.

## 2024-10-27 ENCOUNTER — Ambulatory Visit: Admitting: Neurology

## 2024-11-18 ENCOUNTER — Ambulatory Visit

## 2024-11-27 ENCOUNTER — Encounter: Admitting: Obstetrics and Gynecology
